# Patient Record
Sex: Female | Born: 1940 | Race: White | Hispanic: No | Marital: Married | Smoking: Former smoker
Health system: Southern US, Community
[De-identification: ages and names within clinical notes are randomized; demographics above are authoritative.]

## PROBLEM LIST (undated history)

## (undated) DIAGNOSIS — R12 Heartburn: Secondary | ICD-10-CM

## (undated) DIAGNOSIS — C55 Malignant neoplasm of uterus, part unspecified: Secondary | ICD-10-CM

## (undated) DIAGNOSIS — E785 Hyperlipidemia, unspecified: Secondary | ICD-10-CM

## (undated) DIAGNOSIS — K635 Polyp of colon: Secondary | ICD-10-CM

## (undated) DIAGNOSIS — G4730 Sleep apnea, unspecified: Secondary | ICD-10-CM

## (undated) DIAGNOSIS — I10 Essential (primary) hypertension: Secondary | ICD-10-CM

## (undated) DIAGNOSIS — K5790 Diverticulosis of intestine, part unspecified, without perforation or abscess without bleeding: Secondary | ICD-10-CM

## (undated) DIAGNOSIS — Z9889 Other specified postprocedural states: Secondary | ICD-10-CM

## (undated) DIAGNOSIS — R112 Nausea with vomiting, unspecified: Secondary | ICD-10-CM

## (undated) DIAGNOSIS — M1990 Unspecified osteoarthritis, unspecified site: Secondary | ICD-10-CM

## (undated) DIAGNOSIS — K259 Gastric ulcer, unspecified as acute or chronic, without hemorrhage or perforation: Secondary | ICD-10-CM

## (undated) HISTORY — PX: DUPUYTREN CONTRACTURE RELEASE: SHX1478

## (undated) HISTORY — PX: KNEE ARTHROSCOPY: SUR90

## (undated) HISTORY — PX: ABDOMINAL HYSTERECTOMY: SHX81

## (undated) HISTORY — DX: Hyperlipidemia, unspecified: E78.5

## (undated) HISTORY — DX: Malignant neoplasm of uterus, part unspecified: C55

## (undated) HISTORY — DX: Diverticulosis of intestine, part unspecified, without perforation or abscess without bleeding: K57.90

## (undated) HISTORY — PX: HEMORRHOID SURGERY: SHX153

## (undated) HISTORY — PX: COLONOSCOPY W/ POLYPECTOMY: SHX1380

## (undated) HISTORY — DX: Gastric ulcer, unspecified as acute or chronic, without hemorrhage or perforation: K25.9

## (undated) HISTORY — DX: Polyp of colon: K63.5

---

## 1993-06-20 DIAGNOSIS — C55 Malignant neoplasm of uterus, part unspecified: Secondary | ICD-10-CM

## 1993-06-20 HISTORY — DX: Malignant neoplasm of uterus, part unspecified: C55

## 1999-02-11 ENCOUNTER — Other Ambulatory Visit: Admission: RE | Admit: 1999-02-11 | Discharge: 1999-02-11 | Payer: Self-pay | Admitting: *Deleted

## 1999-11-29 ENCOUNTER — Other Ambulatory Visit: Admission: RE | Admit: 1999-11-29 | Discharge: 1999-11-29 | Payer: Self-pay | Admitting: *Deleted

## 2001-04-09 ENCOUNTER — Other Ambulatory Visit: Admission: RE | Admit: 2001-04-09 | Discharge: 2001-04-09 | Payer: Self-pay | Admitting: *Deleted

## 2001-07-26 ENCOUNTER — Ambulatory Visit (HOSPITAL_BASED_OUTPATIENT_CLINIC_OR_DEPARTMENT_OTHER): Admission: RE | Admit: 2001-07-26 | Discharge: 2001-07-26 | Payer: Self-pay | Admitting: Otolaryngology

## 2001-11-20 ENCOUNTER — Ambulatory Visit (HOSPITAL_BASED_OUTPATIENT_CLINIC_OR_DEPARTMENT_OTHER): Admission: RE | Admit: 2001-11-20 | Discharge: 2001-11-20 | Payer: Self-pay | Admitting: Otolaryngology

## 2001-12-14 ENCOUNTER — Encounter (INDEPENDENT_AMBULATORY_CARE_PROVIDER_SITE_OTHER): Payer: Self-pay | Admitting: Specialist

## 2001-12-14 ENCOUNTER — Ambulatory Visit (HOSPITAL_COMMUNITY): Admission: RE | Admit: 2001-12-14 | Discharge: 2001-12-14 | Payer: Self-pay | Admitting: Gastroenterology

## 2002-04-03 ENCOUNTER — Other Ambulatory Visit: Admission: RE | Admit: 2002-04-03 | Discharge: 2002-04-03 | Payer: Self-pay | Admitting: *Deleted

## 2002-10-03 ENCOUNTER — Encounter: Admission: RE | Admit: 2002-10-03 | Discharge: 2002-10-03 | Payer: Self-pay | Admitting: Chiropractic Medicine

## 2002-10-03 ENCOUNTER — Encounter: Payer: Self-pay | Admitting: Chiropractic Medicine

## 2006-04-26 ENCOUNTER — Ambulatory Visit: Payer: Self-pay | Admitting: Gastroenterology

## 2006-09-05 ENCOUNTER — Other Ambulatory Visit: Admission: RE | Admit: 2006-09-05 | Discharge: 2006-09-05 | Payer: Self-pay | Admitting: Internal Medicine

## 2009-11-05 ENCOUNTER — Encounter (INDEPENDENT_AMBULATORY_CARE_PROVIDER_SITE_OTHER): Payer: Self-pay | Admitting: *Deleted

## 2009-11-26 ENCOUNTER — Encounter (INDEPENDENT_AMBULATORY_CARE_PROVIDER_SITE_OTHER): Payer: Self-pay

## 2009-12-01 ENCOUNTER — Ambulatory Visit: Payer: Self-pay | Admitting: Gastroenterology

## 2009-12-08 ENCOUNTER — Ambulatory Visit: Payer: Self-pay | Admitting: Gastroenterology

## 2010-07-20 NOTE — Letter (Signed)
Summary: Previsit letter  [facility]  [address] [place], [place] [address]   Phone: [phone]  Fax: [phone]       11/05/2009 MRN: [id number]  Rhonda Gregory [address] [place], [place]  [address]  Dear [name],  Welcome to the Gastroenterology Division at [facility].    You are scheduled to see a nurse for your pre-procedure visit on 11-23-09 at 11am on the 3rd floor at [facility], [address]  We ask that you try to arrive at our office 15 minutes prior to your appointment time to allow for check-in.  Your nurse visit will consist of discussing your medical and surgical history, your immediate family medical history, and your medications.    Please bring a complete list of all your medications or, if you prefer, bring the medication bottles and we will list them.  We will need to be aware of both prescribed and over the counter drugs.  We will need to know exact dosage information as well.  If you are on blood thinners (Coumadin, Plavix, Aggrenox, Ticlid, etc.) please call our office today/prior to your appointment, as we need to consult with your physician about holding your medication.   Please be prepared to read and sign documents such as consent forms, a financial agreement, and acknowledgement forms.  If necessary, and with your consent, a friend or relative is welcome to sit-in on the nurse visit with you.  Please bring your insurance card so that we may make a copy of it.  If your insurance requires a referral to see a specialist, please bring your referral form from your primary care physician.  No co-pay is required for this nurse visit.     If you cannot keep your appointment, please call [phone] to cancel or reschedule prior to your appointment date.  This allows [place] the opportunity to schedule an appointment for another patient in need of care.    Thank you for choosing [facility] for your medical needs.  We  appreciate the opportunity to care for you.  Please visit [place] at our website  to learn more about our practice.                     Sincerely.                                                                                                                   The Gastroenterology Division

## 2010-07-20 NOTE — Procedures (Signed)
Summary: Colonoscopy  Patient: Rhonda Gregory Note: All result statuses are Final unless otherwise noted.  Tests: (1) Colonoscopy (COL)   COL Colonoscopy           DONE     [facility]     [address]     [place], [place]  [address]           COLONOSCOPY PROCEDURE REPORT           PATIENT:  Rhonda Gregory, Rhonda Gregory  MR#:  [id number]     BIRTHDATE:  08-12-1940, 68 yrs. old  GENDER:  female     ENDOSCOPIST:  Vania Rea. Jarold Motto, MD, [facility]     REF. BY:     PROCEDURE DATE:  12/08/2009     PROCEDURE:  Higher-risk screening colonoscopy G0105           ASA CLASS:  Class II     INDICATIONS:  colorectal cancer screening, average risk, family     history of colon cancer     MEDICATIONS:   Zofran 4 mg IV, Fentanyl 50 mcg IV, Versed 4 mg IV           DESCRIPTION OF PROCEDURE:   After the risks benefits and     alternatives of the procedure were thoroughly explained, informed     consent was obtained.  Digital rectal exam was performed and     revealed no abnormalities.   The LB CF-[id number] endoscope     was introduced through the anus and advanced to the cecum, which     was identified by both the appendix and ileocecal valve, without     limitations.  The quality of the prep was excellent, using     MoviPrep.  The instrument was then slowly withdrawn as the colon     was fully examined.     <<PROCEDUREIMAGES>>           FINDINGS:  Moderate diverticulosis was found in the sigmoid to     descending colon segments.  No polyps or cancers were seen.  This     was otherwise a normal examination of the colon.   Retroflexed     views in the rectum revealed no abnormalities.    The scope was     then withdrawn from the patient and the procedure completed.           COMPLICATIONS:  None     ENDOSCOPIC IMPRESSION:     1) Moderate diverticulosis in the sigmoid to descending colon     segments     2) No polyps or cancers     3) Otherwise normal examination     RECOMMENDATIONS:     1) high  fiber diet     2) Repeat Colonoscopy in 5 years.     REPEAT EXAM:  No           ______________________________     Vania Rea. Jarold Motto, MD, [facility]           CC:           n.     eSIGNED:   Vania Rea. Patterson at 12/08/2009 12:39 PM           Joanette Gula, [id number]  Note: An exclamation mark (!) indicates a result that was not dispersed into the flowsheet. Document Creation Date: 12/08/2009 12:40 PM _______________________________________________________________________  (1) Order result status: Final Collection or observation date-time: 12/08/2009 12:33 Requested date-time:  Receipt date-time:  Reported  date-time:  Referring Physician:   Ordering Physician: Sheryn Bison [phone]) Specimen Source:  Source: Launa Grill Order Number: [id number] Lab site:   Appended Document: Colonoscopy    Clinical Lists Changes  Observations: Added new observation of COLONNXTDUE: 11/2014 (12/08/2009 14:16)

## 2010-07-20 NOTE — Miscellaneous (Signed)
Summary: Lec previsit  Clinical Lists Changes  Medications: Added new medication of MOVIPREP 100 GM  SOLR (PEG-KCL-NACL-NASULF-NA ASC-C) As per prep instructions. - Signed Rx of MOVIPREP 100 GM  SOLR (PEG-KCL-NACL-NASULF-NA ASC-C) As per prep instructions.;  #1 x 0;  Signed;  Entered by: Ulis Rias RN;  Authorized by: Mardella Layman MD [facility];  Method used: Electronically to [facility] [facility] [id number]*, [address] [place], [place], [place]  [address], Ph: [phone] or [phone], Fax: [phone] Allergies: Added new allergy or adverse reaction of CODEINE Observations: Added new observation of NKA: F (12/01/2009 7:51)    Prescriptions: MOVIPREP 100 GM  SOLR (PEG-KCL-NACL-NASULF-NA ASC-C) As per prep instructions.  #1 x 0   Entered by:   Ulis Rias RN   Authorized by:   Mardella Layman MD [facility]   Signed by:   Ulis Rias RN on 12/01/2009   Method used:   Electronically to        [facility] [address] [id number]* (retail)       [address]       [place], [place]  [address]       Ph: [phone] or [phone]       Fax: [phone]   RxID:   [id number]

## 2010-07-20 NOTE — Letter (Signed)
Summary: [facility] Instructions  [facility]  [address] [place], [place] [address]   Phone: [phone]  Fax: [phone]       Rhonda Gregory    Mar 10, 1941    MRN: [id number]        Procedure Day Dorna Bloom:  Jake Shark  12/08/09     Arrival Time:  10:30AM     Procedure Time:  11:30AM     Location of Procedure:                    _ X_  [facility] (4th Floor)                       PREPARATION FOR COLONOSCOPY WITH MOVIPREP   Starting 5 days prior to your procedure 12/03/09 do not eat nuts, seeds, popcorn, corn, beans, peas,  salads, or any raw vegetables.  Do not take any fiber supplements (e.g. Metamucil, Citrucel, and Benefiber).  THE DAY BEFORE YOUR PROCEDURE         DATE: 12/07/09  DAY: MONDAY  1.  Drink clear liquids the entire day-NO SOLID FOOD  2.  Do not drink anything colored red or purple.  Avoid juices with pulp.  No orange juice.  3.  Drink at least 64 oz. (8 glasses) of fluid/clear liquids during the day to prevent dehydration and help the prep work efficiently.  CLEAR LIQUIDS INCLUDE: Water Jello Ice Popsicles Tea (sugar ok, no milk/cream) Powdered fruit flavored drinks Coffee (sugar ok, no milk/cream) Gatorade Juice: apple, white grape, white cranberry  Lemonade Clear bullion, consomm, broth Carbonated beverages (any kind) Strained chicken noodle soup Hard Candy                             4.  In the morning, mix first dose of MoviPrep solution:    Empty 1 Pouch A and 1 Pouch B into the disposable container    Add lukewarm drinking water to the top line of the container. Mix to dissolve    Refrigerate (mixed solution should be used within 24 hrs)  5.  Begin drinking the prep at 5:00 p.m. The MoviPrep container is divided by 4 marks.   Every 15 minutes drink the solution down to the next mark (approximately 8 oz) until the full liter is complete.   6.  Follow completed prep with 16 oz of clear liquid of your choice (Nothing  red or purple).  Continue to drink clear liquids until bedtime.  7.  Before going to bed, mix second dose of MoviPrep solution:    Empty 1 Pouch A and 1 Pouch B into the disposable container    Add lukewarm drinking water to the top line of the container. Mix to dissolve    Refrigerate  THE DAY OF YOUR PROCEDURE      DATE: 12/08/09   DAY: TUESDAY  Beginning at 6:30AM (5 hours before procedure):         1. Every 15 minutes, drink the solution down to the next mark (approx 8 oz) until the full liter is complete.  2. Follow completed prep with 16 oz. of clear liquid of your choice.    3. You may drink clear liquids until 9:30AM (2 HOURS BEFORE PROCEDURE).   MEDICATION INSTRUCTIONS  Unless otherwise instructed, you should take regular prescription medications with a small sip of water   as early as possible the morning  of your procedure.         OTHER INSTRUCTIONS  You will need a responsible adult at least 70 years of age to accompany you and drive you home.   This person must remain in the waiting room during your procedure.  Wear loose fitting clothing that is easily removed.  Leave jewelry and other valuables at home.  However, you may wish to bring a book to read or  an iPod/MP3 player to listen to music as you wait for your procedure to start.  Remove all body piercing jewelry and leave at home.  Total time from sign-in until discharge is approximately 2-3 hours.  You should go home directly after your procedure and rest.  You can resume normal activities the  day after your procedure.  The day of your procedure you should not:   Drive   Make legal decisions   Operate machinery   Drink alcohol   Return to work  You will receive specific instructions about eating, activities and medications before you leave.    The above instructions have been reviewed and explained to me by   Ulis Rias RN  December 01, 2009 8:13 AM     I fully understand and can  verbalize these instructions _____________________________ Date _________

## 2010-11-05 NOTE — Op Note (Signed)
[place]  Patient:    Rhonda Gregory, Rhonda Gregory Visit Number: [id number] MRN: [id number]          Service Type: DSU Location: [facility] Attending Physician:  Corie Chiquito Dictated by:   Margit Banda. Jearld Fenton, M.D. Proc. Date: 07/26/01 Admit Date:  07/26/2001 Discharge Date: 07/26/2001                             Operative Report  PREOPERATIVE DIAGNOSIS:  Deviated septum and turbinate hypertrophy.  POSTOPERATIVE DIAGNOSIS:  Deviated septum and turbinate hypertrophy.  SURGICAL PROCEDURE:  Septoplasty and submucous resection or inferior turbinates.  ANESTHESIA:  General endotracheal tube.  ESTIMATED BLOOD LOSS:  Approximately 25 cc.  INDICATIONS:  This is a 70 year old, who has had a chronic nasal obstruction problem that has been going on for many years.  She has tried nasal steroid sprays, which have not significantly improved her symptoms.  She had a nasal fracture injury in October 2002 and after that, her nose became more obstructed.  She now wants to proceed with a septoplasty and turbinate reduction.  She was informed of the risks and benefits of the procedure, including bleeding, infection, chronic crusting and drying, perforation, numbness of her teeth, change in the external appearance of the nose, and risk of the anesthetic.  All questions were answered and consent was obtained.  DESCRIPTION OF PROCEDURE:  The patient was taken to the operating room and placed in supine position.  After adequate general endotracheal tube anesthesia, was placed in a supine position, prepped and draped in the usual sterile manner.  The left hemitransfixion incision was performed raising a mucoperichondrial and ostial flap.  The septum was substantially deviated to the left.  In fact, the septum was actually scarred to the left lateral aspect of the nasal cavity.  The flap was elevated carefully and did put a rent in the mucosal flap posteriorly, where the scar  tissue was on the lateral wall. This was broken apart.  This was posterior to the middle turbinate.  The cartilage was divided about 2 cm posterior to the caudal strut and this cartilage was removed with a Therapist, nutritional.  The inferior cartilaginous spurs were removed with the Therapist, nutritional.  The bone was removed with the Jansen-Middleton forceps.  This appeared to correct the septal deflection very nicely.  The turbinates were then in-fractured and midline incision was made with a mucosal flap elevated superiorly.  This was performed with a Therapist, nutritional.  The inferior mucosa and bone were removed with turbinate scissors. The edge was cauterized with suction cautery.  The flap was laid back down over the raw surface and the turbinates were out-fractured bilaterally.  The hemitransfixion incision was closed with interrupted 4-0 chromic and  4-0 plain gut quilting stitch placed through the septum.  Telfa rolled soaked in bacitracin were placed into the nose bilaterally and secured with a 3-0 nylon. The nasopharynx was suctioned out of all blood and debris prior to this insertion.  The patient was awakened and brought to recovery room in stable condition.  Counts correct. Dictated by:   Margit Banda. Jearld Fenton, M.D. Attending Physician:  Corie Chiquito DD:  07/26/01 TD:  07/26/01 Job: [id number]

## 2010-11-05 NOTE — Assessment & Plan Note (Signed)
[name]                           GASTROENTEROLOGY OFFICE NOTE   Rhonda Gregory Rhonda Gregory                        MRN:          [id number]  DATE:04/26/2006                            DOB:          [birth year]    Patient comes in complaining of stomach pain, especially when she is hungry.  She says she keeps eating, says because it seems to alleviate her symptoms.  She says it is epigastric pain.  She does take some Rolaids, which has  helped a little.  She says she has been watching her diet, but it still has  not improved.  It is interesting, she says was on some Flexeril for several  months during the summer, taking it every day for muscle cramps and pains.  She also does not notice any pain after eating, no belching or burping as  one would suspect from gallbladder disease.  Her only other problem has been  history of some ulcers and colon polyps, and some hemorrhoids.  No other  problems at this time.  She has had an upper endoscopic examination by  myself, she says over 20 years ago, and colonoscopic examination  approximately 3-1/2 years ago by Dr. Kinnie Gregory.  This showed a few small  polyps.  It should be noted that her mother underwent this and had colon  cancer and colon polyps.  She also had ulcerative colitis.   PAST MEDICAL HISTORY:  Reveals some hypertension, uterine cancer.  She does  have sleep apnea.  She has had M-mode surgery and hysterectomy.   SOCIAL HISTORY:  Essentially noncontributory except that she does drink  occasionally.  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   FAMILY HISTORY:  Otherwise, noncontributory.  Mother did have a bleeding  disorder, and her father heart disease and diabetes.  Mother had breast  cancer as well as colon cancer.   REVIEW OF SYMPTOMS:  Reveals some back pain and arthritic pain at times.   PHYSICAL EXAMINATION:  Healthy-appearing lady, 5 foot 2 inches.  Weighs 149.  Blood  pressure 122/64.  Pulse of 78 and regular.  Oropharynx negative.  NECK:  Negative.  CHEST:  Clear.  HEART:  Reveals a regular rhythm without significant murmur.  Supraclavicular area was negative.  Thyroid was negative to palpation.  ABDOMEN:  Soft.  Flat.  No masses or organomegaly, and non-tender.  There  was no bruits or rubs.  There were no inguinal nodes.   IMPRESSION:  1. Epigastric pain, questionable etiology, possibly peptic ulcer disease.  2. History of diverticular disease.  3. Hypertension.  4. Status post uterine cancer.  5. Status post hysterectomy.  6. Status post hemorrhoidectomy.   RECOMMENDATIONS:  1. She will be started on Prevacid 30 mg b.i.d.  2. Get labs as per Dr. Elisabeth Gregory.  3. If she is not any better over the next 10 days to 2 weeks, I think an      ultrasound of her abdomen would be in order, although this is unlikely      that she has any gallbladder disease or  any other problem besides the      suspected peptic ulcer disease or gastritis.  I think that upper      endoscopy would be in order if her symptoms persisted.  Hopefully, this      will not be necessary.     Rhonda Mort, MD  Electronically Signed    SML/MedQ  DD: 04/26/2006  DT: 04/27/2006  Job #: [id number]   cc:   Rhonda Gregory, D.O.

## 2010-12-01 ENCOUNTER — Other Ambulatory Visit (HOSPITAL_COMMUNITY): Payer: Self-pay | Admitting: Internal Medicine

## 2010-12-01 ENCOUNTER — Ambulatory Visit (HOSPITAL_COMMUNITY)
Admission: RE | Admit: 2010-12-01 | Discharge: 2010-12-01 | Disposition: A | Discharge status: Home / Self Care | Payer: Medicare Other | Source: Ambulatory Visit | Attending: Internal Medicine | Admitting: Internal Medicine

## 2010-12-01 DIAGNOSIS — I10 Essential (primary) hypertension: Secondary | ICD-10-CM | POA: Insufficient documentation

## 2010-12-01 DIAGNOSIS — Z0000 Encounter for general adult medical examination without abnormal findings: Secondary | ICD-10-CM | POA: Insufficient documentation

## 2012-06-25 ENCOUNTER — Ambulatory Visit: Payer: Self-pay | Admitting: Physical Therapy

## 2012-06-25 ENCOUNTER — Ambulatory Visit: Payer: Medicare Other | Attending: Orthopedic Surgery | Admitting: Physical Therapy

## 2012-06-25 DIAGNOSIS — M25569 Pain in unspecified knee: Secondary | ICD-10-CM | POA: Insufficient documentation

## 2012-06-25 DIAGNOSIS — IMO0001 Reserved for inherently not codable concepts without codable children: Secondary | ICD-10-CM | POA: Insufficient documentation

## 2012-06-28 ENCOUNTER — Ambulatory Visit: Payer: Medicare Other | Admitting: Physical Therapy

## 2012-06-28 ENCOUNTER — Encounter: Payer: Self-pay | Admitting: Physical Therapy

## 2012-07-02 ENCOUNTER — Ambulatory Visit: Payer: Medicare Other | Admitting: Physical Therapy

## 2012-07-02 ENCOUNTER — Encounter: Payer: Self-pay | Admitting: Physical Therapy

## 2013-07-24 ENCOUNTER — Other Ambulatory Visit: Payer: Self-pay | Admitting: Surgical

## 2013-07-24 NOTE — H&P (Signed)
TOTAL KNEE ADMISSION H&P  Patient is being admitted for right total knee arthroplasty.  Subjective:  Chief Complaint:right knee pain.  HPI: Rhonda Gregory, 73 y.o. female, has a history of pain and functional disability in the right knee due to arthritis and has failed non-surgical conservative treatments for greater than 12 weeks to includeNSAID's and/or analgesics, corticosteriod injections, viscosupplementation injections and activity modification.  Onset of symptoms was gradual, starting >10 years ago with gradually worsening course since that time. The patient noted prior procedures on the knee to include  arthroscopy and menisectomy on the right knee(s).  Patient currently rates pain in the right knee(s) at 7 out of 10 with activity. Patient has night pain, worsening of pain with activity and weight bearing, pain that interferes with activities of daily living, pain with passive range of motion, crepitus and joint swelling.  Patient has evidence of subchondral cysts, subchondral sclerosis, periarticular osteophytes, joint subluxation and joint space narrowing by imaging studies.  There is no active infection.  Past Medical History Sleep Apnea HTN Measles Uterine cancer Osteoarthritis  Past Surgical History Hysterectomy. complete (cancerous) Straighten Nasal Septum Arthroscopy of Knee. right Hemorrhoidectomy Foot Surgery. left  Medication History Lisinopril-Hydrochlorothiazide (20-12.5MG  Tablet, Oral) Active. Simvastatin (20MG  Tablet, Oral) Active. Zolpidem Tartrate (10MG  Tablet, Oral) Active. (only on occasion) Meloxicam (7.5MG  Tablet, Oral) Active.   Allergies  Allergen Reactions  . Codeine     REACTION: nauseated    History  Substance Use Topics  . Smoking status: Not on file  . Smokeless tobacco: Not on file  . Alcohol Use: Not on file    Family History Hypertension. father and brother Heart disease in female family member before age 36 Heart Disease.  father Congestive Heart Failure. father and grandfather mothers side Cancer. mother and grandmother mothers side Drug / Alcohol Addiction. mother Depression. mother and grandmother mothers side   Review of Systems  Constitutional: Negative.   HENT: Negative.   Eyes: Negative.   Respiratory: Positive for cough. Negative for hemoptysis, sputum production, shortness of breath and wheezing.   Cardiovascular: Negative.   Gastrointestinal: Positive for diarrhea. Negative for heartburn, nausea, vomiting, abdominal pain, constipation, blood in stool and melena.  Genitourinary: Negative.   Musculoskeletal: Positive for joint pain. Negative for back pain, falls, myalgias and neck pain.       Right knee pain  Skin: Negative.   Neurological: Negative.   Endo/Heme/Allergies: Negative.   Psychiatric/Behavioral: Negative.     Objective:  Physical Exam  Constitutional: She is oriented to person, place, and time. She appears well-developed and well-nourished. No distress.  HENT:  Head: Normocephalic and atraumatic.  Right Ear: External ear normal.  Left Ear: External ear normal.  Nose: Nose normal.  Mouth/Throat: Oropharynx is clear and moist.  Eyes: Conjunctivae and EOM are normal.  Neck: Normal range of motion. Neck supple.  Cardiovascular: Normal rate, regular rhythm, normal heart sounds and intact distal pulses.   Respiratory: Effort normal and breath sounds normal. No respiratory distress. She has no wheezes.  GI: Soft. Bowel sounds are normal. She exhibits no distension. There is no tenderness.  Musculoskeletal:       Right hip: Normal.       Left hip: Normal.       Right knee: She exhibits decreased range of motion and swelling. She exhibits no effusion and no erythema. Tenderness found. Medial joint line and lateral joint line tenderness noted.       Left knee: She exhibits normal range of motion,  no swelling and no erythema. Tenderness found. Medial joint line tenderness noted.  No lateral joint line tenderness noted.       Right lower leg: She exhibits no tenderness and no swelling.       Left lower leg: She exhibits no tenderness and no swelling.   Her right knee shows no effusion. Range is about 5 to 125 with marked crepitus on range of motion. She is tender medial greater than lateral with no instability.  Neurological: She is alert and oriented to person, place, and time. She has normal strength and normal reflexes. No sensory deficit.  Skin: No rash noted. She is not diaphoretic. No erythema.  Psychiatric: She has a normal mood and affect. Her behavior is normal.     Vitals Weight: 147 lb Height: 61 in Body Surface Area: 1.69 m Body Mass Index: 27.78 kg/m Pulse: 80 (Regular) BP: 112/74 (Sitting, Left Arm, Standard)  Imaging Review Plain radiographs demonstrate severe degenerative joint disease of the right knee(s). The overall alignment ismild varus. The bone quality appears to be good for age and reported activity level.  Assessment/Plan:  End stage arthritis, right knee   The patient history, physical examination, clinical judgment of the provider and imaging studies are consistent with end stage degenerative joint disease of the right knee(s) and total knee arthroplasty is deemed medically necessary. The treatment options including medical management, injection therapy arthroscopy and arthroplasty were discussed at length. The risks and benefits of total knee arthroplasty were presented and reviewed. The risks due to aseptic loosening, infection, stiffness, patella tracking problems, thromboembolic complications and other imponderables were discussed. The patient acknowledged the explanation, agreed to proceed with the plan and consent was signed. Patient is being admitted for inpatient treatment for surgery, pain control, PT, OT, prophylactic antibiotics, VTE prophylaxis, progressive ambulation and ADL's and discharge planning. The patient is  planning to be discharged home with home health services    [place], [place]

## 2013-08-02 ENCOUNTER — Other Ambulatory Visit: Payer: Self-pay | Admitting: Orthopedic Surgery

## 2013-08-05 ENCOUNTER — Encounter (HOSPITAL_COMMUNITY): Payer: Self-pay | Admitting: Pharmacy Technician

## 2013-08-07 ENCOUNTER — Other Ambulatory Visit (HOSPITAL_COMMUNITY): Payer: Self-pay | Admitting: Orthopedic Surgery

## 2013-08-07 NOTE — Progress Notes (Signed)
EKG 2/15 chart

## 2013-08-07 NOTE — Patient Instructions (Signed)
      Your procedure is scheduled on: 08/12/13  MONDAY   Report to [facility] at   [place]    AM.  Call this number if you have problems the morning of surgery: [place]   Do not eat food After Midnight. Sunday NIGHT-- MAY HAVE CLEAR LIQUIDS Monday MORNING UNTIL 0640 AM-- THEN NOTHING BY MOUTH   Take these medicines the morning of surgery with A SIP OF WATER: NONE   .  Contacts, dentures or partial plates, or metal hairpins  can not be worn to surgery. Your family will be responsible for glasses, dentures, hearing aides while you are in surgery  Leave suitcase in the car. After surgery it may be brought to your room.  For patients admitted to the hospital, checkout time is 11:00 AM day of  discharge.                DO NOT WEAR JEWELRY, LOTIONS, POWDERS, OR PERFUMES.  WOMEN-- DO NOT SHAVE LEGS OR UNDERARMS FOR 48 HOURS BEFORE SHOWERS. MEN MAY SHAVE FACE.  Patients discharged the day of surgery will not be allowed to drive home. IF going home the day of surgery, you must have a driver and someone to stay with you for the first 24 hours  Name and phone number of your driver:    ADMISSION                                                                    Please read over the following fact sheets that you were given: MRSA Information, Incentive Spirometry Sheet, Blood Transfusion Sheet  Information                     FAILURE TO [place]                                                  Patient Signature _____________________________

## 2013-08-08 ENCOUNTER — Other Ambulatory Visit: Payer: Self-pay | Admitting: Surgical

## 2013-08-08 ENCOUNTER — Encounter (HOSPITAL_COMMUNITY): Payer: Self-pay

## 2013-08-08 ENCOUNTER — Encounter (HOSPITAL_COMMUNITY)
Admission: RE | Admit: 2013-08-08 | Discharge: 2013-08-08 | Disposition: A | Discharge status: Home / Self Care | Payer: Medicare HMO | Source: Ambulatory Visit | Attending: Orthopedic Surgery | Admitting: Orthopedic Surgery

## 2013-08-08 ENCOUNTER — Ambulatory Visit (HOSPITAL_COMMUNITY)
Admission: RE | Admit: 2013-08-08 | Discharge: 2013-08-08 | Disposition: A | Discharge status: Home / Self Care | Payer: Medicare HMO | Source: Ambulatory Visit | Attending: Orthopedic Surgery | Admitting: Orthopedic Surgery

## 2013-08-08 DIAGNOSIS — Z01812 Encounter for preprocedural laboratory examination: Secondary | ICD-10-CM | POA: Insufficient documentation

## 2013-08-08 DIAGNOSIS — M25519 Pain in unspecified shoulder: Secondary | ICD-10-CM | POA: Insufficient documentation

## 2013-08-08 DIAGNOSIS — R079 Chest pain, unspecified: Secondary | ICD-10-CM | POA: Insufficient documentation

## 2013-08-08 DIAGNOSIS — W19XXXA Unspecified fall, initial encounter: Secondary | ICD-10-CM | POA: Insufficient documentation

## 2013-08-08 HISTORY — DX: Heartburn: R12

## 2013-08-08 HISTORY — DX: Other specified postprocedural states: Z98.890

## 2013-08-08 HISTORY — DX: Other specified postprocedural states: R11.2

## 2013-08-08 HISTORY — DX: Essential (primary) hypertension: I10

## 2013-08-08 HISTORY — DX: Sleep apnea, unspecified: G47.30

## 2013-08-08 HISTORY — DX: Unspecified osteoarthritis, unspecified site: M19.90

## 2013-08-08 LAB — CBC
HCT: 41.6 % (ref 36.0–46.0)
Hemoglobin: 13.5 g/dL (ref 12.0–15.0)
MCH: 29.5 pg (ref 26.0–34.0)
MCHC: 32.5 g/dL (ref 30.0–36.0)
MCV: 90.8 fL (ref 78.0–100.0)
PLATELETS: 268 10*3/uL (ref 150–400)
RBC: 4.58 MIL/uL (ref 3.87–5.11)
RDW: 13.1 % (ref 11.5–15.5)
WBC: 11.1 10*3/uL — AB (ref 4.0–10.5)

## 2013-08-08 LAB — SURGICAL PCR SCREEN
MRSA, PCR: NEGATIVE
Staphylococcus aureus: NEGATIVE

## 2013-08-08 LAB — COMPREHENSIVE METABOLIC PANEL
ALBUMIN: 4.3 g/dL (ref 3.5–5.2)
ALK PHOS: 92 U/L (ref 39–117)
ALT: 29 U/L (ref 0–35)
AST: 24 U/L (ref 0–37)
BUN: 23 mg/dL (ref 6–23)
CHLORIDE: 99 meq/L (ref 96–112)
CO2: 28 mEq/L (ref 19–32)
CREATININE: 0.67 mg/dL (ref 0.50–1.10)
Calcium: 10.7 mg/dL — ABNORMAL HIGH (ref 8.4–10.5)
GFR calc Af Amer: >90 mL/min (ref >90)
GFR calc non Af Amer: 86 mL/min — ABNORMAL LOW (ref >90)
Glucose, Bld: 89 mg/dL (ref 70–99)
POTASSIUM: 4.1 meq/L (ref 3.7–5.3)
SODIUM: 140 meq/L (ref 137–147)
Total Bilirubin: 0.2 mg/dL — ABNORMAL LOW (ref 0.3–1.2)
Total Protein: 8 g/dL (ref 6.0–8.3)

## 2013-08-08 LAB — PROTIME-INR
INR: 0.91 (ref 0.00–1.49)
Prothrombin Time: 12.1 seconds (ref 11.6–15.2)

## 2013-08-08 LAB — URINALYSIS, ROUTINE W REFLEX MICROSCOPIC
Bilirubin Urine: NEGATIVE
Glucose, UA: NEGATIVE mg/dL
Hgb urine dipstick: NEGATIVE
KETONES UR: NEGATIVE mg/dL
LEUKOCYTES UA: NEGATIVE
NITRITE: NEGATIVE
Protein, ur: NEGATIVE mg/dL
Specific Gravity, Urine: 1.024 (ref 1.005–1.030)
Urobilinogen, UA: 0.2 mg/dL (ref 0.0–1.0)
pH: 5.5 (ref 5.0–8.0)

## 2013-08-08 LAB — ABO/RH: ABO/RH(D): B POS

## 2013-08-08 LAB — APTT: APTT: 27 s (ref 24–37)

## 2013-08-08 NOTE — Progress Notes (Signed)
Late note for PST VISIT-  C/o left shoulder, neck, upper arm pain following fall 1 1/2 weeks ago in [place]. States fell on concrete. Old bruising noted anterior chest only. Able to raise arm to head- states is very tender. Used ice this am.  States began yesterday and she called Dr Tresa Moore office. She and husband want x ray done at PST inspite of my instructions that I can not do this without order, and she may need to be seen by PCP, and then if abnormal, let Dr Wynelle Link know for follow up.  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 Explained to him after I evaluated his wife, and obtained some history from her(as no history in EPIC other than meds) that I will take care of his wife.  After speaking with Faith at Dr Tresa Moore office, she stated that Dr Wynelle Link had received the message and a shoulder x ray can be done today and order will be placed.  Husband and wife appeared happier after finding out x ray would be done.

## 2013-08-08 NOTE — Progress Notes (Signed)
OV with clearance Dr Virgina Jock 2/15 chart

## 2013-08-12 ENCOUNTER — Inpatient Hospital Stay (HOSPITAL_COMMUNITY): Payer: Medicare HMO | Admitting: Anesthesiology

## 2013-08-12 ENCOUNTER — Encounter (HOSPITAL_COMMUNITY)
Admission: RE | Disposition: A | Discharge status: Home Health | Payer: Self-pay | Source: Ambulatory Visit | Attending: Orthopedic Surgery

## 2013-08-12 ENCOUNTER — Encounter (HOSPITAL_COMMUNITY): Payer: Self-pay

## 2013-08-12 ENCOUNTER — Inpatient Hospital Stay (HOSPITAL_COMMUNITY)
Admission: RE | Admit: 2013-08-12 | Discharge: 2013-08-14 | DRG: 470 | Disposition: A | Discharge status: Home Health | Payer: Medicare HMO | Source: Ambulatory Visit | Attending: Orthopedic Surgery | Admitting: Orthopedic Surgery

## 2013-08-12 ENCOUNTER — Encounter (HOSPITAL_COMMUNITY): Payer: Medicare HMO | Admitting: Anesthesiology

## 2013-08-12 DIAGNOSIS — I10 Essential (primary) hypertension: Secondary | ICD-10-CM | POA: Diagnosis present

## 2013-08-12 DIAGNOSIS — G4730 Sleep apnea, unspecified: Secondary | ICD-10-CM | POA: Diagnosis present

## 2013-08-12 DIAGNOSIS — D62 Acute posthemorrhagic anemia: Secondary | ICD-10-CM

## 2013-08-12 DIAGNOSIS — Z96651 Presence of right artificial knee joint: Secondary | ICD-10-CM

## 2013-08-12 DIAGNOSIS — Z8249 Family history of ischemic heart disease and other diseases of the circulatory system: Secondary | ICD-10-CM

## 2013-08-12 DIAGNOSIS — Z79899 Other long term (current) drug therapy: Secondary | ICD-10-CM

## 2013-08-12 DIAGNOSIS — Z87891 Personal history of nicotine dependence: Secondary | ICD-10-CM

## 2013-08-12 DIAGNOSIS — M179 Osteoarthritis of knee, unspecified: Secondary | ICD-10-CM

## 2013-08-12 DIAGNOSIS — Z791 Long term (current) use of non-steroidal anti-inflammatories (NSAID): Secondary | ICD-10-CM

## 2013-08-12 DIAGNOSIS — M1710 Unilateral primary osteoarthritis, unspecified knee: Principal | ICD-10-CM | POA: Diagnosis present

## 2013-08-12 DIAGNOSIS — Z8542 Personal history of malignant neoplasm of other parts of uterus: Secondary | ICD-10-CM

## 2013-08-12 HISTORY — PX: TOTAL KNEE ARTHROPLASTY: SHX125

## 2013-08-12 LAB — TYPE AND SCREEN
ABO/RH(D): B POS
Antibody Screen: NEGATIVE

## 2013-08-12 SURGERY — ARTHROPLASTY, KNEE, TOTAL
Anesthesia: Spinal | Site: Knee | Laterality: Right

## 2013-08-12 MED ORDER — DEXAMETHASONE SODIUM PHOSPHATE 10 MG/ML IJ SOLN
10.0000 mg | Freq: Every day | INTRAMUSCULAR | Status: AC
  Filled 2013-08-12: qty 1

## 2013-08-12 MED ORDER — PHENYLEPHRINE HCL 10 MG/ML IJ SOLN
INTRAMUSCULAR | Status: DC | PRN
  Administered 2013-08-12: 40 ug via INTRAVENOUS
  Administered 2013-08-12 (×3): 80 ug via INTRAVENOUS

## 2013-08-12 MED ORDER — METHOCARBAMOL 100 MG/ML IJ SOLN
500.0000 mg | Freq: Four times a day (QID) | INTRAVENOUS | Status: DC | PRN
  Administered 2013-08-13 – 2013-08-14 (×2): 500 mg via INTRAVENOUS
  Filled 2013-08-12 (×3): qty 5

## 2013-08-12 MED ORDER — CEFAZOLIN SODIUM 1-5 GM-% IV SOLN
1.0000 g | Freq: Four times a day (QID) | INTRAVENOUS | Status: AC
  Administered 2013-08-12 – 2013-08-13 (×2): 1 g via INTRAVENOUS
  Filled 2013-08-12 (×2): qty 50

## 2013-08-12 MED ORDER — MIDAZOLAM HCL 2 MG/2ML IJ SOLN
INTRAMUSCULAR | Status: AC
  Filled 2013-08-12: qty 2

## 2013-08-12 MED ORDER — LACTATED RINGERS IV SOLN
INTRAVENOUS | Status: DC
  Administered 2013-08-12: 1000 mL via INTRAVENOUS
  Administered 2013-08-12: 13:00:00 via INTRAVENOUS

## 2013-08-12 MED ORDER — SODIUM CHLORIDE 0.9 % IR SOLN
Status: DC | PRN
  Administered 2013-08-12: 1000 mL

## 2013-08-12 MED ORDER — BUPIVACAINE HCL 0.25 % IJ SOLN
INTRAMUSCULAR | Status: DC | PRN
  Administered 2013-08-12: 20 mL

## 2013-08-12 MED ORDER — METHOCARBAMOL 500 MG PO TABS
500.0000 mg | ORAL_TABLET | Freq: Four times a day (QID) | ORAL | Status: DC | PRN
  Administered 2013-08-13: 500 mg via ORAL
  Filled 2013-08-12 (×2): qty 1

## 2013-08-12 MED ORDER — CEFAZOLIN SODIUM-DEXTROSE 2-3 GM-% IV SOLR
INTRAVENOUS | Status: AC
  Filled 2013-08-12: qty 50

## 2013-08-12 MED ORDER — SODIUM CHLORIDE 0.9 % IJ SOLN
INTRAMUSCULAR | Status: DC | PRN
  Administered 2013-08-12: 13:00:00

## 2013-08-12 MED ORDER — FLEET ENEMA 7-19 GM/118ML RE ENEM: 1.0000 | ENEMA | Freq: Once | RECTAL | Status: AC | PRN

## 2013-08-12 MED ORDER — PROPOFOL INFUSION 10 MG/ML OPTIME
INTRAVENOUS | Status: DC | PRN
Start: 2013-08-12 — End: 2013-08-12
  Administered 2013-08-12: 100 ug/kg/min via INTRAVENOUS

## 2013-08-12 MED ORDER — BUPIVACAINE HCL (PF) 0.25 % IJ SOLN
INTRAMUSCULAR | Status: AC
  Filled 2013-08-12: qty 30

## 2013-08-12 MED ORDER — POLYETHYLENE GLYCOL 3350 17 G PO PACK: 17.0000 g | PACK | Freq: Every day | ORAL | Status: DC | PRN

## 2013-08-12 MED ORDER — ACETAMINOPHEN 325 MG PO TABS
650.0000 mg | ORAL_TABLET | Freq: Four times a day (QID) | ORAL | Status: DC | PRN
  Administered 2013-08-14: 650 mg via ORAL
  Filled 2013-08-12: qty 2

## 2013-08-12 MED ORDER — METOCLOPRAMIDE HCL 10 MG PO TABS: 5.0000 mg | ORAL_TABLET | Freq: Three times a day (TID) | ORAL | Status: DC | PRN

## 2013-08-12 MED ORDER — MIDAZOLAM HCL 5 MG/5ML IJ SOLN
INTRAMUSCULAR | Status: DC | PRN
  Administered 2013-08-12 (×3): 1 mg via INTRAVENOUS

## 2013-08-12 MED ORDER — PROPOFOL 10 MG/ML IV BOLUS
INTRAVENOUS | Status: AC
  Filled 2013-08-12: qty 20

## 2013-08-12 MED ORDER — OXYCODONE HCL 5 MG PO TABS
5.0000 mg | ORAL_TABLET | ORAL | Status: DC | PRN
  Administered 2013-08-12 – 2013-08-13 (×5): 5 mg via ORAL
  Filled 2013-08-12 (×6): qty 1

## 2013-08-12 MED ORDER — ACETAMINOPHEN 650 MG RE SUPP: 650.0000 mg | Freq: Four times a day (QID) | RECTAL | Status: DC | PRN

## 2013-08-12 MED ORDER — FENTANYL CITRATE 0.05 MG/ML IJ SOLN
INTRAMUSCULAR | Status: AC
  Filled 2013-08-12: qty 2

## 2013-08-12 MED ORDER — PHENOL 1.4 % MT LIQD: 1.0000 | OROMUCOSAL | Status: DC | PRN

## 2013-08-12 MED ORDER — BUPIVACAINE IN DEXTROSE 0.75-8.25 % IT SOLN
INTRATHECAL | Status: DC | PRN
  Administered 2013-08-12: 1.6 mL via INTRATHECAL

## 2013-08-12 MED ORDER — PROMETHAZINE HCL 25 MG/ML IJ SOLN: 6.2500 mg | INTRAMUSCULAR | Status: DC | PRN

## 2013-08-12 MED ORDER — EPHEDRINE SULFATE 50 MG/ML IJ SOLN
INTRAMUSCULAR | Status: DC | PRN
  Administered 2013-08-12: 5 mg via INTRAVENOUS
  Administered 2013-08-12: 10 mg via INTRAVENOUS
  Administered 2013-08-12: 5 mg via INTRAVENOUS

## 2013-08-12 MED ORDER — ONDANSETRON HCL 4 MG PO TABS
4.0000 mg | ORAL_TABLET | Freq: Four times a day (QID) | ORAL | Status: DC | PRN
  Administered 2013-08-14: 4 mg via ORAL
  Filled 2013-08-12: qty 1

## 2013-08-12 MED ORDER — 0.9 % SODIUM CHLORIDE (POUR BTL) OPTIME
TOPICAL | Status: DC | PRN
  Administered 2013-08-12: 1000 mL

## 2013-08-12 MED ORDER — METOCLOPRAMIDE HCL 5 MG/ML IJ SOLN
5.0000 mg | Freq: Three times a day (TID) | INTRAMUSCULAR | Status: DC | PRN
  Administered 2013-08-13 – 2013-08-14 (×4): 10 mg via INTRAVENOUS
  Filled 2013-08-12 (×4): qty 2

## 2013-08-12 MED ORDER — ZOLPIDEM TARTRATE 5 MG PO TABS
5.0000 mg | ORAL_TABLET | Freq: Every evening | ORAL | Status: DC | PRN
  Administered 2013-08-13 (×2): 5 mg via ORAL
  Filled 2013-08-12 (×2): qty 1

## 2013-08-12 MED ORDER — DIPHENHYDRAMINE HCL 12.5 MG/5ML PO ELIX: 12.5000 mg | ORAL_SOLUTION | ORAL | Status: DC | PRN

## 2013-08-12 MED ORDER — KETAMINE HCL 10 MG/ML IJ SOLN
INTRAMUSCULAR | Status: AC
  Filled 2013-08-12: qty 1

## 2013-08-12 MED ORDER — PROPOFOL 10 MG/ML IV BOLUS
INTRAVENOUS | Status: DC | PRN
  Administered 2013-08-12: 20 mg via INTRAVENOUS

## 2013-08-12 MED ORDER — ONDANSETRON HCL 4 MG/2ML IJ SOLN
INTRAMUSCULAR | Status: DC | PRN
  Administered 2013-08-12: 4 mg via INTRAVENOUS

## 2013-08-12 MED ORDER — SODIUM CHLORIDE 0.9 % IV SOLN: INTRAVENOUS | Status: DC

## 2013-08-12 MED ORDER — ACETAMINOPHEN 500 MG PO TABS
1000.0000 mg | ORAL_TABLET | Freq: Four times a day (QID) | ORAL | Status: AC
  Administered 2013-08-12 – 2013-08-13 (×3): 1000 mg via ORAL
  Filled 2013-08-12 (×4): qty 2

## 2013-08-12 MED ORDER — BUPIVACAINE LIPOSOME 1.3 % IJ SUSP
20.0000 mL | Freq: Once | INTRAMUSCULAR | Status: DC
Start: 2013-08-12 — End: 2013-08-12
  Filled 2013-08-12: qty 20

## 2013-08-12 MED ORDER — DEXAMETHASONE SODIUM PHOSPHATE 10 MG/ML IJ SOLN
10.0000 mg | Freq: Once | INTRAMUSCULAR | Status: AC
  Administered 2013-08-12: 10 mg via INTRAVENOUS

## 2013-08-12 MED ORDER — DOCUSATE SODIUM 100 MG PO CAPS
100.0000 mg | ORAL_CAPSULE | Freq: Two times a day (BID) | ORAL | Status: DC
  Administered 2013-08-12 – 2013-08-14 (×4): 100 mg via ORAL

## 2013-08-12 MED ORDER — ACETAMINOPHEN 10 MG/ML IV SOLN
1000.0000 mg | Freq: Once | INTRAVENOUS | Status: AC
  Administered 2013-08-12: 1000 mg via INTRAVENOUS
  Filled 2013-08-12: qty 100

## 2013-08-12 MED ORDER — CEFAZOLIN SODIUM-DEXTROSE 2-3 GM-% IV SOLR
2.0000 g | INTRAVENOUS | Status: AC
  Administered 2013-08-12: 2 g via INTRAVENOUS

## 2013-08-12 MED ORDER — KETAMINE HCL 10 MG/ML IJ SOLN
INTRAMUSCULAR | Status: DC | PRN
  Administered 2013-08-12 (×2): 20 mg via INTRAVENOUS

## 2013-08-12 MED ORDER — DEXTROSE-NACL 5-0.9 % IV SOLN
INTRAVENOUS | Status: DC
  Administered 2013-08-12 – 2013-08-14 (×4): via INTRAVENOUS

## 2013-08-12 MED ORDER — BISACODYL 10 MG RE SUPP: 10.0000 mg | Freq: Every day | RECTAL | Status: DC | PRN

## 2013-08-12 MED ORDER — TRANEXAMIC ACID 100 MG/ML IV SOLN
1000.0000 mg | INTRAVENOUS | Status: AC
  Administered 2013-08-12: 1000 mg via INTRAVENOUS
  Filled 2013-08-12: qty 10

## 2013-08-12 MED ORDER — PHENYLEPHRINE 40 MCG/ML (10ML) SYRINGE FOR IV PUSH (FOR BLOOD PRESSURE SUPPORT)
PREFILLED_SYRINGE | INTRAVENOUS | Status: AC
  Filled 2013-08-12: qty 10

## 2013-08-12 MED ORDER — MENTHOL 3 MG MT LOZG: 1.0000 | LOZENGE | OROMUCOSAL | Status: DC | PRN

## 2013-08-12 MED ORDER — FENTANYL CITRATE 0.05 MG/ML IJ SOLN
INTRAMUSCULAR | Status: DC | PRN
  Administered 2013-08-12: 100 ug via INTRAVENOUS

## 2013-08-12 MED ORDER — RIVAROXABAN 10 MG PO TABS
10.0000 mg | ORAL_TABLET | Freq: Every day | ORAL | Status: DC
  Administered 2013-08-13 – 2013-08-14 (×2): 10 mg via ORAL
  Filled 2013-08-12 (×3): qty 1

## 2013-08-12 MED ORDER — DEXAMETHASONE SODIUM PHOSPHATE 10 MG/ML IJ SOLN
INTRAMUSCULAR | Status: AC
  Filled 2013-08-12: qty 1

## 2013-08-12 MED ORDER — ONDANSETRON HCL 4 MG/2ML IJ SOLN
INTRAMUSCULAR | Status: AC
  Filled 2013-08-12: qty 2

## 2013-08-12 MED ORDER — HYDROMORPHONE HCL PF 1 MG/ML IJ SOLN: 0.2500 mg | INTRAMUSCULAR | Status: DC | PRN

## 2013-08-12 MED ORDER — ONDANSETRON HCL 4 MG/2ML IJ SOLN
4.0000 mg | Freq: Four times a day (QID) | INTRAMUSCULAR | Status: DC | PRN
  Administered 2013-08-12 – 2013-08-14 (×5): 4 mg via INTRAVENOUS
  Filled 2013-08-12 (×4): qty 2

## 2013-08-12 MED ORDER — DEXAMETHASONE 6 MG PO TABS
10.0000 mg | ORAL_TABLET | Freq: Every day | ORAL | Status: AC
  Administered 2013-08-13: 10 mg via ORAL
  Filled 2013-08-12: qty 1

## 2013-08-12 MED ORDER — MORPHINE SULFATE 2 MG/ML IJ SOLN
1.0000 mg | INTRAMUSCULAR | Status: DC | PRN
  Administered 2013-08-12 – 2013-08-14 (×3): 2 mg via INTRAVENOUS
  Filled 2013-08-12 (×3): qty 1

## 2013-08-12 MED ORDER — SODIUM CHLORIDE 0.9 % IJ SOLN
INTRAMUSCULAR | Status: AC
  Filled 2013-08-12: qty 50

## 2013-08-12 SURGICAL SUPPLY — 56 items
BAG SPEC THK2 15X12 ZIP CLS (MISCELLANEOUS)
BAG ZIPLOCK 12X15 (MISCELLANEOUS) ×1 IMPLANT
BANDAGE ELASTIC 6 VELCRO ST LF (GAUZE/BANDAGES/DRESSINGS) ×3 IMPLANT
BANDAGE ESMARK 6X9 LF (GAUZE/BANDAGES/DRESSINGS) ×1 IMPLANT
BLADE SAG 18X100X1.27 (BLADE) ×3 IMPLANT
BLADE SAW SGTL 11.0X1.19X90.0M (BLADE) ×3 IMPLANT
BNDG CMPR 9X6 STRL LF SNTH (GAUZE/BANDAGES/DRESSINGS) ×1
BNDG ESMARK 6X9 LF (GAUZE/BANDAGES/DRESSINGS) ×3
BOWL SMART MIX CTS (DISPOSABLE) ×3 IMPLANT
CAPT RP KNEE ×2 IMPLANT
CEMENT HV SMART SET (Cement) ×6 IMPLANT
CLOSURE WOUND 1/2 X4 (GAUZE/BANDAGES/DRESSINGS) ×2
CUFF TOURN SGL QUICK 34 (TOURNIQUET CUFF) ×3
CUFF TRNQT CYL 34X4X40X1 (TOURNIQUET CUFF) ×1 IMPLANT
DECANTER SPIKE VIAL GLASS SM (MISCELLANEOUS) ×3 IMPLANT
DRAPE EXTREMITY T 121X128X90 (DRAPE) ×3 IMPLANT
DRAPE POUCH INSTRU U-SHP 10X18 (DRAPES) ×3 IMPLANT
DRAPE U-SHAPE 47X51 STRL (DRAPES) ×3 IMPLANT
DRSG ADAPTIC 3X8 NADH LF (GAUZE/BANDAGES/DRESSINGS) ×3 IMPLANT
DURAPREP 26ML APPLICATOR (WOUND CARE) ×3 IMPLANT
ELECT REM PT RETURN 9FT ADLT (ELECTROSURGICAL) ×3
ELECTRODE REM PT RTRN 9FT ADLT (ELECTROSURGICAL) ×1 IMPLANT
EVACUATOR 1/8 PVC DRAIN (DRAIN) ×3 IMPLANT
FACESHIELD LNG OPTICON STERILE (SAFETY) ×15 IMPLANT
GLOVE BIO SURGEON STRL SZ7.5 (GLOVE) ×2 IMPLANT
GLOVE BIO SURGEON STRL SZ8 (GLOVE) ×3 IMPLANT
GLOVE BIOGEL PI IND STRL 8 (GLOVE) ×2 IMPLANT
GLOVE BIOGEL PI INDICATOR 8 (GLOVE) ×4
GLOVE SURG SS PI 6.5 STRL IVOR (GLOVE) ×2 IMPLANT
GOWN STRL REUS W/TWL LRG LVL3 (GOWN DISPOSABLE) ×5 IMPLANT
GOWN STRL REUS W/TWL XL LVL3 (GOWN DISPOSABLE) ×4 IMPLANT
HANDPIECE INTERPULSE COAX TIP (DISPOSABLE) ×3
IMMOBILIZER KNEE 20 (SOFTGOODS) ×3 IMPLANT
KIT BASIN OR (CUSTOM PROCEDURE TRAY) ×3 IMPLANT
MANIFOLD NEPTUNE II (INSTRUMENTS) ×3 IMPLANT
NDL SAFETY ECLIPSE 18X1.5 (NEEDLE) ×2 IMPLANT
NEEDLE HYPO 18GX1.5 SHARP (NEEDLE) ×6
NS IRRIG 1000ML POUR BTL (IV SOLUTION) ×3 IMPLANT
PACK TOTAL JOINT (CUSTOM PROCEDURE TRAY) ×3 IMPLANT
PAD ABD 8X10 STRL (GAUZE/BANDAGES/DRESSINGS) ×3 IMPLANT
PADDING CAST COTTON 6X4 STRL (CAST SUPPLIES) ×9 IMPLANT
POSITIONER SURGICAL ARM (MISCELLANEOUS) ×3 IMPLANT
SET HNDPC FAN SPRY TIP SCT (DISPOSABLE) ×1 IMPLANT
SPONGE GAUZE 4X4 12PLY (GAUZE/BANDAGES/DRESSINGS) ×3 IMPLANT
STRIP CLOSURE SKIN 1/2X4 (GAUZE/BANDAGES/DRESSINGS) ×4 IMPLANT
SUCTION FRAZIER 12FR DISP (SUCTIONS) ×3 IMPLANT
SUT MNCRL AB 4-0 PS2 18 (SUTURE) ×3 IMPLANT
SUT VIC AB 2-0 CT1 27 (SUTURE) ×9
SUT VIC AB 2-0 CT1 TAPERPNT 27 (SUTURE) ×3 IMPLANT
SUT VLOC 180 0 24IN GS25 (SUTURE) ×3 IMPLANT
SYR 20CC LL (SYRINGE) ×3 IMPLANT
SYR 50ML LL SCALE MARK (SYRINGE) ×3 IMPLANT
TOWEL OR 17X26 10 PK STRL BLUE (TOWEL DISPOSABLE) ×6 IMPLANT
TRAY FOLEY CATH 14FRSI W/METER (CATHETERS) ×3 IMPLANT
WATER STERILE IRR 1500ML POUR (IV SOLUTION) ×3 IMPLANT
WRAP KNEE MAXI GEL POST OP (GAUZE/BANDAGES/DRESSINGS) ×3 IMPLANT

## 2013-08-12 NOTE — Op Note (Signed)
Pre-operative diagnosis- Osteoarthritis  Right knee(s)  Post-operative diagnosis- Osteoarthritis Right knee(s)  Procedure-  Right  Total Knee Arthroplasty  Surgeon- Dione Plover. Kassondra Geil, MD  Assistant- Arlee Muslim, PA-C   Anesthesia-  Spinal EBL-* No blood loss amount entered *  Drains Hemovac  Tourniquet time-  Total Tourniquet Time Documented: Thigh (Right) - 31 minutes Total: Thigh (Right) - 31 minutes    Complications- None  Condition-PACU - hemodynamically stable.   Brief Clinical Note  Rhonda Gregory is a 73 y.o. year old female with end stage OA of her right knee with progressively worsening pain and dysfunction. She has constant pain, with activity and at rest and significant functional deficits with difficulties even with ADLs. She has had extensive non-op management including analgesics, injections of cortisone, and home exercise program, but remains in significant pain with significant dysfunction. Radiographs show bone on bone arthritis medial and patellofemoral. She presents now for right Total Knee Arthroplasty.    Procedure in detail---   The patient is brought into the operating room and positioned supine on the operating table. After successful administration of  Spinal,   a tourniquet is placed high on the  Right thigh(s) and the lower extremity is prepped and draped in the usual sterile fashion. Time out is performed by the operating team and then the  Right lower extremity is wrapped in Esmarch, knee flexed and the tourniquet inflated to 300 mmHg.       A midline incision is made with a ten blade through the subcutaneous tissue to the level of the extensor mechanism. A fresh blade is used to make a medial parapatellar arthrotomy. Soft tissue over the proximal medial tibia is subperiosteally elevated to the joint line with a knife and into the semimembranosus bursa with a Cobb elevator. Soft tissue over the proximal lateral tibia is elevated with attention being paid to  avoiding the patellar tendon on the tibial tubercle. The patella is everted, knee flexed 90 degrees and the ACL and PCL are removed. Findings are bone on bone medial and patellofemoral with large global osteophytes.        The drill is used to create a starting hole in the distal femur and the canal is thoroughly irrigated with sterile saline to remove the fatty contents. The 5 degree Right  valgus alignment guide is placed into the femoral canal and the distal femoral cutting block is pinned to remove 10 mm off the distal femur. Resection is made with an oscillating saw.      The tibia is subluxed forward and the menisci are removed. The extramedullary alignment guide is placed referencing proximally at the medial aspect of the tibial tubercle and distally along the second metatarsal axis and tibial crest. The block is pinned to remove 48mm off the more deficient medial  side. Resection is made with an oscillating saw. Size 2.5is the most appropriate size for the tibia and the proximal tibia is prepared with the modular drill and keel punch for that size.      The femoral sizing guide is placed and size 3 is most appropriate. Rotation is marked off the epicondylar axis and confirmed by creating a rectangular flexion gap at 90 degrees. The size 3 cutting block is pinned in this rotation and the anterior, posterior and chamfer cuts are made with the oscillating saw. The intercondylar block is then placed and that cut is made.      Trial size 2.5 tibial component, trial size 3 posterior stabilized  femur and a 10  mm posterior stabilized rotating platform insert trial is placed. Full extension is achieved with excellent varus/valgus and anterior/posterior balance throughout full range of motion. The patella is everted and thickness measured to be 22  mm. Free hand resection is taken to 12 mm, a 35 template is placed, lug holes are drilled, trial patella is placed, and it tracks normally. Osteophytes are removed off  the posterior femur with the trial in place. All trials are removed and the cut bone surfaces prepared with pulsatile lavage. Cement is mixed and once ready for implantation, the size 2.5 tibial implant, size  3 posterior stabilized femoral component, and the size 35 patella are cemented in place and the patella is held with the clamp. The trial insert is placed and the knee held in full extension. The Exparel (20 ml mixed with 30 ml saline) and .25% Bupivicaine, are injected into the extensor mechanism, posterior capsule, medial and lateral gutters and subcutaneous tissues.  All extruded cement is removed and once the cement is hard the permanent 10 mm posterior stabilized rotating platform insert is placed into the tibial tray.      The wound is copiously irrigated with saline solution and the extensor mechanism closed over a hemovac drain with #1 PDS suture. The tourniquet is released for a total tourniquet time of 31  minutes. Flexion against gravity is 140 degrees and the patella tracks normally. Subcutaneous tissue is closed with 2.0 vicryl and subcuticular with running 4.0 Monocryl. The incision is cleaned and dried and steri-strips and a bulky sterile dressing are applied. The limb is placed into a knee immobilizer and the patient is awakened and transported to recovery in stable condition.      Please note that a surgical assistant was a medical necessity for this procedure in order to perform it in a safe and expeditious manner. Surgical assistant was necessary to retract the ligaments and vital neurovascular structures to prevent injury to them and also necessary for proper positioning of the limb to allow for anatomic placement of the prosthesis.   Dione Plover Elda Dunkerson, MD    08/12/2013, 1:27 PM

## 2013-08-12 NOTE — Plan of Care (Signed)
Problem: Consults Goal: Diagnosis- Total Joint Replacement Primary Total Knee     

## 2013-08-12 NOTE — Interval H&P Note (Signed)
History and Physical Interval Note:  08/12/2013 11:32 AM  Rhonda Gregory  has presented today for surgery, with the diagnosis of OSTEOARTHRITIS RIGHT KNEE  The various methods of treatment have been discussed with the patient and family. After consideration of risks, benefits and other options for treatment, the patient has consented to  Procedure(s): RIGHT TOTAL KNEE ARTHROPLASTY (Right) as a surgical intervention .  The patient's history has been reviewed, patient examined, no change in status, stable for surgery.  I have reviewed the patient's chart and labs.  Questions were answered to the patient's satisfaction.     [name]

## 2013-08-12 NOTE — Progress Notes (Signed)
Placed pt on CPAP 4cmH2O per pt's home settings with no oxygen bled in. Pt is using her home nasal mask. Sterile water was added for humidification. Pt looks comfortable and is tolerating CPAP well at this time. RT will continue to monitor as needed.

## 2013-08-12 NOTE — Anesthesia Preprocedure Evaluation (Signed)
Anesthesia Evaluation  Patient identified by MRN, date of birth, ID band Patient awake    Reviewed: Allergy & Precautions, H&P , NPO status , Patient's Chart, lab work & pertinent test results  History of Anesthesia Complications (+) PONV  Airway Mallampati: II TM Distance: >3 FB Neck ROM: Full    Dental no notable dental hx.    Pulmonary neg pulmonary ROS, former smoker,  breath sounds clear to auscultation  Pulmonary exam normal       Cardiovascular hypertension, Pt. on medications Rhythm:Regular Rate:Normal     Neuro/Psych negative neurological ROS  negative psych ROS   GI/Hepatic negative GI ROS, Neg liver ROS,   Endo/Other  negative endocrine ROS  Renal/GU negative Renal ROS  negative genitourinary   Musculoskeletal negative musculoskeletal ROS (+)   Abdominal   Peds negative pediatric ROS (+)  Hematology negative hematology ROS (+)   Anesthesia Other Findings   Reproductive/Obstetrics negative OB ROS                           Anesthesia Physical Anesthesia Plan  ASA: II  Anesthesia Plan: Spinal   Post-op Pain Management:    Induction: Intravenous  Airway Management Planned: Simple Face Mask  Additional Equipment:   Intra-op Plan:   Post-operative Plan:   Informed Consent: I have reviewed the patients History and Physical, chart, labs and discussed the procedure including the risks, benefits and alternatives for the proposed anesthesia with the patient or authorized representative who has indicated his/her understanding and acceptance.   Dental advisory given  Plan Discussed with: CRNA and Surgeon  Anesthesia Plan Comments:         Anesthesia Quick Evaluation

## 2013-08-12 NOTE — Transfer of Care (Signed)
Immediate Anesthesia Transfer of Care Note  Patient: Rhonda Gregory  Procedure(s) Performed: Procedure(s): RIGHT TOTAL KNEE ARTHROPLASTY (Right)  Patient Location: PACU  Anesthesia Type:Spinal  Level of Consciousness: awake, alert , oriented and patient cooperative  Airway & Oxygen Therapy: Patient Spontanous Breathing and Patient connected to face mask oxygen  Post-op Assessment: Report given to PACU RN, Post -op Vital signs reviewed and stable and SAB level T12.  Post vital signs: Reviewed and stable  Complications: No apparent anesthesia complications

## 2013-08-12 NOTE — Progress Notes (Signed)
Utilization review completed.  

## 2013-08-12 NOTE — Anesthesia Postprocedure Evaluation (Signed)
  Anesthesia Post-op Note  Patient: Rhonda Gregory  Procedure(s) Performed: Procedure(s) (LRB): RIGHT TOTAL KNEE ARTHROPLASTY (Right)  Patient Location: PACU  Anesthesia Type: Spinal  Level of Consciousness: awake and alert   Airway and Oxygen Therapy: Patient Spontanous Breathing  Post-op Pain: mild  Post-op Assessment: Post-op Vital signs reviewed, Patient's Cardiovascular Status Stable, Respiratory Function Stable, Patent Airway and No signs of Nausea or vomiting  Last Vitals:  Filed Vitals:   08/12/13 1400  BP: 123/68  Pulse:   Temp: 36.5 C  Resp:     Post-op Vital Signs: stable   Complications: No apparent anesthesia complications

## 2013-08-12 NOTE — Anesthesia Procedure Notes (Signed)
Spinal  Patient location during procedure: OR Start time: 08/12/2013 12:24 PM End time: 08/12/2013 12:30 PM Staffing CRNA/Resident: Alfonso Patten J Performed by: resident/CRNA  Preanesthetic Checklist Completed: patient identified, surgical consent, pre-op evaluation, timeout performed, IV checked, risks and benefits discussed and monitors and equipment checked Spinal Block Patient position: sitting Prep: Betadine Patient monitoring: heart rate, continuous pulse ox and blood pressure Approach: midline Location: L3-4 Injection technique: single-shot Needle Needle type: Sprotte  Needle gauge: 24 G Needle length: 9 cm Additional Notes Lot #[id number]  Expires 08/2014      No c/o pain or paresthesia during procedure.   Patient tolerated well.

## 2013-08-13 DIAGNOSIS — D62 Acute posthemorrhagic anemia: Secondary | ICD-10-CM

## 2013-08-13 LAB — CBC
HEMATOCRIT: 32.7 % — AB (ref 36.0–46.0)
HEMOGLOBIN: 10.6 g/dL — AB (ref 12.0–15.0)
MCH: 29 pg (ref 26.0–34.0)
MCHC: 32.4 g/dL (ref 30.0–36.0)
MCV: 89.3 fL (ref 78.0–100.0)
Platelets: 227 10*3/uL (ref 150–400)
RBC: 3.66 MIL/uL — ABNORMAL LOW (ref 3.87–5.11)
RDW: 12.7 % (ref 11.5–15.5)
WBC: 14.7 10*3/uL — AB (ref 4.0–10.5)

## 2013-08-13 LAB — BASIC METABOLIC PANEL
BUN: 20 mg/dL (ref 6–23)
CHLORIDE: 99 meq/L (ref 96–112)
CO2: 25 mEq/L (ref 19–32)
Calcium: 9.3 mg/dL (ref 8.4–10.5)
Creatinine, Ser: 0.55 mg/dL (ref 0.50–1.10)
GFR calc Af Amer: >90 mL/min (ref >90)
GFR calc non Af Amer: >90 mL/min (ref >90)
GLUCOSE: 195 mg/dL — AB (ref 70–99)
POTASSIUM: 4 meq/L (ref 3.7–5.3)
Sodium: 137 mEq/L (ref 137–147)

## 2013-08-13 MED ORDER — METHOCARBAMOL 500 MG PO TABS: 500.0000 mg | ORAL_TABLET | Freq: Four times a day (QID) | ORAL | Status: DC | PRN

## 2013-08-13 MED ORDER — HYDROMORPHONE HCL 2 MG PO TABS
2.0000 mg | ORAL_TABLET | ORAL | Status: DC | PRN
  Administered 2013-08-13 – 2013-08-14 (×10): 2 mg via ORAL
  Filled 2013-08-13 (×10): qty 1

## 2013-08-13 MED ORDER — HYDROMORPHONE HCL 2 MG PO TABS: 2.0000 mg | ORAL_TABLET | ORAL | Status: DC | PRN

## 2013-08-13 MED ORDER — RIVAROXABAN 10 MG PO TABS: 10.0000 mg | ORAL_TABLET | Freq: Every day | ORAL | Status: DC

## 2013-08-13 NOTE — Evaluation (Signed)
Physical Therapy Evaluation Patient Details Name: Rhonda Gregory MRN: [id number] DOB: 25-Apr-1941 Today's Date: 08/13/2013 Time: 6433-2951 PT Time Calculation (min): 30 min  PT Assessment / Plan / Recommendation History of Present Illness     Clinical Impression  Pt s/p R TKR presents with decreased R LE strength/ROM, post op pain and nausea limiting functional mobility.  Pt should progress to d/c home with family assist and HHPT follow up    PT Assessment  Patient needs continued PT services    Follow Up Recommendations  Home health PT    Does the patient have the potential to tolerate intense rehabilitation      Barriers to Discharge        Equipment Recommendations  None recommended by PT    Recommendations for Other Services OT consult   Frequency 7X/week    Precautions / Restrictions Precautions Precautions: Knee;Fall Required Braces or Orthoses: Knee Immobilizer - Right Knee Immobilizer - Right: Discontinue once straight leg raise with < 10 degree lag Restrictions Weight Bearing Restrictions: No Other Position/Activity Restrictions: WBAT   Pertinent Vitals/Pain 5/10; premed, ice packs provided      Mobility  Bed Mobility Overal bed mobility: Needs Assistance Bed Mobility: Supine to Sit Supine to sit: Mod assist General bed mobility comments: cues for sequence and use of L LE to self assist Transfers Overall transfer level: Independent Equipment used: Rolling walker (2 wheeled) General transfer comment: cues for LE management and use of UEs to self assist Ambulation/Gait Ambulation/Gait assistance: Mod assist Ambulation Distance (Feet): 10 Feet Assistive device: Rolling walker (2 wheeled) Gait Pattern/deviations: Step-to pattern;Decreased step length - right;Decreased step length - left;Shuffle;Antalgic;Trunk flexed Gait velocity: decr General Gait Details: cues for posture, sequence and position from RW    Exercises Total Joint Exercises Ankle  Circles/Pumps: AROM;Both;10 reps;Supine Quad Sets: AROM;Both;10 reps;Supine Heel Slides: AAROM;Right;10 reps;Supine Straight Leg Raises: AAROM;Right;10 reps;Supine   PT Diagnosis: Difficulty walking  PT Problem List: Decreased strength;Decreased range of motion;Decreased activity tolerance;Decreased mobility;Decreased knowledge of use of DME;Pain PT Treatment Interventions: DME instruction;Gait training;Stair training;Functional mobility training;Therapeutic activities;Therapeutic exercise;Patient/family education     PT Goals(Current goals can be found in the care plan section) Acute Rehab PT Goals Patient Stated Goal: Resume previous lifestyle with decreased pain PT Goal Formulation: With patient Time For Goal Achievement: 08/17/13 Potential to Achieve Goals: Good  Visit Information  Last PT Received On: 08/13/13 Assistance Needed: +1       Prior [place] expects to be discharged to:: Private residence Living Arrangements: Spouse/significant other Available Help at Discharge: Family Type of Home: House Home Access: Stairs to enter Technical brewer of Steps: 1 Entrance Stairs-Rails: None Home Layout: Able to live on main level with bedroom/bathroom Home Equipment: Environmental consultant - 2 wheels Prior Function Level of Independence: Independent Communication Communication: No difficulties    Cognition  Cognition Arousal/Alertness: Awake/alert Behavior During Therapy: WFL for tasks assessed/performed Overall Cognitive Status: Within Functional Limits for tasks assessed    Extremity/Trunk Assessment Upper Extremity Assessment Upper Extremity Assessment: Overall WFL for tasks assessed Lower Extremity Assessment Lower Extremity Assessment: RLE deficits/detail RLE Deficits / Details: 2/5 quads with AAROM at knee -10 - 30 with muscle guarding   Balance    End of Session PT - End of Session Equipment Utilized During Treatment: Gait belt;Right knee  immobilizer Activity Tolerance: Patient tolerated treatment well;Other (comment) (nausea) Patient left: Other (comment);with call bell/phone within reach;with nursing/sitter in room [facility] with CNA) Nurse Communication: Mobility status  GP  Bronc Brosseau 08/13/2013, 12:07 PM

## 2013-08-13 NOTE — Evaluation (Signed)
Occupational Therapy Evaluation Patient Details Name: Rhonda Gregory MRN: [id number] DOB: 22-Aug-1940 Today's Date: 08/13/2013 Time: 9675-9163 OT Time Calculation (min): 20 min  OT Assessment / Plan / Recommendation History of present illness  s/p TKR    Clinical Impression   Pt presents to OT with decreased I with ADL activity s/p TKR. Pt will benefit from skilled OT to increase I with ADL activity and return to PLOF    OT Assessment  Patient needs continued OT Services                Frequency  Min 2X/week    Precautions / Restrictions Precautions Precautions: Knee;Fall Required Braces or Orthoses: Knee Immobilizer - Right Knee Immobilizer - Right: Discontinue once straight leg raise with < 10 degree lag Restrictions Weight Bearing Restrictions: No Other Position/Activity Restrictions: WBAT       ADL  Grooming: Set up Where Assessed - Grooming: Unsupported sitting Upper Body Bathing: Supervision/safety Where Assessed - Upper Body Bathing: Unsupported sitting Lower Body Bathing: Minimal assistance Where Assessed - Lower Body Bathing: Unsupported sit to stand Upper Body Dressing: Set up Where Assessed - Upper Body Dressing: Unsupported sitting Lower Body Dressing: Minimal assistance Where Assessed - Lower Body Dressing: Unsupported sitting Toilet Transfer: Minimal assistance Toilet Transfer Method: Sit to stand Transfers/Ambulation Related to ADLs: Educated in dressing and toilet transfer.  Pt feeling sick - so will practice next OT visit.    OT Diagnosis: Generalized weakness  OT Problem List: Decreased strength;Decreased activity tolerance OT Treatment Interventions: Self-care/ADL training;DME and/or AE instruction;Patient/family education   OT Goals(Current goals can be found in the care plan section) Acute Rehab OT Goals Patient Stated Goal: Resume previous lifestyle with decreased pain OT Goal Formulation: With patient Time For Goal Achievement:  08/27/13  Visit Information  Last OT Received On: 08/13/13       Prior [place] expects to be discharged to:: Private residence Living Arrangements: Spouse/significant other Available Help at Discharge: Family Type of Home: House Home Access: Stairs to enter Technical brewer of Steps: 1 Entrance Stairs-Rails: None Home Layout: Able to live on main level with bedroom/bathroom Home Equipment: Environmental consultant - 2 wheels Prior Function Level of Independence: Independent Communication Communication: No difficulties         Vision/Perception Vision - History Patient Visual Report: No change from baseline   Cognition  Cognition Arousal/Alertness: Awake/alert Behavior During Therapy: WFL for tasks assessed/performed Overall Cognitive Status: Within Functional Limits for tasks assessed    Extremity/Trunk Assessment Upper Extremity Assessment Upper Extremity Assessment: Overall WFL for tasks assessed     Mobility Bed Mobility Overal bed mobility: Needs Assistance Bed Mobility: Supine to Sit Supine to sit: Min assist Transfers Overall transfer level: Needs assistance Equipment used: Rolling walker (2 wheeled) Transfers: Sit to/from [facility] Sit to Stand: Min assist           End of Session OT - End of Session Activity Tolerance: Patient tolerated treatment well Patient left: in chair;with call bell/phone within reach Nurse Communication: Mobility status  GO     [name] 08/13/2013, 1:30 PM

## 2013-08-13 NOTE — Progress Notes (Signed)
   CARE MANAGEMENT NOTE 08/13/2013  Patient:  Fauble,Hasina T   Account Number:  [id number]  Date Initiated:  08/13/2013  Documentation initiated by:  [facility]  Subjective/Objective Assessment:   RIGHT TOTAL KNEE ARTHROPLASTY     Action/Plan:   [place]   Anticipated DC Date:  08/13/2013   Anticipated DC Plan:  [place]  CM consult      [facility] Choice  HOME HEALTH   Choice offered to / List presented to:  C-1 Patient   DME arranged  3-N-1      DME agency  [place] arranged  HH-2 PT      [place]   Status of service:  Completed, signed off Medicare Important Message given?   (If response is "NO", the following Medicare IM given date fields will be blank) Date Medicare IM given:   Date Additional Medicare IM given:    Discharge Disposition:  [place]  Per UR Regulation:    If discussed at Long Length of Stay Meetings, dates discussed:    Comments:  08/13/2013 1200 NCM spoke to pt and offered choice for [employer]. Pt agreeable to [facility] for [facility]. Requested 3n1 for home. She has cane and RW at home. Husband, Kimala Horne # [phone] is emergency contact. Contacted [facility] for DME for home. Jonnie Finner RN CCM Case Mgmt phone [phone]

## 2013-08-13 NOTE — Progress Notes (Signed)
Physical Therapy Treatment Patient Details Name: MARILLYN GOREN MRN: [id number] DOB: 04-15-1941 Today's Date: 08/13/2013 Time: 7408-1448 PT Time Calculation (min): 33 min  PT Assessment / Plan / Recommendation  History of Present Illness     PT Comments     Follow Up Recommendations  Home health PT     Does the patient have the potential to tolerate intense rehabilitation     Barriers to Discharge        Equipment Recommendations  None recommended by PT    Recommendations for Other Services OT consult  Frequency 7X/week   Progress towards PT Goals Progress towards PT goals: Progressing toward goals  Plan Current plan remains appropriate    Precautions / Restrictions Precautions Precautions: Knee;Fall Required Braces or Orthoses: Knee Immobilizer - Right Knee Immobilizer - Right: Discontinue once straight leg raise with < 10 degree lag Restrictions Weight Bearing Restrictions: No Other Position/Activity Restrictions: WBAT   Pertinent Vitals/Pain 4/10; premed, ice packs provided    Mobility  Bed Mobility Overal bed mobility: Needs Assistance Bed Mobility: Sit to Supine Supine to sit: Min assist General bed mobility comments: cues for sequence and use of L LE to self assist Transfers Overall transfer level: Needs assistance Equipment used: Rolling walker (2 wheeled) Transfers: Sit to/from Stand Sit to Stand: Min assist General transfer comment: cues for LE management and use of UEs to self assist Ambulation/Gait Ambulation/Gait assistance: Min assist;Mod assist Ambulation Distance (Feet): 45 Feet (and 5) Assistive device: Rolling walker (2 wheeled) Gait Pattern/deviations: Step-to pattern;Decreased step length - right;Decreased step length - left;Shuffle;Trunk flexed Gait velocity: decr General Gait Details: cues for posture, sequence and position from RW    Exercises     PT Diagnosis:    PT Problem List:   PT Treatment Interventions:     PT Goals (current  goals can now be found in the care plan section) Acute Rehab PT Goals Patient Stated Goal: Resume previous lifestyle with decreased pain PT Goal Formulation: With patient Time For Goal Achievement: 08/17/13 Potential to Achieve Goals: Good  Visit Information  Last PT Received On: 08/13/13 Assistance Needed: +1    Subjective Data  Patient Stated Goal: Resume previous lifestyle with decreased pain   Cognition  Cognition Arousal/Alertness: Awake/alert Behavior During Therapy: WFL for tasks assessed/performed Overall Cognitive Status: Within Functional Limits for tasks assessed    Balance     End of Session PT - End of Session Equipment Utilized During Treatment: Gait belt;Right knee immobilizer Activity Tolerance: Patient tolerated treatment well;Other (comment) Patient left: in bed;with call bell/phone within reach Nurse Communication: Mobility status   GP     Rachael Zapanta 08/13/2013, 2:23 PM

## 2013-08-13 NOTE — Progress Notes (Signed)
[facility]  [facility] is providing the following services: [employer]  If patient discharges after hours, please call [phone].   Linward Headland 08/13/2013, 12:51 PM

## 2013-08-13 NOTE — Discharge Instructions (Addendum)
° °Dr. Frank Aluisio °Total Joint Specialist °[facility] °[address] °[place], [place] [address] °[phone] ° °TOTAL KNEE REPLACEMENT POSTOPERATIVE DIRECTIONS ° ° ° °Knee Rehabilitation, Guidelines Following Surgery  °Results after knee surgery are often greatly improved when you follow the exercise, range of motion and muscle strengthening exercises prescribed by your doctor. Safety measures are also important to protect the knee from further injury. Any time any of these exercises cause you to have increased pain or swelling in your knee joint, decrease the amount until you are comfortable again and slowly increase them. If you have problems or questions, call your caregiver or physical therapist for advice.  ° °HOME CARE INSTRUCTIONS  °Remove items at home which could result in a fall. This includes throw rugs or furniture in walking pathways.  °Continue medications as instructed at time of discharge. °You may have some home medications which will be placed on hold until you complete the course of blood thinner medication.  °You may start showering once you are discharged home but do not submerge the incision under water. Just pat the incision dry and apply a dry gauze dressing on daily. °Walk with walker as instructed.  °You may resume a sexual relationship in one month or when given the OK by  your doctor.  °· Use walker as long as suggested by your caregivers. °· Avoid periods of inactivity such as sitting longer than an hour when not asleep. This helps prevent blood clots.  °You may put full weight on your legs and walk as much as is comfortable.  °You may return to work once you are cleared by your doctor.  °Do not drive a car for 6 weeks or until released by you surgeon.  °· Do not drive while taking narcotics.  °Wear the elastic stockings for three weeks following surgery during the day but you may remove then at night. °Make sure you keep all of your appointments after your  operation with all of your doctors and caregivers. You should call the office at the above phone number and make an appointment for approximately two weeks after the date of your surgery. °Change the dressing daily and reapply a dry dressing each time. °Please pick up a stool softener and laxative for home use as long as you are requiring pain medications. °· Continue to use ice on the knee for pain and swelling from surgery. You may notice swelling that will progress down to the foot and ankle.  This is normal after surgery.  Elevate the leg when you are not up walking on it.   °It is important for you to complete the blood thinner medication as prescribed by your doctor. °· Continue to use the breathing machine which will help keep your temperature down.  It is common for your temperature to cycle up and down following surgery, especially at night when you are not up moving around and exerting yourself.  The breathing machine keeps your lungs expanded and your temperature down. ° °RANGE OF MOTION AND STRENGTHENING EXERCISES  °Rehabilitation of the knee is important following a knee injury or an operation. After just a few days of immobilization, the muscles of the thigh which control the knee become weakened and shrink (atrophy). Knee exercises are designed to build up the tone and strength of the thigh muscles and to improve knee motion. Often times heat used for twenty to thirty minutes before working out will loosen up your tissues and help with improving the   range of motion but do not use heat for the first two weeks following surgery. These exercises can be done on a training (exercise) mat, on the floor, on a table or on a bed. Use what ever works the best and is most comfortable for you Knee exercises include:  °Leg Lifts - While your knee is still immobilized in a splint or cast, you can do straight leg raises. Lift the leg to 60 degrees, hold for 3 sec, and slowly lower the leg. Repeat 10-20 times 2-3  times daily. Perform this exercise against resistance later as your knee gets better.  °Quad and Hamstring Sets - Tighten up the muscle on the front of the thigh (Quad) and hold for 5-10 sec. Repeat this 10-20 times hourly. Hamstring sets are done by pushing the foot backward against an object and holding for 5-10 sec. Repeat as with quad sets.  °A rehabilitation program following serious knee injuries can speed recovery and prevent re-injury in the future due to weakened muscles. Contact your doctor or a physical therapist for more information on knee rehabilitation.  ° °SKILLED REHAB INSTRUCTIONS: °If the patient is transferred to a skilled rehab facility following release from the hospital, a list of the current medications will be sent to the facility for the patient to continue.  When discharged from the skilled rehab facility, please have the facility set up the patient's Home Health Physical Therapy prior to being released. Also, the skilled facility will be responsible for providing the patient with their medications at time of release from the facility to include their pain medication, the muscle relaxants, and their blood thinner medication. If the patient is still at the rehab facility at time of the two week follow up appointment, the skilled rehab facility will also need to assist the patient in arranging follow up appointment in our office and any transportation needs. ° °MAKE SURE YOU:  °Understand these instructions.  °Will watch your condition.  °Will get help right away if you are not doing well or get worse.  ° ° °Pick up stool softner and laxative for home. °Do not submerge incision under water. °May shower. °Continue to use ice for pain and swelling from surgery. ° °Take Xarelto for two and a half more weeks, then discontinue Xarelto. °Once the patient has completed the blood thinner regimen, then take a Baby 81 mg Aspirin daily for four more weeks. ° ° ° °Information on my medicine - XARELTO®  (Rivaroxaban) ° °This medication education was reviewed with me or my healthcare representative as part of my discharge preparation.  The pharmacist that spoke with me during my hospital stay was:  Absher, Randall K, RPH ° °Why was Xarelto® prescribed for you? °Xarelto® was prescribed for you to reduce the risk of blood clots forming after orthopedic surgery. The medical term for these abnormal blood clots is venous thromboembolism (VTE). ° °What do you need to know about xarelto® ? °Take your Xarelto® ONCE DAILY at the same time every day. °You may take it either with or without food. ° °If you have difficulty swallowing the tablet whole, you may crush it and mix in applesauce just prior to taking your dose. ° °Take Xarelto® exactly as prescribed by your doctor and DO NOT stop taking Xarelto® without talking to the doctor who prescribed the medication.  Stopping without other VTE prevention medication to take the place of Xarelto® may increase your risk of developing a clot. ° °After discharge, you should have regular   appointments with your healthcare provider that is prescribing your Xarelto.    What do you do if you miss a dose? If you miss a dose, take it as soon as you remember on the same day then continue your regularly scheduled once daily regimen the next day. Do not take two doses of Xarelto on the same day.   Important Safety Information A possible side effect of Xarelto is bleeding. You should call your healthcare provider right away if you experience any of the following:   Bleeding from an injury or your nose that does not stop.   Unusual colored urine (red or dark brown) or unusual colored stools (red or black).   Unusual bruising for unknown reasons.   A serious fall or if you hit your head (even if there is no bleeding).  Some medicines may interact with Xarelto and might increase your risk of bleeding while on Xarelto. To help avoid this, consult your healthcare provider  or pharmacist prior to using any new prescription or non-prescription medications, including herbals, vitamins, non-steroidal anti-inflammatory drugs (NSAIDs) and supplements.  This website has more information on Xarelto: [email or web address]

## 2013-08-13 NOTE — Discharge Summary (Signed)
Physician Discharge Summary   Patient ID: Rhonda Gregory MRN: [id number] DOB/AGE: 1941-05-18 73 y.o.  Admit date: 08/12/2013 Discharge date: 08/14/2013  Primary Diagnosis:  Osteoarthritis Right knee(s)  Admission Diagnoses:  Past Medical History  Diagnosis Date  . PONV (postoperative nausea and vomiting)   . Hypertension   . Sleep apnea   . Heartburn   . Arthritis   . 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   Weight as of this encounter: 64.864 kg (143 lb).  Procedure:  Procedure(s) (LRB): RIGHT TOTAL KNEE ARTHROPLASTY (Right)   Consults: None  HPI: Rhonda Gregory is a 73 y.o. year old female with end stage OA of her right knee with progressively worsening pain and dysfunction. She has constant pain, with activity and at rest and significant functional deficits with difficulties even with ADLs. She has had extensive non-op management including analgesics, injections of cortisone, and home exercise program, but remains in significant pain with significant dysfunction. Radiographs show bone on bone arthritis medial and patellofemoral. She presents now for right Total Knee Arthroplasty.   Laboratory Data: Admission on 08/12/2013, Discharged on 08/14/2013  Component Date Value Ref Range Status  . WBC 08/13/2013 14.7* 4.0 - 10.5 K/uL Final  . RBC 08/13/2013 3.66* 3.87 - 5.11 MIL/uL Final  . Hemoglobin 08/13/2013 10.6* 12.0 - 15.0 g/dL Final  . HCT 08/13/2013 32.7* 36.0 - 46.0 % Final  . MCV 08/13/2013 89.3  78.0 - 100.0 fL Final  . MCH 08/13/2013 29.0  26.0 - 34.0 pg Final  . MCHC 08/13/2013 32.4  30.0 - 36.0 g/dL Final  . RDW 08/13/2013 12.7  11.5 - 15.5 % Final  . Platelets 08/13/2013 227  150 - 400 K/uL Final  . Sodium  08/13/2013 137  137 - 147 mEq/L Final  . Potassium 08/13/2013 4.0  3.7 - 5.3 mEq/L Final  . Chloride 08/13/2013 99  96 - 112 mEq/L Final  . CO2 08/13/2013 25  19 - 32 mEq/L Final  . Glucose, Bld 08/13/2013 195* 70 - 99 mg/dL Final  . BUN 08/13/2013 20  6 - 23 mg/dL Final  . Creatinine, Ser 08/13/2013 0.55  0.50 - 1.10 mg/dL Final  . Calcium 08/13/2013 9.3  8.4 - 10.5 mg/dL Final  . GFR calc non Af Amer 08/13/2013 >90  >90 mL/min Final  . GFR calc Af Amer 08/13/2013 >90  >90 mL/min Final   Comment: (NOTE)                          The eGFR has been calculated using the CKD EPI equation.                          This calculation has not been validated in all clinical situations.                          eGFR's persistently <90 mL/min signify possible Chronic Kidney                          Disease.  . WBC 08/14/2013 19.5* 4.0 - 10.5 K/uL  Final  . RBC 08/14/2013 3.41* 3.87 - 5.11 MIL/uL Final  . Hemoglobin 08/14/2013 9.9* 12.0 - 15.0 g/dL Final  . HCT 08/14/2013 30.5* 36.0 - 46.0 % Final  . MCV 08/14/2013 89.4  78.0 - 100.0 fL Final  . MCH 08/14/2013 29.0  26.0 - 34.0 pg Final  . MCHC 08/14/2013 32.5  30.0 - 36.0 g/dL Final  . RDW 08/14/2013 13.2  11.5 - 15.5 % Final  . Platelets 08/14/2013 243  150 - 400 K/uL Final  . Sodium 08/14/2013 137  137 - 147 mEq/L Final  . Potassium 08/14/2013 4.0  3.7 - 5.3 mEq/L Final  . Chloride 08/14/2013 100  96 - 112 mEq/L Final  . CO2 08/14/2013 25  19 - 32 mEq/L Final  . Glucose, Bld 08/14/2013 156* 70 - 99 mg/dL Final  . BUN 08/14/2013 15  6 - 23 mg/dL Final  . Creatinine, Ser 08/14/2013 0.53  0.50 - 1.10 mg/dL Final  . Calcium 08/14/2013 9.3  8.4 - 10.5 mg/dL Final  . GFR calc non Af Amer 08/14/2013 >90  >90 mL/min Final  . GFR calc Af Amer 08/14/2013 >90  >90 mL/min Final   Comment: (NOTE)                          The eGFR has been calculated using the CKD EPI equation.                          This calculation has not been validated in all  clinical situations.                          eGFR's persistently <90 mL/min signify possible Chronic Kidney                          Disease.  Hospital Outpatient Visit on 08/08/2013  Component Date Value Ref Range Status  . aPTT 08/08/2013 27  24 - 37 seconds Final  . WBC 08/08/2013 11.1* 4.0 - 10.5 K/uL Final  . RBC 08/08/2013 4.58  3.87 - 5.11 MIL/uL Final  . Hemoglobin 08/08/2013 13.5  12.0 - 15.0 g/dL Final  . HCT 08/08/2013 41.6  36.0 - 46.0 % Final  . MCV 08/08/2013 90.8  78.0 - 100.0 fL Final  . MCH 08/08/2013 29.5  26.0 - 34.0 pg Final  . MCHC 08/08/2013 32.5  30.0 - 36.0 g/dL Final  . RDW 08/08/2013 13.1  11.5 - 15.5 % Final  . Platelets 08/08/2013 268  150 - 400 K/uL Final  . Sodium 08/08/2013 140  137 - 147 mEq/L Final  . Potassium 08/08/2013 4.1  3.7 - 5.3 mEq/L Final  . Chloride 08/08/2013 99  96 - 112 mEq/L Final  . CO2 08/08/2013 28  19 - 32 mEq/L Final  . Glucose, Bld 08/08/2013 89  70 - 99 mg/dL Final  . BUN 08/08/2013 23  6 - 23 mg/dL Final  . Creatinine, Ser 08/08/2013 0.67  0.50 - 1.10 mg/dL Final  . Calcium 08/08/2013 10.7* 8.4 - 10.5 mg/dL Final  . Total Protein 08/08/2013 8.0  6.0 - 8.3 g/dL Final  . Albumin 08/08/2013 4.3  3.5 - 5.2 g/dL Final  . AST 08/08/2013 24  0 - 37 U/L Final  . ALT 08/08/2013 29  0 - 35 U/L Final  . Alkaline Phosphatase 08/08/2013 92  39 - 117  U/L Final  . Total Bilirubin 08/08/2013 0.2* 0.3 - 1.2 mg/dL Final  . GFR calc non Af Amer 08/08/2013 86* >90 mL/min Final  . GFR calc Af Amer 08/08/2013 >90  >90 mL/min Final   Comment: (NOTE)                          The eGFR has been calculated using the CKD EPI equation.                          This calculation has not been validated in all clinical situations.                          eGFR's persistently <90 mL/min signify possible Chronic Kidney                          Disease.  Marland Kitchen Prothrombin Time 08/08/2013 12.1  11.6 - 15.2 seconds Final  . INR 08/08/2013 0.91  0.00 - 1.49 Final    . ABO/RH(D) 08/08/2013 B POS   Final  . Antibody Screen 08/08/2013 NEG   Final  . Sample Expiration 08/08/2013 08/15/2013   Final  . Color, Urine 08/08/2013 YELLOW  YELLOW Final  . APPearance 08/08/2013 CLEAR  CLEAR Final  . Specific Gravity, Urine 08/08/2013 1.024  1.005 - 1.030 Final  . pH 08/08/2013 5.5  5.0 - 8.0 Final  . Glucose, UA 08/08/2013 NEGATIVE  NEGATIVE mg/dL Final  . Hgb urine dipstick 08/08/2013 NEGATIVE  NEGATIVE Final  . Bilirubin Urine 08/08/2013 NEGATIVE  NEGATIVE Final  . Ketones, ur 08/08/2013 NEGATIVE  NEGATIVE mg/dL Final  . Protein, ur 08/08/2013 NEGATIVE  NEGATIVE mg/dL Final  . Urobilinogen, UA 08/08/2013 0.2  0.0 - 1.0 mg/dL Final  . Nitrite 08/08/2013 NEGATIVE  NEGATIVE Final  . Leukocytes, UA 08/08/2013 NEGATIVE  NEGATIVE Final   MICROSCOPIC NOT DONE ON URINES WITH NEGATIVE PROTEIN, BLOOD, LEUKOCYTES, NITRITE, OR GLUCOSE <1000 mg/dL.  Marland Kitchen MRSA, PCR 08/08/2013 NEGATIVE  NEGATIVE Final  . Staphylococcus aureus 08/08/2013 NEGATIVE  NEGATIVE Final   Comment:                                 The Xpert SA Assay (FDA                          approved for NASAL specimens                          in patients over 35 years of age),                          is one component of                          a comprehensive surveillance                          program.  Test performance has                          been validated by [employer]  [facility] for patients greater                          than or equal to 32 year old.                          It is not intended                          to diagnose infection nor to                          guide or monitor treatment.  . ABO/RH(D) 08/08/2013 B POS   Final     X-Rays:Dg Chest 2 View  08/08/2013   CLINICAL DATA:  Persistent pain status post fall over 1 week ago. Pain in the left shoulder and upper chest  EXAM: CHEST  2 VIEW  COMPARISON:  Concurrently obtained radiographs of the left shoulder; prior  chest x-ray 12/01/2010  FINDINGS: The lungs are clear and negative for focal airspace consolidation, pulmonary edema or suspicious pulmonary nodule. No pleural effusion or pneumothorax. Cardiac and mediastinal contours are within normal limits. No acute fracture or lytic or blastic osseous lesions. The visualized upper abdominal bowel gas pattern is unremarkable.  IMPRESSION: No active cardiopulmonary disease.   Electronically Signed   By: Jacqulynn Cadet M.D.   On: 08/08/2013 10:35   Dg Shoulder Left  08/08/2013   CLINICAL DATA:  Persistent left shoulder pain for greater than week following fall  EXAM: LEFT SHOULDER - 2+ VIEW  COMPARISON:  Concurrently obtained chest x-ray  FINDINGS: There is no evidence of fracture or dislocation. There is no evidence of arthropathy or other focal bone abnormality. Soft tissues are unremarkable.  IMPRESSION: Negative.   Electronically Signed   By: Jacqulynn Cadet M.D.   On: 08/08/2013 10:36    EKG:No orders found for this or any previous visit.   Hospital Course: Rhonda Gregory is a 73 y.o. who was admitted to [facility]. They were brought to the operating room on 08/12/2013 and underwent Procedure(s): RIGHT TOTAL KNEE ARTHROPLASTY.  Patient tolerated the procedure well and was later transferred to the recovery room and then to the orthopaedic floor for postoperative care.  They were given PO and IV analgesics for pain control following their surgery.  They were given 24 hours of postoperative antibiotics of  Anti-infectives   Start     Dose/Rate Route Frequency Ordered Stop   08/12/13 1800  ceFAZolin (ANCEF) IVPB 1 g/50 mL premix     1 g 100 mL/hr over 30 Minutes Intravenous Every 6 hours 08/12/13 1529 08/13/13 0031   08/12/13 0929  ceFAZolin (ANCEF) IVPB 2 g/50 mL premix     2 g 100 mL/hr over 30 Minutes Intravenous On call to O.R. 08/12/13 7342 08/12/13 1226     and started on DVT prophylaxis in the form of Xarelto.   PT and OT were ordered for  total joint protocol.  Discharge planning consulted to help with postop disposition and equipment needs.  Patient had a tough night on the evening of surgery.  They started to get up OOB with therapy on day one. Hemovac drain was pulled without difficulty.  Continued to work with therapy into day two.  Dressing was changed on day two and the incision was healing well.  Patient was seen  in rounds and was ready to go home later that same day.   Discharge Medications: Prior to Admission medications   Medication Sig Start Date End Date Taking? Authorizing Provider  lisinopril-hydrochlorothiazide (PRINZIDE,ZESTORETIC) 20-12.5 MG per tablet Take 1 tablet by mouth 2 (two) times daily.   Yes Historical Provider, MD  simvastatin (ZOCOR) 20 MG tablet Take 20 mg by mouth every evening.   Yes Historical Provider, MD  zolpidem (AMBIEN) 10 MG tablet Take 5 mg by mouth at bedtime as needed for sleep.   Yes Historical Provider, MD  HYDROmorphone (DILAUDID) 2 MG tablet Take 1-2 tablets (2-4 mg total) by mouth every 3 (three) hours as needed for moderate pain or severe pain. 08/13/13   Alexzandrew Dara Lords, PA-C  methocarbamol (ROBAXIN) 500 MG tablet Take 1 tablet (500 mg total) by mouth every 6 (six) hours as needed for muscle spasms. 08/13/13   Alexzandrew Dara Lords, PA-C  rivaroxaban (XARELTO) 10 MG TABS tablet Take 1 tablet (10 mg total) by mouth daily with breakfast. Take Xarelto for two and a half more weeks, then discontinue Xarelto. Once the patient has completed the blood thinner regimen, then take a Baby 81 mg Aspirin daily for four more weeks. 08/13/13   Alexzandrew Dara Lords, PA-C   Discharge home with home health  Diet - Cardiac diet  Follow up - in 2 weeks  Activity - WBAT  Disposition - Home  Condition Upon Discharge - Good  D/C Meds - See DC Summary  DVT Prophylaxis - Xarelto       Discharge Orders   Future Orders Complete By Expires   Call MD / Call 911  As directed    Comments:     If you  experience chest pain or shortness of breath, CALL 911 and be transported to the hospital emergency room.  If you develope a fever above 101 F, pus (white drainage) or increased drainage or redness at the wound, or calf pain, call your surgeon's office.   Change dressing  As directed    Comments:     Change dressing daily with sterile 4 x 4 inch gauze dressing and apply TED hose. Do not submerge the incision under water.   Constipation Prevention  As directed    Comments:     Drink plenty of fluids.  Prune juice may be helpful.  You may use a stool softener, such as Colace (over the counter) 100 mg twice a day.  Use MiraLax (over the counter) for constipation as needed.   Diet general  As directed    Discharge instructions  As directed    Comments:     Pick up stool softner and laxative for home. Do not submerge incision under water. May shower. Continue to use ice for pain and swelling from surgery.  Take Xarelto for two and a half more weeks, then discontinue Xarelto. Once the patient has completed the blood thinner regimen, then take a Baby 81 mg Aspirin daily for four more weeks.   Do not put a pillow under the knee. Place it under the heel.  As directed    Do not sit on low chairs, stoools or toilet seats, as it may be difficult to get up from low surfaces  As directed    Driving restrictions  As directed    Comments:     No driving until released by the physician.   Increase activity slowly as tolerated  As directed    Lifting restrictions  As directed  Comments:     No lifting until released by the physician.   Patient may shower  As directed    Comments:     You may shower without a dressing once there is no drainage.  Do not wash over the wound.  If drainage remains, do not shower until drainage stops.   TED hose  As directed    Comments:     Use stockings (TED hose) for 3 weeks on both leg(s).  You may remove them at night for sleeping.   Weight bearing as tolerated  As  directed    Questions:     Laterality:     Extremity:         Medication List    STOP taking these medications       Fish Oil Oil     meloxicam 7.5 MG tablet  Commonly known as:  MOBIC      TAKE these medications       HYDROmorphone 2 MG tablet  Commonly known as:  DILAUDID  Take 1-2 tablets (2-4 mg total) by mouth every 3 (three) hours as needed for moderate pain or severe pain.     lisinopril-hydrochlorothiazide 20-12.5 MG per tablet  Commonly known as:  PRINZIDE,ZESTORETIC  Take 1 tablet by mouth 2 (two) times daily.     methocarbamol 500 MG tablet  Commonly known as:  ROBAXIN  Take 1 tablet (500 mg total) by mouth every 6 (six) hours as needed for muscle spasms.     ondansetron 4 MG tablet  Commonly known as:  ZOFRAN  Take 1 tablet (4 mg total) by mouth every 6 (six) hours as needed for nausea.     rivaroxaban 10 MG Tabs tablet  Commonly known as:  XARELTO  - Take 1 tablet (10 mg total) by mouth daily with breakfast. Take Xarelto for two and a half more weeks, then discontinue Xarelto.  - Once the patient has completed the blood thinner regimen, then take a Baby 81 mg Aspirin daily for four more weeks.     simvastatin 20 MG tablet  Commonly known as:  ZOCOR  Take 20 mg by mouth every evening.     zolpidem 10 MG tablet  Commonly known as:  AMBIEN  Take 5 mg by mouth at bedtime as needed for sleep.       Follow-up Information   Follow up with [facility])    Contact information:   [place] [address] [phone]       Follow up with [name], MD. Schedule an appointment as soon as possible for a visit on 08/27/2013. (For check up. Call [phone] tomorrow to make the appointment)    Specialty:  Orthopedic Surgery   Contact information:   [address] [place] [address] [phone]       Signed: Mickel Crow 08/26/2013, 10:42 AM

## 2013-08-13 NOTE — Progress Notes (Signed)
   Subjective: 1 Day Post-Op Procedure(s) (LRB): RIGHT TOTAL KNEE ARTHROPLASTY (Right) Patient reports pain as moderate.   Patient seen in rounds with Dr. Wynelle Link.  "I'm here".  Tough night with nausea.  Switched Oxycodone to Dilaudid tablets. Patient is well, and has had no acute complaints or problems We will start therapy today.  Plan is to go Home after hospital stay.  Objective: Vital signs in last 24 hours: Temp:  [97.5 F (36.4 C)-97.9 F (36.6 C)] 97.7 F (36.5 C) (02/24 0640) Pulse Rate:  [71-96] 72 (02/24 0640) Resp:  [12-18] 16 (02/24 0640) BP: (120-146)/(68-84) 123/74 mmHg (02/24 0640) SpO2:  [95 %-100 %] 96 % (02/24 0640) Weight:  [64.864 kg (143 lb)] 64.864 kg (143 lb) (02/23 1835)  Intake/Output from previous day:  Intake/Output Summary (Last 24 hours) at 08/13/13 0830 Last data filed at 08/13/13 0820  Gross per 24 hour  Intake 4143.75 ml  Output   1530 ml  Net 2613.75 ml    Intake/Output this shift: Total I/O In: 200 [P.O.:200] Out: -   Labs:  Recent Labs  08/13/13 0415  HGB 10.6*    Recent Labs  08/13/13 0415  WBC 14.7*  RBC 3.66*  HCT 32.7*  PLT 227    Recent Labs  08/13/13 0415  NA 137  K 4.0  CL 99  CO2 25  BUN 20  CREATININE 0.55  GLUCOSE 195*  CALCIUM 9.3   No results found for this basename: LABPT, INR,  in the last 72 hours  EXAM General - Patient is Alert and Appropriate Extremity - Neurovascular intact Sensation intact distally Dressing - dressing C/D/I Motor Function - intact, moving foot and toes well on exam.  Hemovac pulled without difficulty.  Past Medical History  Diagnosis Date  . PONV (postoperative nausea and vomiting)   . Hypertension   . Sleep apnea   . Heartburn   . Arthritis   . Cancer 1995    uterine    Assessment/Plan: 1 Day Post-Op Procedure(s) (LRB): RIGHT TOTAL KNEE ARTHROPLASTY (Right) Principal Problem:   OA (osteoarthritis) of knee Active Problems:   Postoperative anemia due to  acute blood loss  Estimated body mass index is 26.59 kg/(m^2) as calculated from the following:   Height as of this encounter: 5' 1.5" (1.562 m).   Weight as of this encounter: 64.864 kg (143 lb). Advance diet Up with therapy Plan for discharge tomorrow Discharge home with home health  DVT Prophylaxis - Xarelto Weight-Bearing as tolerated to right leg D/C O2 and Pulse OX and try on Room Air  Rhonda Gregory 08/13/2013, 8:30 AM

## 2013-08-14 LAB — BASIC METABOLIC PANEL
BUN: 15 mg/dL (ref 6–23)
CALCIUM: 9.3 mg/dL (ref 8.4–10.5)
CO2: 25 mEq/L (ref 19–32)
Chloride: 100 mEq/L (ref 96–112)
Creatinine, Ser: 0.53 mg/dL (ref 0.50–1.10)
GFR calc Af Amer: >90 mL/min (ref >90)
GFR calc non Af Amer: >90 mL/min (ref >90)
GLUCOSE: 156 mg/dL — AB (ref 70–99)
Potassium: 4 mEq/L (ref 3.7–5.3)
Sodium: 137 mEq/L (ref 137–147)

## 2013-08-14 LAB — CBC
HEMATOCRIT: 30.5 % — AB (ref 36.0–46.0)
HEMOGLOBIN: 9.9 g/dL — AB (ref 12.0–15.0)
MCH: 29 pg (ref 26.0–34.0)
MCHC: 32.5 g/dL (ref 30.0–36.0)
MCV: 89.4 fL (ref 78.0–100.0)
Platelets: 243 10*3/uL (ref 150–400)
RBC: 3.41 MIL/uL — ABNORMAL LOW (ref 3.87–5.11)
RDW: 13.2 % (ref 11.5–15.5)
WBC: 19.5 10*3/uL — ABNORMAL HIGH (ref 4.0–10.5)

## 2013-08-14 MED ORDER — ONDANSETRON HCL 4 MG PO TABS: 4.0000 mg | ORAL_TABLET | Freq: Four times a day (QID) | ORAL | Status: DC | PRN

## 2013-08-14 NOTE — Progress Notes (Signed)
Occupational Therapy Treatment Patient Details Name: ALTOVISE WAHLER MRN: [id number] DOB: 1941-01-20 Today's Date: 08/14/2013 Time: 7342-8768 OT Time Calculation (min): 17 min  OT Assessment / Plan / Recommendation  History of present illness R TKA   OT comments  Will need to practice shower transfer on next visit  Follow Up Recommendations  No OT follow up    Barriers to Discharge       Equipment Recommendations  3 in 1 bedside comode    Recommendations for Other Services    Frequency Min 2X/week   Progress towards OT Goals Progress towards OT goals: Progressing toward goals  Plan      Precautions / Restrictions Precautions Precautions: Knee;Fall Required Braces or Orthoses: Knee Immobilizer - Right Knee Immobilizer - Right: Discontinue once straight leg raise with < 10 degree lag Restrictions Other Position/Activity Restrictions: WBAT   Pertinent Vitals/Pain R knee sore; c/o nausea feeling:  Premedicated for this    ADL  Toilet Transfer: Min Psychiatric nurse Method: Arts development officer: Therapist, occupational and Hygiene: Set up Where Assessed - Best boy and Hygiene: Sit on 3-in-1 or toilet Transfers/Ambulation Related to ADLs: spt to 3:1 ADL Comments: pt still limited by feeling nauseas. Husband will assist with LB adls.  Not interested in AE.     OT Diagnosis:    OT Problem List:   OT Treatment Interventions:     OT Goals(current goals can now be found in the care plan section)    Visit Information  Last OT Received On: 08/14/13 Assistance Needed: +1 History of Present Illness: R TKA    Subjective Data      Prior [place] expects to be discharged to:: Private residence    Cognition  Cognition Arousal/Alertness: Awake/alert Behavior During Therapy: [facility] for tasks assessed/performed Overall Cognitive Status: Within Functional Limits for tasks  assessed    Mobility  Bed Mobility Supine to sit: Min assist General bed mobility comments: from flat bed.  Assist for RLE Transfers Sit to Stand: Min guard General transfer comment: cues for hand placement    Exercises      Balance    End of Session OT - End of Session Activity Tolerance:  (limited by nausea) Patient left: in bed;with call bell/phone within reach  [place] 08/14/2013, 8:50 AM Lesle Chris, OTR/L [phone] 08/14/2013

## 2013-08-14 NOTE — Progress Notes (Signed)
Physical Therapy Treatment Patient Details Name: Rhonda Gregory MRN: [id number] DOB: February 09, 1941 Today's Date: 08/14/2013 Time: 0630-1601 PT Time Calculation (min): 38 min  PT Assessment / Plan / Recommendation  History of Present Illness R TKA   PT Comments   Reviewed don/doff KI and stairs with pt.  Follow Up Recommendations  Home health PT     Does the patient have the potential to tolerate intense rehabilitation     Barriers to Discharge        Equipment Recommendations  None recommended by PT    Recommendations for Other Services OT consult  Frequency 7X/week   Progress towards PT Goals Progress towards PT goals: Progressing toward goals  Plan Current plan remains appropriate    Precautions / Restrictions Precautions Precautions: Knee;Fall Required Braces or Orthoses: Knee Immobilizer - Right Knee Immobilizer - Right: Discontinue once straight leg raise with < 10 degree lag Restrictions Weight Bearing Restrictions: No Other Position/Activity Restrictions: WBAT   Pertinent Vitals/Pain 5/10; premed, cold packs provided    Mobility  Bed Mobility Overal bed mobility: Needs Assistance Bed Mobility: Supine to Sit Supine to sit: Min assist General bed mobility comments: from flat bed.  Assist for RLE Transfers Overall transfer level: Needs assistance Equipment used: Rolling walker (2 wheeled) Transfers: Sit to/from Stand Sit to Stand: Min guard General transfer comment: cues for hand placement Ambulation/Gait Ambulation/Gait assistance: Min guard Ambulation Distance (Feet): 76 Feet Assistive device: Rolling walker (2 wheeled) Gait Pattern/deviations: Step-to pattern;Decreased step length - right;Decreased step length - left;Shuffle;Trunk flexed Gait velocity: decr General Gait Details: cues for posture, sequence and position from RW Stairs: Yes Stairs assistance: Min assist Stair Management: No rails;Step to pattern;Backwards;With walker Number of Stairs: 1  (twice) General stair comments: cues for sequence and foot/RW placement    Exercises Total Joint Exercises Ankle Circles/Pumps: AROM;Both;Supine;20 reps Quad Sets: AROM;Both;Supine;20 reps Heel Slides: AAROM;Right;Supine;20 reps Straight Leg Raises: AAROM;Right;Supine;20 reps Goniometric ROM: AAROM at R knee -10 - 45   PT Diagnosis:    PT Problem List:   PT Treatment Interventions:     PT Goals (current goals can now be found in the care plan section) Acute Rehab PT Goals Patient Stated Goal: Resume previous lifestyle with decreased pain PT Goal Formulation: With patient Time For Goal Achievement: 08/17/13 Potential to Achieve Goals: Good  Visit Information  Last PT Received On: 08/14/13 Assistance Needed: +1 History of Present Illness: R TKA    Subjective Data  Subjective: This is better than yesterday Patient Stated Goal: Resume previous lifestyle with decreased pain   Cognition  Cognition Arousal/Alertness: Awake/alert Behavior During Therapy: WFL for tasks assessed/performed Overall Cognitive Status: Within Functional Limits for tasks assessed    Balance     End of Session PT - End of Session Equipment Utilized During Treatment: Gait belt;Right knee immobilizer Activity Tolerance: Patient tolerated treatment well Patient left: in chair;with call bell/phone within reach Nurse Communication: Mobility status   GP     Daion Ginsberg 08/14/2013, 1:30 PM

## 2013-08-14 NOTE — Progress Notes (Signed)
Physical Therapy Treatment Patient Details Name: Rhonda Gregory MRN: [id number] DOB: 10/09/1940 Today's Date: 08/14/2013 Time: 6468-0321 PT Time Calculation (min): 26 min  PT Assessment / Plan / Recommendation  History of Present Illness R TKA   PT Comments   Reviewed stairs, don/doff KI and home therex with pt and spouse.  Follow Up Recommendations  Home health PT     Does the patient have the potential to tolerate intense rehabilitation     Barriers to Discharge        Equipment Recommendations  None recommended by PT    Recommendations for Other Services OT consult  Frequency 7X/week   Progress towards PT Goals Progress towards PT goals: Progressing toward goals  Plan Current plan remains appropriate    Precautions / Restrictions Precautions Precautions: Knee;Fall Required Braces or Orthoses: Knee Immobilizer - Right Knee Immobilizer - Right: Discontinue once straight leg raise with < 10 degree lag Restrictions Weight Bearing Restrictions: No Other Position/Activity Restrictions: WBAT   Pertinent Vitals/Pain 5/10; premed    Mobility  Bed Mobility Overal bed mobility: Needs Assistance Bed Mobility: Supine to Sit;Sit to Supine Supine to sit: Min assist Sit to supine: Min assist General bed mobility comments: min assist for R LE management Transfers Overall transfer level: Needs assistance Equipment used: Rolling walker (2 wheeled) Transfers: Sit to/from Stand Sit to Stand: Min guard General transfer comment: cues for hand placement Ambulation/Gait Ambulation/Gait assistance: Min guard;Supervision Ambulation Distance (Feet): 40 Feet Assistive device: Rolling walker (2 wheeled) Gait Pattern/deviations: Step-to pattern;Decreased step length - right;Decreased step length - left;Shuffle;Antalgic;Trunk flexed Gait velocity: decr General Gait Details: cues for posture, sequence and position from RW Stairs: Yes Stairs assistance: Min assist Stair Management:  Backwards Number of Stairs: 1 General stair comments: performed 3 x with spouse assisting on final attempt    Exercises Total Joint Exercises Ankle Circles/Pumps: AROM;Supine;10 reps;Both Quad Sets: AROM;Both;Supine;10 reps Heel Slides: AAROM;Right;Supine;10 reps Straight Leg Raises: AAROM;Right;Supine;10 reps   PT Diagnosis:    PT Problem List:   PT Treatment Interventions:     PT Goals (current goals can now be found in the care plan section) Acute Rehab PT Goals Patient Stated Goal: Resume previous lifestyle with decreased pain PT Goal Formulation: With patient Time For Goal Achievement: 08/17/13 Potential to Achieve Goals: Good  Visit Information  Last PT Received On: 08/14/13 Assistance Needed: +1 History of Present Illness: R TKA    Subjective Data  Subjective: This is better than yesterday Patient Stated Goal: Resume previous lifestyle with decreased pain   Cognition  Cognition Arousal/Alertness: Awake/alert Behavior During Therapy: WFL for tasks assessed/performed Overall Cognitive Status: Within Functional Limits for tasks assessed    Balance     End of Session PT - End of Session Equipment Utilized During Treatment: Gait belt;Right knee immobilizer Activity Tolerance: Patient tolerated treatment well Patient left: in bed;with call bell/phone within reach;with family/visitor present Nurse Communication: Mobility status   GP     Rhonda Gregory 08/14/2013, 3:44 PM

## 2013-08-14 NOTE — Progress Notes (Signed)
   Subjective: 2 Days Post-Op Procedure(s) (LRB): RIGHT TOTAL KNEE ARTHROPLASTY (Right) Patient reports pain as mild.   Patient seen in rounds with Dr. Wynelle Link. Patient is well, and has had no acute complaints or problems Patient is ready to go home   Objective: Vital signs in last 24 hours: Temp:  [98.3 F (36.8 C)-99 F (37.2 C)] 98.3 F (36.8 C) (02/25 0631) Pulse Rate:  [55-104] 72 (02/25 0631) Resp:  [16-17] 16 (02/25 0631) BP: (118-138)/(66-81) 138/81 mmHg (02/25 0631) SpO2:  [95 %-99 %] 99 % (02/25 0631)  Intake/Output from previous day:  Intake/Output Summary (Last 24 hours) at 08/14/13 1028 Last data filed at 08/14/13 1001  Gross per 24 hour  Intake   1741 ml  Output   2300 ml  Net   -559 ml    Intake/Output this shift: Total I/O In: 120.5 [I.V.:120.5] Out: 200 [Urine:200]  Labs:  Recent Labs  08/13/13 0415 08/14/13 0433  HGB 10.6* 9.9*    Recent Labs  08/13/13 0415 08/14/13 0433  WBC 14.7* 19.5*  RBC 3.66* 3.41*  HCT 32.7* 30.5*  PLT 227 243    Recent Labs  08/13/13 0415 08/14/13 0433  NA 137 137  K 4.0 4.0  CL 99 100  CO2 25 25  BUN 20 15  CREATININE 0.55 0.53  GLUCOSE 195* 156*  CALCIUM 9.3 9.3   No results found for this basename: LABPT, INR,  in the last 72 hours  EXAM: General - Patient is Alert and Appropriate Extremity - Neurovascular intact Sensation intact distally Dorsiflexion/Plantar flexion intact Incision - clean, dry, no drainage Motor Function - intact, moving foot and toes well on exam.   Assessment/Plan: 2 Days Post-Op Procedure(s) (LRB): RIGHT TOTAL KNEE ARTHROPLASTY (Right) Procedure(s) (LRB): RIGHT TOTAL KNEE ARTHROPLASTY (Right) Past Medical History  Diagnosis Date  . PONV (postoperative nausea and vomiting)   . Hypertension   . Sleep apnea   . Heartburn   . Arthritis   . Cancer 1995    uterine   Principal Problem:   OA (osteoarthritis) of knee Active Problems:   Postoperative anemia due to  acute blood loss  Estimated body mass index is 26.59 kg/(m^2) as calculated from the following:   Height as of this encounter: 5' 1.5" (1.562 m).   Weight as of this encounter: 64.864 kg (143 lb). Up with therapy Discharge home with home health Diet - Cardiac diet Follow up - in 2 weeks Activity - WBAT Disposition - Home Condition Upon Discharge - Good D/C Meds - See DC Summary DVT Prophylaxis - Xarelto  Natalie Mceuen 08/14/2013, 10:28 AM

## 2013-09-02 ENCOUNTER — Ambulatory Visit: Payer: Medicare HMO | Attending: Orthopedic Surgery | Admitting: Physical Therapy

## 2013-09-02 DIAGNOSIS — M25669 Stiffness of unspecified knee, not elsewhere classified: Secondary | ICD-10-CM | POA: Insufficient documentation

## 2013-09-02 DIAGNOSIS — M25569 Pain in unspecified knee: Secondary | ICD-10-CM | POA: Insufficient documentation

## 2013-09-02 DIAGNOSIS — R609 Edema, unspecified: Secondary | ICD-10-CM | POA: Insufficient documentation

## 2013-09-02 DIAGNOSIS — IMO0001 Reserved for inherently not codable concepts without codable children: Secondary | ICD-10-CM | POA: Insufficient documentation

## 2013-09-04 ENCOUNTER — Ambulatory Visit: Payer: Medicare HMO | Admitting: Physical Therapy

## 2013-09-05 ENCOUNTER — Ambulatory Visit: Payer: Medicare HMO | Admitting: Physical Therapy

## 2013-09-09 ENCOUNTER — Ambulatory Visit: Payer: Medicare HMO | Admitting: Physical Therapy

## 2013-09-11 ENCOUNTER — Ambulatory Visit: Payer: Medicare HMO | Admitting: Physical Therapy

## 2013-09-13 ENCOUNTER — Other Ambulatory Visit: Payer: Self-pay | Admitting: Internal Medicine

## 2013-09-13 ENCOUNTER — Ambulatory Visit
Admission: RE | Admit: 2013-09-13 | Discharge: 2013-09-13 | Disposition: A | Discharge status: Home / Self Care | Payer: Medicare HMO | Source: Ambulatory Visit | Attending: Internal Medicine | Admitting: Internal Medicine

## 2013-09-13 ENCOUNTER — Ambulatory Visit: Payer: Medicare HMO | Admitting: Physical Therapy

## 2013-09-13 DIAGNOSIS — R634 Abnormal weight loss: Secondary | ICD-10-CM

## 2013-09-13 DIAGNOSIS — R109 Unspecified abdominal pain: Secondary | ICD-10-CM

## 2013-09-13 MED ORDER — IOHEXOL 300 MG/ML  SOLN
100.0000 mL | Freq: Once | INTRAMUSCULAR | Status: AC | PRN
  Administered 2013-09-13: 100 mL via INTRAVENOUS

## 2013-09-16 ENCOUNTER — Encounter: Payer: Self-pay | Admitting: Gastroenterology

## 2013-09-16 ENCOUNTER — Ambulatory Visit: Payer: Medicare HMO | Admitting: Physical Therapy

## 2013-09-17 ENCOUNTER — Ambulatory Visit: Payer: Medicare HMO | Admitting: Physical Therapy

## 2013-09-18 ENCOUNTER — Ambulatory Visit: Payer: Medicare HMO | Attending: Orthopedic Surgery | Admitting: Physical Therapy

## 2013-09-18 DIAGNOSIS — R609 Edema, unspecified: Secondary | ICD-10-CM | POA: Insufficient documentation

## 2013-09-18 DIAGNOSIS — IMO0001 Reserved for inherently not codable concepts without codable children: Secondary | ICD-10-CM | POA: Insufficient documentation

## 2013-09-18 DIAGNOSIS — M25569 Pain in unspecified knee: Secondary | ICD-10-CM | POA: Insufficient documentation

## 2013-09-18 DIAGNOSIS — M25669 Stiffness of unspecified knee, not elsewhere classified: Secondary | ICD-10-CM | POA: Insufficient documentation

## 2013-09-19 ENCOUNTER — Ambulatory Visit (INDEPENDENT_AMBULATORY_CARE_PROVIDER_SITE_OTHER): Payer: Medicare HMO | Admitting: Physician Assistant

## 2013-09-19 ENCOUNTER — Encounter: Payer: Self-pay | Admitting: Physician Assistant

## 2013-09-19 ENCOUNTER — Telehealth: Payer: Self-pay | Admitting: Physician Assistant

## 2013-09-19 VITALS — BP 120/64 | HR 96 | Ht 61.0 in | Wt 142.1 lb

## 2013-09-19 DIAGNOSIS — Z8542 Personal history of malignant neoplasm of other parts of uterus: Secondary | ICD-10-CM | POA: Insufficient documentation

## 2013-09-19 DIAGNOSIS — R1013 Epigastric pain: Secondary | ICD-10-CM

## 2013-09-19 DIAGNOSIS — R634 Abnormal weight loss: Secondary | ICD-10-CM

## 2013-09-19 DIAGNOSIS — K5730 Diverticulosis of large intestine without perforation or abscess without bleeding: Secondary | ICD-10-CM

## 2013-09-19 DIAGNOSIS — K2990 Gastroduodenitis, unspecified, without bleeding: Principal | ICD-10-CM

## 2013-09-19 DIAGNOSIS — K2970 Gastritis, unspecified, without bleeding: Secondary | ICD-10-CM

## 2013-09-19 MED ORDER — SUCRALFATE 1 GM/10ML PO SUSP: 1.0000 g | Freq: Four times a day (QID) | ORAL | Status: DC

## 2013-09-19 MED ORDER — MAGIC MOUTHWASH: ORAL | Status: DC

## 2013-09-19 MED ORDER — DEXLANSOPRAZOLE 60 MG PO CPDR
60.0000 mg | DELAYED_RELEASE_CAPSULE | Freq: Every day | ORAL | Status: DC
Start: 2013-09-19 — End: 2013-10-09

## 2013-09-19 NOTE — Patient Instructions (Signed)
Stop Naproxen   We have sent the following medications to your pharmacy for you to pick up at your convenience: Carafate Liquid, please take four times daily between meals and at bedtime for 3 weeks   Magic Mouthwash, please swish and swallow for ten days  Please continue Dexilant for six weeks, new prescription was sent to your pharmacy

## 2013-09-19 NOTE — Progress Notes (Addendum)
Subjective:    Patient ID: Rhonda Gregory, female    DOB: 1941/05/28, 73 y.o.   MRN: [id number]  HPI  Rhonda Gregory is a pleasant 73 year old white female previously known to Dr. Sharlett Iles and primary patient of Dr. Virgina Jock who has been in generally good health. She reports that she had a very remote history of peptic ulcer disease, and has remote history of colon polyps. She also has family history of colon cancer personal history of uterine cancer for which she is status post hysterectomy about 20 years ago. She had undergone a total right knee replacement in February of 2015 and says she was doing fine until after her knee replacement.. She had been taking NSAIDs on a fairly regular basis prior to her surgery and has been taking naproxen at home after surgery. She also was discharged from the hospital and a lot of which she says she took for about 3 weeks and she feels that this had "messed up on GI tract". She has now been suffering with complete lack of appetite and a constant gnawing discomfort in her upper abdomen. She said food has no taste area she has not had any nausea or vomiting but has had a lot of burping belching and gas. She is having fairly regular bowel movements. Should a brief episode of diarrhea last week but feels that that was infectious because her husband also had a. She is distressed about her ongoing GI symptoms which don't seem to be improving. She has been  off the Dilaudid now for the past couple of weeks. She is now taking Dexilant 60 mg by mouth every morning over the past week but has continued on naproxen.She states she's  Going  on vacation to [place] next week and doesn't want to feel awful. She has not noted any melena or hematochezia.  Last colonoscopy was done in June of 2011 showed moderate diverticulosis of the left colon no polyps and was recommended for five-year interval followup  She did undergo CT scan of the abdomen and pelvis on 09/13/2013 because of her complaints  of abdominal pain this showed her to be post hysterectomy sigmoid diverticulosis without evidence of diverticulitis and otherwise negative exam. She has also had baseline labs per Dr. Keane Police office last week and reports that those were unremarkable.    Review of Systems  Constitutional: Positive for appetite change, fatigue and unexpected weight change.  HENT: Negative.   Eyes: Negative.   Respiratory: Negative.   Gastrointestinal: Positive for nausea and abdominal pain.  Endocrine: Negative.   Genitourinary: Negative.   Musculoskeletal: Positive for arthralgias, gait problem and joint swelling.  Skin: Negative.   Allergic/Immunologic: Negative.   Neurological: Negative.   Hematological: Negative.   Psychiatric/Behavioral: Negative.    Outpatient Prescriptions Prior to Visit  Medication Sig Dispense Refill  . lisinopril-hydrochlorothiazide (PRINZIDE,ZESTORETIC) 20-12.5 MG per tablet Take 1 tablet by mouth 2 (two) times daily.      . ondansetron (ZOFRAN) 4 MG tablet Take 1 tablet (4 mg total) by mouth every 6 (six) hours as needed for nausea.  60 tablet  0  . simvastatin (ZOCOR) 20 MG tablet Take 20 mg by mouth every evening.      . zolpidem (AMBIEN) 10 MG tablet Take 5 mg by mouth at bedtime as needed for sleep.      Marland Kitchen HYDROmorphone (DILAUDID) 2 MG tablet Take 1-2 tablets (2-4 mg total) by mouth every 3 (three) hours as needed for moderate pain or severe pain.  80 tablet  0  . methocarbamol (ROBAXIN) 500 MG tablet Take 1 tablet (500 mg total) by mouth every 6 (six) hours as needed for muscle spasms.  80 tablet  0  . rivaroxaban (XARELTO) 10 MG TABS tablet Take 1 tablet (10 mg total) by mouth daily with breakfast. Take Xarelto for two and a half more weeks, then discontinue Xarelto. Once the patient has completed the blood thinner regimen, then take a Baby 81 mg Aspirin daily for four more weeks.  19 tablet  0   No facility-administered medications prior to visit.   Allergies    Allergen Reactions  . Codeine     REACTION: nauseated  . Dilaudid [Hydromorphone Hcl] Nausea Only and Other (See Comments)    Abdominal pains  . Tramadol Nausea Only   Patient Active Problem List   Diagnosis Date Noted  . History of uterine cancer 09/19/2013  . Diverticulosis of colon without hemorrhage 09/19/2013  . Postoperative anemia due to acute blood loss 08/13/2013  . OA (osteoarthritis) of knee 08/12/2013   History  Substance Use Topics  . Smoking status: Former Smoker -- 1.00 packs/day for 4 years    Types: Cigarettes    Quit date: 08/08/1960  . Smokeless tobacco: Never Used  . Alcohol Use: Yes     Comment: 1-2 glasses wine weekly   family history includes Breast cancer in her mother; Colon cancer in her mother; Diabetes in her father; Heart attack (age of onset: 35) in her father.     Objective:   Physical Exam Well-developed older white female in no acute distress, pleasant blood pressure 120/64 pulse 96 height 5 foot 1 weight 142. HEENT; nontraumatic normocephalic EOMI PERRLA sclera anicteric oral mucosa moist tongue is coated with a whitish gray exudate, Neck; supple no JVD, Cardiovascular; regular rate and rhythm with S1-S2 no murmur or gallop, Pulmonary ;clear bilaterally, Abdomen ;soft she is tender across the upper abdomen there is no guarding or rebound no palpable mass or hepatosplenomegaly bowel sounds are present, Rectal ;exam not done, Extremities ;she is status post right knee replacement incision is healing, Psych ;mood and affect appropriate       Assessment & Plan:  #55 73 year old female 6 weeks status post total right knee replacement with 4 week history of epigastric and upper abdominal pain gnawing nausea and decrease in appetite I suspect she has an NSAID-induced gastropathy or peptic ulcer disease.  #2 probable oral thrush #3 diverticulosis #4 family history of colon cancer-due for followup colonoscopy 2016 #5 remote history of uterine cancer  status post hysterectomy #6 remote history of peptic ulcer disease  Plan; continue Dexilant 60  mg every morning Continue Pepcid 20 mg each bedtime x2 more weeks Discontinue naproxen and any other anti-inflammatories Tylenol as needed for pain Start Magic mouthwash 5 cc swish and swallow 4 times a day x10 days Start Carafate liquid 1 g 4 times daily between meals and at bedtime x2-3 weeks until symptoms are significantly improved Bland diet with gradual advancement If patient's symptoms persist when she returns from her vacation to [place] she will need to be scheduled for EGD, hopefully she will be much improved at that point She will be established with Dr. Hilarie Fredrickson for ongoing GI care   Addendum: Reviewed and agree with initial management. Jerene Bears, MD

## 2013-09-19 NOTE — Telephone Encounter (Signed)
Carafate suspension not covered by her insurance.  I have called in tablets.  I have instructed her to make a slurry by soaking in 2 tbsp of water and drinking.

## 2013-09-23 ENCOUNTER — Encounter: Payer: Self-pay | Admitting: Physician Assistant

## 2013-09-23 ENCOUNTER — Telehealth: Payer: Self-pay | Admitting: Physician Assistant

## 2013-09-23 ENCOUNTER — Ambulatory Visit: Payer: Medicare HMO | Admitting: Physical Therapy

## 2013-09-23 MED ORDER — MAGIC MOUTHWASH: ORAL | Status: DC

## 2013-09-23 NOTE — Telephone Encounter (Signed)
Error

## 2013-09-23 NOTE — Telephone Encounter (Signed)
Resent prescription to [facility]

## 2013-09-24 ENCOUNTER — Ambulatory Visit: Payer: Medicare HMO | Admitting: Physical Therapy

## 2013-09-25 ENCOUNTER — Ambulatory Visit: Payer: Medicare HMO | Admitting: Physical Therapy

## 2013-09-26 ENCOUNTER — Ambulatory Visit: Payer: Medicare HMO | Admitting: Physical Therapy

## 2013-09-26 ENCOUNTER — Telehealth: Payer: Self-pay | Admitting: Physician Assistant

## 2013-09-26 NOTE — Telephone Encounter (Signed)
Patient left voicemail to call back and speak with her husband. I have spoken to Mr Emmerich. He states that patient has not improved since office visit (09/19/13). However, they are currently in [place] and will not return until 10/07/13. Per Amy's office note 09/19/13, patient would probably need EGD if not better on her return from [place]. Patient is asking about setting up EGD. Therefore, I have gone forward with scheduling EGD on 10/09/13 and previsit 10/08/13. Dr Hilarie Fredrickson, are you agreeable to this plan?

## 2013-09-26 NOTE — Telephone Encounter (Signed)
Left message for patient to call back  

## 2013-09-27 NOTE — Telephone Encounter (Signed)
Yes, okay for EGD

## 2013-10-06 ENCOUNTER — Other Ambulatory Visit: Payer: Self-pay | Admitting: Orthopedic Surgery

## 2013-10-08 ENCOUNTER — Ambulatory Visit: Payer: Medicare HMO | Admitting: Physical Therapy

## 2013-10-08 ENCOUNTER — Ambulatory Visit (AMBULATORY_SURGERY_CENTER): Payer: Self-pay | Admitting: *Deleted

## 2013-10-08 VITALS — Ht 61.0 in | Wt 142.0 lb

## 2013-10-08 DIAGNOSIS — R1013 Epigastric pain: Secondary | ICD-10-CM

## 2013-10-08 NOTE — Progress Notes (Addendum)
No egg or soy allergy  No home oxygen use   No medications for weight loss taken  emmi information given  Pt does have nausea after sedation  She had recent right knee surgery- please be careful with positioning of that leg- she is very nervous about this  Pt's paperwork corrected per J Riling- pt is to be here at 7:30 for an 8:30 a.m. appointment

## 2013-10-09 ENCOUNTER — Encounter: Payer: Medicare HMO | Admitting: Internal Medicine

## 2013-10-09 ENCOUNTER — Encounter: Payer: Self-pay | Admitting: Internal Medicine

## 2013-10-09 ENCOUNTER — Ambulatory Visit (AMBULATORY_SURGERY_CENTER): Payer: Medicare HMO | Admitting: Internal Medicine

## 2013-10-09 VITALS — BP 109/76 | HR 90 | Temp 98.1°F | Resp 17 | Ht 61.0 in | Wt 142.0 lb

## 2013-10-09 DIAGNOSIS — R1013 Epigastric pain: Secondary | ICD-10-CM

## 2013-10-09 DIAGNOSIS — K2940 Chronic atrophic gastritis without bleeding: Secondary | ICD-10-CM

## 2013-10-09 DIAGNOSIS — R109 Unspecified abdominal pain: Secondary | ICD-10-CM

## 2013-10-09 MED ORDER — SODIUM CHLORIDE 0.9 % IV SOLN: 500.0000 mL | INTRAVENOUS | Status: DC

## 2013-10-09 MED ORDER — PANTOPRAZOLE SODIUM 40 MG PO TBEC: 40.0000 mg | DELAYED_RELEASE_TABLET | Freq: Every day | ORAL | Status: DC

## 2013-10-09 NOTE — Patient Instructions (Signed)
YOU HAD AN ENDOSCOPIC PROCEDURE TODAY AT THE [facility]: Refer to the procedure report that was given to you for any specific questions about what was found during the examination.  If the procedure report does not answer your questions, please call your gastroenterologist to clarify.  If you requested that your care partner not be given the details of your procedure findings, then the procedure report has been included in a sealed envelope for you to review at your convenience later.  YOU SHOULD EXPECT: Some feelings of bloating in the abdomen. Passage of more gas than usual.  Walking can help get rid of the air that was put into your GI tract during the procedure and reduce the bloating. If you had a lower endoscopy (such as a colonoscopy or flexible sigmoidoscopy) you may notice spotting of blood in your stool or on the toilet paper. If you underwent a bowel prep for your procedure, then you may not have a normal bowel movement for a few days.  DIET: Your first meal following the procedure should be a light meal and then it is ok to progress to your normal diet.  A half-sandwich or bowl of soup is an example of a good first meal.  Heavy or fried foods are harder to digest and may make you feel nauseous or bloated.  Likewise meals heavy in dairy and vegetables can cause extra gas to form and this can also increase the bloating.  Drink plenty of fluids but you should avoid alcoholic beverages for 24 hours.  ACTIVITY: Your care partner should take you home directly after the procedure.  You should plan to take it easy, moving slowly for the rest of the day.  You can resume normal activity the day after the procedure however you should NOT DRIVE or use heavy machinery for 24 hours (because of the sedation medicines used during the test).    SYMPTOMS TO REPORT IMMEDIATELY: A gastroenterologist can be reached at any hour.  During normal business hours, 8:30 AM to 5:00 PM Monday through Friday,  call [phone].  After hours and on weekends, please call the GI answering service at [phone] who will take a message and have the physician on call contact you.    Following upper endoscopy (EGD)  Vomiting of blood or coffee ground material  New chest pain or pain under the shoulder blades  Painful or persistently difficult swallowing  New shortness of breath  Fever of 100F or higher  Black, tarry-looking stools  FOLLOW UP: If any biopsies were taken you will be contacted by phone or by letter within the next 1-3 weeks.  Call your gastroenterologist if you have not heard about the biopsies in 3 weeks.  Our staff will call the home number listed on your records the next business day following your procedure to check on you and address any questions or concerns that you may have at that time regarding the information given to you following your procedure. This is a courtesy call and so if there is no answer at the home number and we have not heard from you through the emergency physician on call, we will assume that you have returned to your regular daily activities without incident.  SIGNATURES/CONFIDENTIALITY: You and/or your care partner have signed paperwork which will be entered into your electronic medical record.  These signatures attest to the fact that that the information above on your After Visit Summary has been reviewed and is understood.  Full   responsibility of the confidentiality of this discharge information lies with you and/or your care-partner.   Resume medications. Information given on gastritis with discharge instructions. 

## 2013-10-09 NOTE — Progress Notes (Signed)
Called to room to assist during endoscopic procedure.  Patient ID and intended procedure confirmed with present staff. Received instructions for my participation in the procedure from the performing physician.  

## 2013-10-09 NOTE — Progress Notes (Signed)
Lidocaine-40mg IV prior to Propofol InductionPropofol given over incremental dosages 

## 2013-10-09 NOTE — Op Note (Signed)
[place]  [address] [place], [id number]   ENDOSCOPY PROCEDURE REPORT  PATIENT: Rhonda, Gregory  MR#: #[id number] BIRTHDATE: May 04, 1941 , 72  yrs. old GENDER: Female ENDOSCOPIST: Jerene Bears, MD PROCEDURE DATE:  10/09/2013 PROCEDURE:  EGD w/ biopsy for H.pylori ASA CLASS:     Class III INDICATIONS:  Epigastric pain. MEDICATIONS: MAC sedation, administered by CRNA and propofol (Diprivan) 100mg  IV TOPICAL ANESTHETIC: none  DESCRIPTION OF PROCEDURE: After the risks benefits and alternatives of the procedure were thoroughly explained, informed consent was obtained.  The LB [id number] endoscope was introduced through the mouth and advanced to the second portion of the duodenum. Without limitations.  The instrument was slowly withdrawn as the mucosa was fully examined.     ESOPHAGUS: The mucosa of the esophagus appeared normal.   Regular Z-line at 35 cm.  STOMACH: Mild gastritis (inflammation) characterized by erythema and mild edema was found in the gastric antrum.  Biopsies were taken in the antrum and angularis.   Remainder of stomach mucosa normal.  DUODENUM: Mild duodenal inflammation was found in the duodenal bulb. Normal mucosa in the 2nd portion.  Retroflexed views revealed no abnormalities.     The scope was then withdrawn from the patient and the procedure completed.  COMPLICATIONS: There were no complications.   ENDOSCOPIC IMPRESSION: 1.   The mucosa of the esophagus appeared normal; regular Z-line 2.   Gastritis (inflammation) was found in the gastric antrum; biopsies were taken in the antrum and angularis 3.   Duodenal inflammation was found in the duodenal bulb; normal mucosa of the 2nd part of duodenum  RECOMMENDATIONS: 1.  Await biopsy results 2.  Follow-up of helicobacter pylori status, treat if indicated 3.  Due to expense, change to pantoprazole 40 mg once daily 30 minutes before breakfast.  Can continue Pepcid at  bedtime and Carafate (sucralfate) as needed only.  eSigned:  Jerene Bears, MD 10/09/2013 8:55 AM   CC:The Patient, Trellis Paganini, Amy PA-C, and Shon Baton, MD  PATIENT NAME:  Rhonda, Gregory MR#: #[id number]

## 2013-10-10 ENCOUNTER — Telehealth: Payer: Self-pay | Admitting: *Deleted

## 2013-10-10 NOTE — Telephone Encounter (Signed)
  Follow up Call-  Call back number 10/09/2013  Post procedure Call Back phone  # [phone]  Permission to leave phone message Yes     Patient questions:  Do you have a fever, pain , or abdominal swelling? no Pain Score  0 *  Have you tolerated food without any problems? yes  Have you been able to return to your normal activities? yes  Do you have any questions about your discharge instructions: Diet   no Medications  no Follow up visit  no  Do you have questions or concerns about your Care? no  Actions: * If pain score is 4 or above: No action needed, pain <4.

## 2013-10-15 ENCOUNTER — Ambulatory Visit: Payer: Medicare HMO | Admitting: Physical Therapy

## 2013-10-17 ENCOUNTER — Ambulatory Visit: Payer: Medicare HMO | Admitting: Physical Therapy

## 2013-10-21 ENCOUNTER — Encounter: Payer: Self-pay | Admitting: Internal Medicine

## 2013-10-22 ENCOUNTER — Ambulatory Visit: Payer: Medicare HMO | Attending: Orthopedic Surgery | Admitting: Physical Therapy

## 2013-10-22 DIAGNOSIS — M25569 Pain in unspecified knee: Secondary | ICD-10-CM | POA: Insufficient documentation

## 2013-10-22 DIAGNOSIS — R609 Edema, unspecified: Secondary | ICD-10-CM | POA: Insufficient documentation

## 2013-10-22 DIAGNOSIS — M25669 Stiffness of unspecified knee, not elsewhere classified: Secondary | ICD-10-CM | POA: Insufficient documentation

## 2013-10-22 DIAGNOSIS — IMO0001 Reserved for inherently not codable concepts without codable children: Secondary | ICD-10-CM | POA: Insufficient documentation

## 2013-10-29 ENCOUNTER — Ambulatory Visit: Payer: Medicare HMO | Admitting: Physical Therapy

## 2013-11-01 ENCOUNTER — Ambulatory Visit: Payer: Medicare HMO | Admitting: Physical Therapy

## 2014-10-27 ENCOUNTER — Encounter: Payer: Self-pay | Admitting: Internal Medicine

## 2015-06-03 ENCOUNTER — Encounter: Payer: Self-pay | Admitting: Internal Medicine

## 2015-06-11 ENCOUNTER — Ambulatory Visit (AMBULATORY_SURGERY_CENTER): Payer: Self-pay

## 2015-06-11 VITALS — Ht 60.75 in | Wt 154.0 lb

## 2015-06-11 DIAGNOSIS — Z860100 Personal history of colon polyps, unspecified: Secondary | ICD-10-CM

## 2015-06-11 NOTE — Progress Notes (Signed)
No allergies to eggs or soy No past problems with anesthesia except PONV with general No diet/weight loss meds No home oxygen  Has email and internet; registered for emmi for colonoscopy

## 2015-06-25 ENCOUNTER — Encounter: Payer: Self-pay | Admitting: Internal Medicine

## 2015-06-25 ENCOUNTER — Ambulatory Visit (AMBULATORY_SURGERY_CENTER): Payer: Medicare Other | Admitting: Internal Medicine

## 2015-06-25 VITALS — BP 108/80 | HR 70 | Temp 97.8°F | Resp 12 | Ht 60.0 in | Wt 154.0 lb

## 2015-06-25 DIAGNOSIS — Z86010 Personal history of colonic polyps: Secondary | ICD-10-CM | POA: Diagnosis not present

## 2015-06-25 DIAGNOSIS — Z800 Family history of malignant neoplasm of digestive organs: Secondary | ICD-10-CM

## 2015-06-25 MED ORDER — SODIUM CHLORIDE 0.9 % IV SOLN: 500.0000 mL | INTRAVENOUS | Status: DC

## 2015-06-25 NOTE — Op Note (Signed)
[place]  [address]. [place], [id number]   COLONOSCOPY PROCEDURE REPORT  PATIENT: Rhonda, Gregory  MR#: [id number] BIRTHDATE: 03/14/1941 , 74  yrs. old GENDER: female ENDOSCOPIST: Jerene Bears, MD PROCEDURE DATE:  06/25/2015 PROCEDURE:   Colonoscopy, surveillance First Screening Colonoscopy - Avg.  risk and is 50 yrs.  old or older - No.  Prior Negative Screening - Now for repeat screening. N/A  History of Adenoma - Now for follow-up colonoscopy & has been > or = to 3 yrs.  Yes hx of adenoma.  Has been 3 or more years since last colonoscopy.  Polyps removed today? No Recommend repeat exam, <10 yrs? Yes high risk ASA CLASS:   Class II INDICATIONS:Surveillance due to prior colonic neoplasia, PH Colon Adenoma (2003, and FH Colon or Rectal Adenocarcinoma (father), last colonoscopy no polyps 2011 with Dr. Sharlett Iles. MEDICATIONS: Monitored anesthesia care and Propofol 250 mg IV  DESCRIPTION OF PROCEDURE:   After the risks benefits and alternatives of the procedure were thoroughly explained, informed consent was obtained.  The digital rectal exam revealed no rectal mass.   The LB [id number]  endoscope was introduced through the anus and advanced to the cecum, which was identified by both the appendix and ileocecal valve. No adverse events experienced. The quality of the prep was good.  (MiraLax was used)  The instrument was then slowly withdrawn as the colon was fully examined. Estimated blood loss is zero unless otherwise noted in this procedure report.      COLON FINDINGS: There was mild diverticulosis noted in the sigmoid colon.   The examination was otherwise normal.  No polyps seen. Retroflexed views revealed internal hemorrhoids. The time to cecum = 3.1 Withdrawal time = 8.6   The scope was withdrawn and the procedure completed. COMPLICATIONS: There were no immediate complications.  ENDOSCOPIC IMPRESSION: 1.   Mild diverticulosis was noted in  the sigmoid colon 2.   The examination was otherwise normal  RECOMMENDATIONS: 1.  High fiber diet 2.  Repeat Colonoscopy in 5 years based on overall health at that time.  Office visit prior to repeat procedure.  eSigned:  Jerene Bears, MD 06/25/2015 9:15 AM   cc: the patient, Dr. Virgina Jock

## 2015-06-25 NOTE — Patient Instructions (Signed)
YOU HAD AN ENDOSCOPIC PROCEDURE TODAY AT THE [facility]:   Refer to the procedure report that was given to you for any specific questions about what was found during the examination.  If the procedure report does not answer your questions, please call your gastroenterologist to clarify.  If you requested that your care partner not be given the details of your procedure findings, then the procedure report has been included in a sealed envelope for you to review at your convenience later.  YOU SHOULD EXPECT: Some feelings of bloating in the abdomen. Passage of more gas than usual.  Walking can help get rid of the air that was put into your GI tract during the procedure and reduce the bloating. If you had a lower endoscopy (such as a colonoscopy or flexible sigmoidoscopy) you may notice spotting of blood in your stool or on the toilet paper. If you underwent a bowel prep for your procedure, you may not have a normal bowel movement for a few days.  Please Note:  You might notice some irritation and congestion in your nose or some drainage.  This is from the oxygen used during your procedure.  There is no need for concern and it should clear up in a day or so.  SYMPTOMS TO REPORT IMMEDIATELY:   Following lower endoscopy (colonoscopy or flexible sigmoidoscopy):  Excessive amounts of blood in the stool  Significant tenderness or worsening of abdominal pains  Swelling of the abdomen that is new, acute  Fever of 100F or higher    For urgent or emergent issues, a gastroenterologist can be reached at any hour by calling [phone].   DIET: Your first meal following the procedure should be a small meal and then it is ok to progress to your normal diet. Heavy or fried foods are harder to digest and may make you feel nauseous or bloated.  Likewise, meals heavy in dairy and vegetables can increase bloating.  Drink plenty of fluids but you should avoid alcoholic beverages for 24  hours.  ACTIVITY:  You should plan to take it easy for the rest of today and you should NOT DRIVE or use heavy machinery until tomorrow (because of the sedation medicines used during the test).    FOLLOW UP: Our staff will call the number listed on your records the next business day following your procedure to check on you and address any questions or concerns that you may have regarding the information given to you following your procedure. If we do not reach you, we will leave a message.  However, if you are feeling well and you are not experiencing any problems, there is no need to return our call.  We will assume that you have returned to your regular daily activities without incident.  If any biopsies were taken you will be contacted by phone or by letter within the next 1-3 weeks.  Please call us at [phone] if you have not heard about the biopsies in 3 weeks.    SIGNATURES/CONFIDENTIALITY: You and/or your care partner have signed paperwork which will be entered into your electronic medical record.  These signatures attest to the fact that that the information above on your After Visit Summary has been reviewed and is understood.  Full responsibility of the confidentiality of this discharge information lies with you and/or your care-partner.   Resume medications. Information given on diverticulosis and high fiber diet. 

## 2015-06-25 NOTE — Progress Notes (Signed)
Stable to RR 

## 2015-06-26 ENCOUNTER — Telehealth: Payer: Self-pay

## 2015-06-26 NOTE — Telephone Encounter (Signed)
  Follow up Call-  Call back number 06/25/2015 10/09/2013  Post procedure Call Back phone  # [phone]  Permission to leave phone message Yes Yes     Patient questions:  Do you have a fever, pain , or abdominal swelling? No. Pain Score  0 *  Have you tolerated food without any problems? Yes.    Have you been able to return to your normal activities? Yes.    Do you have any questions about your discharge instructions: Diet   No. Medications  No. Follow up visit  No.  Do you have questions or concerns about your Care? No.  Actions: * If pain score is 4 or above: No action needed, pain <4.

## 2016-07-15 ENCOUNTER — Other Ambulatory Visit: Payer: Self-pay | Admitting: Internal Medicine

## 2016-07-15 DIAGNOSIS — M542 Cervicalgia: Secondary | ICD-10-CM

## 2016-07-15 DIAGNOSIS — M4712 Other spondylosis with myelopathy, cervical region: Secondary | ICD-10-CM

## 2016-07-17 ENCOUNTER — Other Ambulatory Visit: Payer: Medicare Other

## 2016-07-18 ENCOUNTER — Ambulatory Visit
Admission: RE | Admit: 2016-07-18 | Discharge: 2016-07-18 | Disposition: A | Discharge status: Home / Self Care | Payer: Medicare Other | Source: Ambulatory Visit | Attending: Internal Medicine | Admitting: Internal Medicine

## 2016-07-18 ENCOUNTER — Other Ambulatory Visit: Payer: Self-pay | Admitting: Internal Medicine

## 2016-07-18 DIAGNOSIS — M542 Cervicalgia: Secondary | ICD-10-CM

## 2016-07-18 DIAGNOSIS — M4712 Other spondylosis with myelopathy, cervical region: Secondary | ICD-10-CM

## 2016-08-09 ENCOUNTER — Ambulatory Visit: Payer: Medicare Other | Attending: Neurosurgery | Admitting: Physical Therapy

## 2016-08-09 ENCOUNTER — Encounter: Payer: Self-pay | Admitting: Physical Therapy

## 2016-08-09 DIAGNOSIS — R252 Cramp and spasm: Secondary | ICD-10-CM

## 2016-08-09 DIAGNOSIS — M542 Cervicalgia: Secondary | ICD-10-CM | POA: Insufficient documentation

## 2016-08-09 NOTE — Therapy (Signed)
[place] [address] [place], [place], [address] Phone: [phone]   Fax:  [phone]  Physical Therapy Evaluation  Patient Details  Name: Rhonda Gregory MRN: [id number] Date of Birth: 02-20-1941 Referring Provider: Kathyrn Sheriff  Encounter Date: 08/09/2016      PT End of Session - 08/09/16 1554    Visit Number 1   Date for PT Re-Evaluation 10/07/16   PT Start Time 1530   PT Stop Time 1623   PT Time Calculation (min) 53 min   Activity Tolerance Patient tolerated treatment well   Behavior During Therapy [facility] for tasks assessed/performed      Past Medical History:  Diagnosis Date  . Arthritis   . Colon polyps   . Diverticulosis   . Gastric ulcer   . Heartburn   . HLD (hyperlipidemia)   . Hypertension   . PONV (postoperative nausea and vomiting)   . Sleep apnea    wears CPAP  . Uterine cancer ([place]) 1995    Past Surgical History:  Procedure Laterality Date  . ABDOMINAL HYSTERECTOMY     with salpingooophorectomy  . COLONOSCOPY W/ POLYPECTOMY    . DUPUYTREN CONTRACTURE RELEASE Bilateral   . HEMORRHOID SURGERY    . KNEE ARTHROSCOPY Right   . 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       Subjective Assessment - 08/09/16 1528    Subjective Patient reports that about 4 weeks ago she was helping her husband lift a chair.  She reports that a few days later she started having severe neck pain.  She reports prednisone helps, but she is still hurting.  X-ray showed DDD and stenosis.   Patient Stated Goals have less pain   Currently in Pain? Yes   Pain Score 1    Pain Location Neck   Pain Orientation Right;Left   Pain Descriptors / Indicators Aching;Tightness;Sore   Pain Type Acute pain   Pain Onset More than a month ago    Pain Frequency Intermittent   Aggravating Factors  bending, lifting grandchild pain was up to 10/10   Pain Relieving Factors the prednisone really helps   Effect of Pain on Daily Activities could not do much            [facility] PT Assessment - 08/09/16 0001      Assessment   Medical Diagnosis cervical spondylosis   Referring Provider Nundkumar   Onset Date/Surgical Date 07/09/16   Hand Dominance Right     Precautions   Precautions None     Balance Screen   Has the patient fallen in the past 6 months No   Has the patient had a decrease in activity level because of a fear of falling?  No   Is the patient reluctant to leave their home because of a fear of falling?  No     Home Environment   Additional Comments reports that she does care for grandchildren at times     Prior Function   Level of Independence Independent   Vocation Retired   Leisure chair yoga, some exercises at the gym     Posture/Postural Control   Posture Comments fwd head, rounded shoulders     ROM / Strength   AROM / PROM / Strength AROM;Strength  AROM   Overall AROM Comments cervcial ROM WNL's for flexion, decreased 75% for extension and side bending, rotation was decreased 50% all with c/o tightness, Lumbar ROM was decreased 25%     Strength   Overall Strength Comments 4-/5 with some pain in the neck     Palpation   Palpation comment she is vdry tight with spasms in the upper traps and cervical parapsinals, pain in the rhomboids     Special Tests    Special Tests --  manula distraction "felt good"                   [facility] Adult PT Treatment/Exercise - 08/09/16 0001      Modalities   Modalities Electrical Stimulation;Moist Heat     Moist Heat Therapy   Number Minutes Moist Heat 15 Minutes   Moist Heat Location Cervical     Electrical Stimulation   Electrical Stimulation Location C/T area   Electrical Stimulation Action IFC   Electrical Stimulation Parameters supine    Electrical Stimulation Goals Pain                PT Education - 08/09/16 1554    Education provided Yes   Education Details cervical and scapular retraction, shoulder shrugs   Person(s) Educated Patient   Methods Explanation;Demonstration;Handout   Comprehension Verbalized understanding          PT Short Term Goals - 08/09/16 1557      PT SHORT TERM GOAL #1   Title independent with initial HEP   Time 2   Period Weeks   Status New           PT Long Term Goals - 08/09/16 1557      PT LONG TERM GOAL #1   Title increase cervical ROM 25%   Time 8   Period Weeks   Status New     PT LONG TERM GOAL #2   Title decrease pain 50%   Time 8   Period Weeks   Status New     PT LONG TERM GOAL #3   Title report no difficulty driving the car   Time 8   Period Weeks   Status New     PT LONG TERM GOAL #4   Title lift grandchild without difficulty   Time 8   Period Weeks   Status New               Plan - 08/09/16 1555    Clinical Impression Statement Pateint reports that a month ago she had terrible pain after helping her husband move a chair.  She has some DDD and spondylosis.  She reports that taking prednisone has helped the pain.  She cares for grandchildren that are young and does lift them.   Rehab Potential Good   PT Frequency 2x / week   PT Duration 8 weeks   PT Treatment/Interventions ADLs/Self Care Home Management;Cryotherapy;Electrical Stimulation;Moist Heat;Traction;Ultrasound;Therapeutic activities;Therapeutic exercise;Neuromuscular re-education;Patient/family education;Manual techniques   PT Next Visit Plan slowly add exercises, could try traction   Consulted and Agree with Plan of Care Patient      Patient will benefit from skilled therapeutic intervention in order to improve the following deficits and impairments:  Decreased strength, Decreased range of motion, Improper body mechanics, Pain, Postural dysfunction, Increased muscle  spasms  Visit Diagnosis: Cervicalgia - Plan: PT plan of care cert/re-cert  Cramp and spasm - Plan: PT plan of care cert/re-cert      G-Codes - [id number]  [id number]    Functional Assessment Tool Used (Outpatient Only) foto 55% limitation   Functional Limitation Carrying, moving and handling objects   Carrying, Moving and Handling Objects Current Status [phone]) At least 40 percent but less than 60 percent impaired, limited or restricted   Carrying, Moving and Handling Objects Goal Status [id number]) At least 40 percent but less than 60 percent impaired, limited or restricted       Problem List Patient Active Problem List   Diagnosis Date Noted  . History of uterine cancer 09/19/2013  . Diverticulosis of colon without hemorrhage 09/19/2013  . Postoperative anemia due to acute blood loss 08/13/2013  . OA (osteoarthritis) of knee 08/12/2013    [name], PT 08/09/2016, 4:12 PM  [place] [address] [place], [place], [address] Phone: [phone]   Fax:  [phone]  Name: MADDIGAN LEGLEITER MRN: [id number] Date of Birth: Dec 06, 1940

## 2016-08-23 ENCOUNTER — Encounter: Payer: Self-pay | Admitting: Physical Therapy

## 2016-08-23 ENCOUNTER — Ambulatory Visit: Payer: Medicare Other | Attending: Neurosurgery | Admitting: Physical Therapy

## 2016-08-23 DIAGNOSIS — M542 Cervicalgia: Secondary | ICD-10-CM | POA: Insufficient documentation

## 2016-08-23 DIAGNOSIS — R252 Cramp and spasm: Secondary | ICD-10-CM | POA: Insufficient documentation

## 2016-08-23 NOTE — Therapy (Signed)
[place], [place], [address] Phone: [phone]   Fax:  [phone]  Physical Therapy Treatment  Patient Details  Name: Rhonda Gregory MRN: [id number] Date of Birth: 01-Dec-1940 Referring Provider: Kathyrn Sheriff  Encounter Date: 08/23/2016      PT End of Session - 08/23/16 0915    Visit Number 2   Date for PT Re-Evaluation 10/07/16   PT Start Time 0844   PT Stop Time 0940   PT Time Calculation (min) 56 min   Activity Tolerance Patient tolerated treatment well   Behavior During Therapy [facility] for tasks assessed/performed      Past Medical History:  Diagnosis Date  . Arthritis   . Colon polyps   . Diverticulosis   . Gastric ulcer   . Heartburn   . HLD (hyperlipidemia)   . Hypertension   . PONV (postoperative nausea and vomiting)   . Sleep apnea    wears CPAP  . Uterine cancer ([place]) 1995    Past Surgical History:  Procedure Laterality Date  . ABDOMINAL HYSTERECTOMY     with salpingooophorectomy  . COLONOSCOPY W/ POLYPECTOMY    . DUPUYTREN CONTRACTURE RELEASE Bilateral   . HEMORRHOID SURGERY    . KNEE ARTHROSCOPY Right   . 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      Subjective Assessment - 08/23/16 0845    Subjective Patient reports that she was out of town, has been trying to do the exercises, reports stiffness in the neck   Currently in Pain? Yes   Pain Score 2    Pain Location Neck   Pain Descriptors / Indicators Tightness;Sore                         [facility] Adult PT Treatment/Exercise - 08/23/16 0001      Exercises   Exercises Neck     Neck Exercises: Machines for Strengthening   Other Machines for Strengthening seated row 10#, lats 15# both 2x10     Neck Exercises:  Theraband   Shoulder Extension 20 reps;Red   Rows 20 reps;Red   Shoulder External Rotation 20 reps;Red     Neck Exercises: Standing   Neck Retraction 20 reps   Other Standing Exercises weighted ball overhead lift     Neck Exercises: Supine   Other Supine Exercise feet on ball K2C, trunk rotation and small bridges     Modalities   Modalities Traction     Moist Heat Therapy   Number Minutes Moist Heat 15 Minutes   Moist Heat Location Cervical;Lumbar Spine     Electrical Stimulation   Electrical Stimulation Location C/T area   Electrical Stimulation Action IFC   Electrical Stimulation Parameters supine   Electrical Stimulation Goals Pain     Traction   Type of Traction Cervical   Max (lbs) 14#   Hold Time static   Time 14                PT Education - 08/23/16 0914    Education provided Yes   Education Details redtband scapular retraction and ER   Person(s) Educated Patient   Methods Explanation;Demonstration;Verbal cues;Tactile cues;Handout   Comprehension Verbalized understanding;Returned demonstration  PT Short Term Goals - 08/23/16 0917      PT SHORT TERM GOAL #1   Title independent with initial HEP   Status Achieved           PT Long Term Goals - 08/09/16 1557      PT LONG TERM GOAL #1   Title increase cervical ROM 25%   Time 8   Period Weeks   Status New     PT LONG TERM GOAL #2   Title decrease pain 50%   Time 8   Period Weeks   Status New     PT LONG TERM GOAL #3   Title report no difficulty driving the car   Time 8   Period Weeks   Status New     PT LONG TERM GOAL #4   Title lift grandchild without difficulty   Time 8   Period Weeks   Status New               Plan - 08/23/16 0915    Clinical Impression Statement Patient with some weakness in the upper back, she has tightness and fatigue in the low back today from trying to do some yardwork over the weekend.   PT Next Visit Plan see if traction helps    Consulted and Agree with Plan of Care Patient      Patient will benefit from skilled therapeutic intervention in order to improve the following deficits and impairments:  Decreased strength, Decreased range of motion, Improper body mechanics, Pain, Postural dysfunction, Increased muscle spasms  Visit Diagnosis: Cervicalgia  Cramp and spasm     Problem List Patient Active Problem List   Diagnosis Date Noted  . History of uterine cancer 09/19/2013  . Diverticulosis of colon without hemorrhage 09/19/2013  . Postoperative anemia due to acute blood loss 08/13/2013  . OA (osteoarthritis) of knee 08/12/2013    [name], PT 08/23/2016, 9:17 AM  [place] [address] [place], [place], [address] Phone: [phone]   Fax:  [phone]  Name: Rhonda Gregory MRN: [id number] Date of Birth: [birth year]

## 2016-08-31 ENCOUNTER — Ambulatory Visit: Payer: Medicare Other | Admitting: Physical Therapy

## 2016-08-31 ENCOUNTER — Encounter: Payer: Self-pay | Admitting: Physical Therapy

## 2016-08-31 DIAGNOSIS — M542 Cervicalgia: Secondary | ICD-10-CM | POA: Diagnosis not present

## 2016-08-31 DIAGNOSIS — R252 Cramp and spasm: Secondary | ICD-10-CM

## 2016-08-31 NOTE — Therapy (Signed)
[place], [place], [address] Phone: [phone]   Fax:  [phone]  Physical Therapy Treatment  Patient Details  Name: Rhonda Gregory MRN: [id number] Date of Birth: [birth year] Referring Provider: Kathyrn Sheriff  Encounter Date: 08/31/2016      PT End of Session - 08/31/16 0914    Visit Number 3   Date for PT Re-Evaluation 10/07/16   PT Start Time 0839   PT Stop Time 0940   PT Time Calculation (min) 61 min   Activity Tolerance Patient tolerated treatment well   Behavior During Therapy [facility] for tasks assessed/performed      Past Medical History:  Diagnosis Date  . Arthritis   . Colon polyps   . Diverticulosis   . Gastric ulcer   . Heartburn   . HLD (hyperlipidemia)   . Hypertension   . PONV (postoperative nausea and vomiting)   . Sleep apnea    wears CPAP  . Uterine cancer ([place]) 1995    Past Surgical History:  Procedure Laterality Date  . ABDOMINAL HYSTERECTOMY     with salpingooophorectomy  . COLONOSCOPY W/ POLYPECTOMY    . DUPUYTREN CONTRACTURE RELEASE Bilateral   . HEMORRHOID SURGERY    . KNEE ARTHROSCOPY Right   . 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      Subjective Assessment - 08/31/16 0847    Subjective Patient reports that she is a little sore in the neck area, she has questions about her HEP   Currently in Pain? Yes   Pain Score 3    Pain Location Neck   Aggravating Factors  lifting   Pain Relieving Factors the heat feels good                         [facility] Adult PT Treatment/Exercise - 08/31/16 0001      Neck Exercises: Machines for Strengthening   UBE (Upper Arm Bike) Level 5 x 4 minutes   Other Machines for Strengthening seated row 10#, lats 15# both 2x10     Neck Exercises: Theraband   Shoulder Extension 20 reps;Red   Rows 20 reps;Red   Shoulder External Rotation 20 reps;Red     Neck Exercises: Standing   Other Standing Exercises weighted ball overhead lift   Other Standing Exercises 4# shrugs with upper trap and levator stretche3s     Neck Exercises: Supine   Neck Retraction 20 reps;3 secs   Neck Retraction Limitations head on ball   Other Supine Exercise feet on ball K2C, trunk rotation and small bridges     Moist Heat Therapy   Number Minutes Moist Heat 15 Minutes   Moist Heat Location Cervical;Lumbar Spine     Electrical Stimulation   Electrical Stimulation Location C/T area   Electrical Stimulation Action IFC   Electrical Stimulation Parameters supine   Electrical Stimulation Goals Pain     Traction   Type of Traction Cervical   Max (lbs) 14#   Hold Time static   Time 15                  PT Short Term Goals - 08/31/16 0254      PT SHORT TERM GOAL #1   Title independent with initial  HEP           PT Long Term Goals - 08/31/16 0919      PT LONG TERM GOAL #1   Title increase cervical ROM 25%   Status On-going               Plan - 08/31/16 0916    Clinical Impression Statement Patient with some difficulty with lifting granddaughter, she reports that her neck is stiff.  Has spasms int he upper traps and the necks   PT Next Visit Plan will slowly increase exercises   Consulted and Agree with Plan of Care Patient      Patient will benefit from skilled therapeutic intervention in order to improve the following deficits and impairments:  Decreased strength, Decreased range of motion, Improper body mechanics, Pain, Postural dysfunction, Increased muscle spasms  Visit Diagnosis: Cervicalgia  Cramp and spasm     Problem List Patient Active Problem List   Diagnosis Date Noted  . History of uterine cancer 09/19/2013  . Diverticulosis of colon without hemorrhage 09/19/2013  . Postoperative  anemia due to acute blood loss 08/13/2013  . OA (osteoarthritis) of knee 08/12/2013    [name], PT 08/31/2016, 9:19 AM  [place] [address] [place], [place], [address] Phone: [phone]   Fax:  [phone]  Name: Rhonda Gregory MRN: [id number] Date of Birth: 09-21-1940

## 2016-09-06 ENCOUNTER — Ambulatory Visit: Payer: Medicare Other | Admitting: Physical Therapy

## 2016-09-06 ENCOUNTER — Encounter: Payer: Self-pay | Admitting: Physical Therapy

## 2016-09-06 DIAGNOSIS — R252 Cramp and spasm: Secondary | ICD-10-CM

## 2016-09-06 DIAGNOSIS — M542 Cervicalgia: Secondary | ICD-10-CM

## 2016-09-06 NOTE — Therapy (Signed)
[place], [place], [address] Phone: [phone]   Fax:  [phone]  Physical Therapy Treatment  Patient Details  Name: Rhonda Gregory MRN: [id number] Date of Birth: 01/31/1941 Referring Provider: Kathyrn Sheriff  Encounter Date: 09/06/2016      PT End of Session - 09/06/16 0833    Visit Number 4   Date for PT Re-Evaluation 10/07/16   PT Start Time 0757   PT Stop Time 0900   PT Time Calculation (min) 63 min   Activity Tolerance Patient tolerated treatment well   Behavior During Therapy [facility] for tasks assessed/performed      Past Medical History:  Diagnosis Date  . Arthritis   . Colon polyps   . Diverticulosis   . Gastric ulcer   . Heartburn   . HLD (hyperlipidemia)   . Hypertension   . PONV (postoperative nausea and vomiting)   . Sleep apnea    wears CPAP  . Uterine cancer ([place]) 1995    Past Surgical History:  Procedure Laterality Date  . ABDOMINAL HYSTERECTOMY     with salpingooophorectomy  . COLONOSCOPY W/ POLYPECTOMY    . DUPUYTREN CONTRACTURE RELEASE Bilateral   . HEMORRHOID SURGERY    . KNEE ARTHROSCOPY Right   . 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      Subjective Assessment - 09/06/16 0757    Subjective I am a little stiff and sore in the morning.  Patient will be on vacation the rest of the week.   Currently in Pain? Yes   Pain Score 3    Pain Location Neck   Pain Orientation Right;Left   Aggravating Factors  AM worse            [facility] PT Assessment - 09/06/16 0001      AROM   Overall AROM Comments rotation now decreased 25% with c/o stiffness, sidebending still decreased 75%                     [facility] Adult PT Treatment/Exercise - 09/06/16 0001      Neck  Exercises: Machines for Strengthening   UBE (Upper Arm Bike) Level 5 x 6 minutes   Other Machines for Strengthening seated row 10#, lats 15# both 2x10   Other Machines for Strengthening 20# triceps and straigth arm pull downs     Neck Exercises: Theraband   Shoulder Extension 20 reps;Red   Rows 20 reps;Red   Shoulder External Rotation 20 reps;Red     Neck Exercises: Standing   Other Standing Exercises weighted ball overhead lift   Other Standing Exercises 4# shrugs with upper trap and levator stretche3s     Neck Exercises: Seated   W Back Weights (lbs) 1# 15 reps   Money 20 reps   Shoulder Rolls 20 reps     Neck Exercises: Supine   Neck Retraction 20 reps;3 secs   Neck Retraction Limitations head on ball     Moist Heat Therapy   Number Minutes Moist Heat 15 Minutes   Moist Heat Location Cervical;Lumbar Spine     Electrical Stimulation   Electrical Stimulation Location C/T area   Electrical Stimulation Action IFC   Electrical Stimulation Parameters supine   Electrical Stimulation Goals Pain  Traction   Type of Traction Cervical   Max (lbs) 15#   Hold Time static   Time 15                  PT Short Term Goals - 08/31/16 8118      PT SHORT TERM GOAL #1   Title independent with initial HEP           PT Long Term Goals - 09/06/16 0834      PT LONG TERM GOAL #1   Title increase cervical ROM 25%   Status Partially Met     PT LONG TERM GOAL #4   Title lift grandchild without difficulty   Status On-going               Plan - 09/06/16 8677    Clinical Impression Statement Patient with tightness in the upper traps.  Fatigues easily iwth activities above shoule level   PT Next Visit Plan may try STM   Consulted and Agree with Plan of Care Patient      Patient will benefit from skilled therapeutic intervention in order to improve the following deficits and impairments:  Decreased strength, Decreased range of motion, Improper body mechanics,  Pain, Postural dysfunction, Increased muscle spasms  Visit Diagnosis: Cervicalgia  Cramp and spasm     Problem List Patient Active Problem List   Diagnosis Date Noted  . History of uterine cancer 09/19/2013  . Diverticulosis of colon without hemorrhage 09/19/2013  . Postoperative anemia due to acute blood loss 08/13/2013  . OA (osteoarthritis) of knee 08/12/2013    [name], PT 09/06/2016, 8:35 AM  [place] [address] [place], [place], [address] Phone: [phone]   Fax:  [phone]  Name: GILBERTA PEETERS MRN: [id number] Date of Birth: [birth year]

## 2016-09-20 ENCOUNTER — Ambulatory Visit: Payer: Medicare Other | Attending: Neurosurgery | Admitting: Physical Therapy

## 2016-09-20 ENCOUNTER — Encounter: Payer: Self-pay | Admitting: Physical Therapy

## 2016-09-20 DIAGNOSIS — R252 Cramp and spasm: Secondary | ICD-10-CM | POA: Insufficient documentation

## 2016-09-20 DIAGNOSIS — M542 Cervicalgia: Secondary | ICD-10-CM | POA: Diagnosis not present

## 2016-09-20 NOTE — Therapy (Signed)
[place], [place], [address] Phone: [phone]   Fax:  [phone]  Physical Therapy Treatment  Patient Details  Name: TANIAYA RUDDER MRN: [id number] Date of Birth: Nov 12, 1940 Referring Provider: Kathyrn Sheriff  Encounter Date: 09/20/2016      PT End of Session - 09/20/16 0828    Visit Number 5   Date for PT Re-Evaluation 10/07/16   PT Start Time 0757   PT Stop Time 0852   PT Time Calculation (min) 55 min   Activity Tolerance Patient tolerated treatment well   Behavior During Therapy [facility] for tasks assessed/performed      Past Medical History:  Diagnosis Date  . Arthritis   . Colon polyps   . Diverticulosis   . Gastric ulcer   . Heartburn   . HLD (hyperlipidemia)   . Hypertension   . PONV (postoperative nausea and vomiting)   . Sleep apnea    wears CPAP  . Uterine cancer ([place]) 1995    Past Surgical History:  Procedure Laterality Date  . ABDOMINAL HYSTERECTOMY     with salpingooophorectomy  . COLONOSCOPY W/ POLYPECTOMY    . DUPUYTREN CONTRACTURE RELEASE Bilateral   . HEMORRHOID SURGERY    . KNEE ARTHROSCOPY Right   . 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      Subjective Assessment - 09/20/16 0801    Subjective Patient was in [place] she was doing some baby sitting for her grand kids, reports some stiffness in the wrist and overall fatigue   Currently in Pain? Yes   Pain Score 2    Pain Location Neck                         [facility] Adult PT Treatment/Exercise - 09/20/16 0001      Neck Exercises: Machines for Strengthening   UBE (Upper Arm Bike) Level 5 x 6 minutes     Neck Exercises: Theraband   Shoulder Extension 20 reps;Red   Rows 20 reps;Red   Shoulder External Rotation 20  reps;Red     Neck Exercises: Standing   Neck Retraction 20 reps   Other Standing Exercises 4# shrugs with upper trap and levator stretche3s     Neck Exercises: Seated   Money 20 reps   Shoulder Rolls 20 reps     Neck Exercises: Supine   Neck Retraction 20 reps;3 secs   Neck Retraction Limitations head on ball     Moist Heat Therapy   Number Minutes Moist Heat 15 Minutes   Moist Heat Location Cervical;Lumbar Spine     Electrical Stimulation   Electrical Stimulation Location C/T area   Electrical Stimulation Action IFC   Electrical Stimulation Parameters supine   Electrical Stimulation Goals Pain     Traction   Type of Traction Cervical   Max (lbs) 15#   Hold Time static   Time 15                  PT Short Term Goals - 08/31/16 0918      PT SHORT TERM GOAL #1   Title independent with initial HEP           PT Long Term Goals - 09/20/16 0830  PT LONG TERM GOAL #2   Title decrease pain 50%   Status Partially Met     PT LONG TERM GOAL #3   Title report no difficulty driving the car   Status Partially Met     PT LONG TERM GOAL #4   Title lift grandchild without difficulty   Status Achieved               Plan - 09/20/16 0829    Clinical Impression Statement Patient is very active in her life with travels.  She is overall doing a lot better, her neck is still very stiff with spasms, she has increased ROM.  Was able to lift grandchildren without much increase in pain   PT Next Visit Plan may try STM   Consulted and Agree with Plan of Care Patient      Patient will benefit from skilled therapeutic intervention in order to improve the following deficits and impairments:  Decreased strength, Decreased range of motion, Improper body mechanics, Pain, Postural dysfunction, Increased muscle spasms  Visit Diagnosis: Cervicalgia  Cramp and spasm     Problem List Patient Active Problem List   Diagnosis Date Noted  . History of uterine  cancer 09/19/2013  . Diverticulosis of colon without hemorrhage 09/19/2013  . Postoperative anemia due to acute blood loss 08/13/2013  . OA (osteoarthritis) of knee 08/12/2013    [name], PT 09/20/2016, 8:31 AM  [place] [address] [facility] [place], [place], [address] Phone: [phone]   Fax:  [phone]  Name: IMA HAFNER MRN: [id number] Date of Birth: 1941-01-12

## 2016-10-13 ENCOUNTER — Ambulatory Visit: Payer: Medicare Other | Admitting: Physical Therapy

## 2016-10-25 ENCOUNTER — Ambulatory Visit: Payer: Medicare Other | Admitting: Physical Therapy

## 2016-10-25 ENCOUNTER — Encounter: Payer: Medicare Other | Admitting: Physical Therapy

## 2016-11-15 ENCOUNTER — Ambulatory Visit: Payer: Medicare Other | Attending: Neurosurgery | Admitting: Physical Therapy

## 2016-11-29 ENCOUNTER — Ambulatory Visit: Payer: Medicare Other | Admitting: Physical Therapy

## 2016-12-12 ENCOUNTER — Ambulatory Visit: Payer: Medicare Other | Attending: Neurosurgery | Admitting: Physical Therapy

## 2016-12-12 ENCOUNTER — Encounter: Payer: Self-pay | Admitting: Physical Therapy

## 2016-12-12 DIAGNOSIS — R252 Cramp and spasm: Secondary | ICD-10-CM

## 2016-12-12 DIAGNOSIS — M542 Cervicalgia: Secondary | ICD-10-CM | POA: Insufficient documentation

## 2016-12-12 NOTE — Therapy (Signed)
[place], [place], [address] Phone: [phone]   Fax:  [phone]  Physical Therapy Treatment  Patient Details  Name: Rhonda Gregory MRN: [id number] Date of Birth: 04-Nov-1940 Referring Provider: Kathyrn Sheriff  Encounter Date: 12/12/2016      PT End of Session - 12/12/16 0816    Visit Number 6   Date for PT Re-Evaluation 01/11/17   PT Start Time 0756   PT Stop Time 0845   PT Time Calculation (min) 49 min   Activity Tolerance Patient tolerated treatment well   Behavior During Therapy [facility] for tasks assessed/performed      Past Medical History:  Diagnosis Date  . Arthritis   . Colon polyps   . Diverticulosis   . Gastric ulcer   . Heartburn   . HLD (hyperlipidemia)   . Hypertension   . PONV (postoperative nausea and vomiting)   . Sleep apnea    wears CPAP  . Uterine cancer ([place]) 1995    Past Surgical History:  Procedure Laterality Date  . ABDOMINAL HYSTERECTOMY     with salpingooophorectomy  . COLONOSCOPY W/ POLYPECTOMY    . DUPUYTREN CONTRACTURE RELEASE Bilateral   . HEMORRHOID SURGERY    . KNEE ARTHROSCOPY Right   . 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      Subjective Assessment - 12/12/16 0757    Subjective Patient has been doing a lot of stuff and is feeling pretty good.     Currently in Pain? Yes   Pain Score 2    Pain Location Knee   Pain Orientation Right;Left;Upper   Aggravating Factors  stress                         [facility] Adult PT Treatment/Exercise - 12/12/16 0001      Self-Care   Self-Care Other Self-Care Comments   Other Self-Care Comments  Went over stretches, the [employer] chair yoga class, posture and corner stretch     Moist Heat Therapy   Number Minutes Moist  Heat 15 Minutes   Moist Heat Location Cervical;Lumbar Spine     Electrical Stimulation   Electrical Stimulation Location C/T area   Electrical Stimulation Action IFC   Electrical Stimulation Parameters supine   Electrical Stimulation Goals Pain     Traction   Type of Traction Cervical   Max (lbs) 15#   Hold Time static   Time 15                  PT Short Term Goals - 08/31/16 1884      PT SHORT TERM GOAL #1   Title independent with initial HEP           PT Long Term Goals - 12/12/16 0818      PT LONG TERM GOAL #1   Title increase cervical ROM 25%   Status Achieved     PT LONG TERM GOAL #2   Title decrease pain 50%   Status Partially Met     PT LONG TERM GOAL #3   Title report no difficulty driving the car   Status Partially Met     PT LONG TERM GOAL #4   Title lift grandchild without difficulty  Status Achieved               Plan - 12/12/16 0816    Clinical Impression Statement Patient reports overall doing much better, less pain, still has tightness and some pain in the upper traps, the mms in the cervical area feel better.  She has been very active over the last few months with travels and reports that she will be travelling again, she is worried about stopping PT as she reports "great relief"   PT Frequency 1x / week   PT Duration 8 weeks   PT Treatment/Interventions ADLs/Self Care Home Management;Cryotherapy;Electrical Stimulation;Moist Heat;Traction;Ultrasound;Therapeutic activities;Therapeutic exercise;Neuromuscular re-education;Patient/family education;Manual techniques   PT Next Visit Plan she will again be travelling, we will try to see her when she is in town, try STM   Consulted and Agree with Plan of Care Patient      Patient will benefit from skilled therapeutic intervention in order to improve the following deficits and impairments:  Decreased strength, Decreased range of motion, Improper body mechanics, Pain, Postural  dysfunction, Increased muscle spasms  Visit Diagnosis: Cervicalgia - Plan: PT plan of care cert/re-cert  Cramp and spasm - Plan: PT plan of care cert/re-cert     Problem List Patient Active Problem List   Diagnosis Date Noted  . History of uterine cancer 09/19/2013  . Diverticulosis of colon without hemorrhage 09/19/2013  . Postoperative anemia due to acute blood loss 08/13/2013  . OA (osteoarthritis) of knee 08/12/2013    [name], PT 12/12/2016, 8:19 AM  [place] [address] [facility] [place], [place], [address] Phone: [phone]   Fax:  [phone]  Name: Rhonda Gregory MRN: [id number] Date of Birth: [birth year]

## 2016-12-15 ENCOUNTER — Ambulatory Visit: Payer: Medicare Other | Admitting: Physical Therapy

## 2017-01-17 ENCOUNTER — Encounter: Payer: Self-pay | Admitting: Physical Therapy

## 2017-01-17 ENCOUNTER — Ambulatory Visit: Payer: Medicare Other | Attending: Neurosurgery | Admitting: Physical Therapy

## 2017-01-17 DIAGNOSIS — M542 Cervicalgia: Secondary | ICD-10-CM | POA: Diagnosis not present

## 2017-01-17 DIAGNOSIS — R252 Cramp and spasm: Secondary | ICD-10-CM

## 2017-01-17 NOTE — Therapy (Signed)
[place], [place], [address] Phone: [phone]   Fax:  [phone]  Physical Therapy Treatment  Patient Details  Name: Rhonda Gregory MRN: [id number] Date of Birth: [birth year] Referring Provider: Kathyrn Sheriff  Encounter Date: 01/17/2017      PT End of Session - 01/17/17 0820    Visit Number 7   Date for PT Re-Evaluation 02/11/17   PT Start Time 0757   PT Stop Time 0846   PT Time Calculation (min) 49 min   Activity Tolerance Patient tolerated treatment well   Behavior During Therapy [facility] for tasks assessed/performed      Past Medical History:  Diagnosis Date  . Arthritis   . Colon polyps   . Diverticulosis   . Gastric ulcer   . Heartburn   . HLD (hyperlipidemia)   . Hypertension   . PONV (postoperative nausea and vomiting)   . Sleep apnea    wears CPAP  . Uterine cancer ([place]) 1995    Past Surgical History:  Procedure Laterality Date  . ABDOMINAL HYSTERECTOMY     with salpingooophorectomy  . COLONOSCOPY W/ POLYPECTOMY    . DUPUYTREN CONTRACTURE RELEASE Bilateral   . HEMORRHOID SURGERY    . KNEE ARTHROSCOPY Right   . 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      Subjective Assessment - 01/17/17 0818    Subjective Patient again has been travelling and reports that overall she is getting better, still reports tightness in the upper traps, recalls all exercises and stretches.   Currently in Pain? Yes   Pain Score 1    Pain Location Neck                         [facility] Adult PT Treatment/Exercise - 01/17/17 0001      Moist Heat Therapy   Number Minutes Moist Heat 15 Minutes   Moist Heat Location Cervical;Lumbar Spine     Electrical Stimulation   Electrical Stimulation Location  C/T area   Electrical Stimulation Action IFC   Electrical Stimulation Parameters supine   Electrical Stimulation Goals Pain     Traction   Type of Traction Cervical   Max (lbs) 15#   Hold Time static   Time 15     Manual Therapy   Manual Therapy Soft tissue mobilization   Soft tissue mobilization with vibration to the upper traps and rhomboids                  PT Short Term Goals - 08/31/16 0918      PT SHORT TERM GOAL #1   Title independent with initial HEP           PT Long Term Goals - 01/17/17 0824      PT LONG TERM GOAL #2   Title decrease pain 50%   Status Partially Met     PT LONG TERM GOAL #3   Title report no difficulty driving the car   Status Achieved               Plan - 01/17/17 0820    Clinical Impression Statement Patient has been doing a lot of travelling, she reports that the treatment that we do really helps, she  reports continued tightness in the mms of the right upper trap and the bilateral rhomboids.  She will continue to be out of town for the next 6 weeks,  She would like to leave her case open as she feels that this really is helping, when she returns from her trips she usually has good quesitons about what to do and how to do it , She is able to recall all the exercises and stretches.     PT Next Visit Plan she will again be travelling, we will try to see her when she is in town, try STM   Consulted and Agree with Plan of Care Patient      Patient will benefit from skilled therapeutic intervention in order to improve the following deficits and impairments:  Decreased strength, Decreased range of motion, Improper body mechanics, Pain, Postural dysfunction, Increased muscle spasms  Visit Diagnosis: Cervicalgia - Plan: PT plan of care cert/re-cert  Cramp and spasm - Plan: PT plan of care cert/re-cert     Problem List Patient Active Problem List   Diagnosis Date Noted  . History of uterine cancer 09/19/2013  .  Diverticulosis of colon without hemorrhage 09/19/2013  . Postoperative anemia due to acute blood loss 08/13/2013  . OA (osteoarthritis) of knee 08/12/2013    [name], PT 01/17/2017, 8:26 AM  [place] [address] [facility] [place], [place], [address] Phone: [phone]   Fax:  [phone]  Name: Rhonda Gregory MRN: [id number] Date of Birth: 1940-12-21

## 2017-03-07 ENCOUNTER — Ambulatory Visit: Payer: Medicare Other | Attending: Neurosurgery | Admitting: Physical Therapy

## 2017-03-07 ENCOUNTER — Encounter: Payer: Self-pay | Admitting: Physical Therapy

## 2017-03-07 DIAGNOSIS — M542 Cervicalgia: Secondary | ICD-10-CM | POA: Diagnosis not present

## 2017-03-07 DIAGNOSIS — R252 Cramp and spasm: Secondary | ICD-10-CM | POA: Diagnosis present

## 2017-03-07 NOTE — Therapy (Signed)
[place], [place], [address] Phone: [phone]   Fax:  [phone]  Physical Therapy Treatment  Patient Details  Name: Rhonda Gregory MRN: [id number] Date of Birth: 18-Nov-1940 Referring Provider: Kathyrn Sheriff  Encounter Date: 03/07/2017      PT End of Session - 03/07/17 0827    Visit Number 8   Date for PT Re-Evaluation 04/06/17   PT Start Time 0755   PT Stop Time 0855   PT Time Calculation (min) 60 min   Activity Tolerance Patient tolerated treatment well   Behavior During Therapy [facility] for tasks assessed/performed      Past Medical History:  Diagnosis Date  . Arthritis   . Colon polyps   . Diverticulosis   . Gastric ulcer   . Heartburn   . HLD (hyperlipidemia)   . Hypertension   . PONV (postoperative nausea and vomiting)   . Sleep apnea    wears CPAP  . Uterine cancer ([place]) 1995    Past Surgical History:  Procedure Laterality Date  . ABDOMINAL HYSTERECTOMY     with salpingooophorectomy  . COLONOSCOPY W/ POLYPECTOMY    . DUPUYTREN CONTRACTURE RELEASE Bilateral   . HEMORRHOID SURGERY    . KNEE ARTHROSCOPY Right   . 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      Subjective Assessment - 03/07/17 0756    Subjective Patient has been travelling over the past month.  She reports that she has been doing pretty good, but just a lot of walking and sitting has caused some increased pain.   Currently in Pain? Yes   Pain Score 3    Pain Location Neck   Pain Orientation Right;Left;Upper   Aggravating Factors  sitting, lifting   Pain Relieving Factors the treatment helps                         [facility] Adult PT Treatment/Exercise - 03/07/17 0001      Moist Heat Therapy   Number Minutes  Moist Heat 15 Minutes   Moist Heat Location Cervical;Lumbar Spine     Electrical Stimulation   Electrical Stimulation Location C/T area   Electrical Stimulation Action IFC   Electrical Stimulation Parameters supine   Electrical Stimulation Goals Pain     Traction   Type of Traction Cervical   Max (lbs) 16#   Hold Time static   Time 15     Manual Therapy   Manual Therapy Soft tissue mobilization   Soft tissue mobilization with vibration to the upper traps and rhomboids, use of roller on the ITB's                  PT Short Term Goals - 08/31/16 0918      PT SHORT TERM GOAL #1   Title independent with initial HEP           PT Long Term Goals - 03/07/17 8099      PT LONG TERM GOAL #1   Title increase cervical ROM 25%   Status Achieved     PT LONG TERM GOAL #2   Title decrease pain 50%   Status Partially Met     PT LONG TERM GOAL #3   Title report  no difficulty driving the car   Status Achieved     PT LONG TERM GOAL #4   Title lift grandchild without difficulty   Status Achieved               Plan - 03/07/17 0827    Clinical Impression Statement Patient has continued to travel over the past 6-7 weeks.  I have elected to keep her on our caselaod rather than to discharge and do evaluations when she returns from travelling.  Her symptoms are pretty consistent with mm spasms, and arthtitis.  She responds well to what we do.  Her ROM is much better.  She is doing well with her ability to travel, she has not been able to try the gym exercises that I showed her, she is doing the tband that I gave her for her HEP.   PT Frequency 1x / week   PT Duration 8 weeks   PT Treatment/Interventions ADLs/Self Care Home Management;Cryotherapy;Electrical Stimulation;Moist Heat;Traction;Ultrasound;Therapeutic activities;Therapeutic exercise;Neuromuscular re-education;Patient/family education;Manual techniques   PT Next Visit Plan she will again be travelling, we will try  to see her when she is in town, try STM   Consulted and Agree with Plan of Care Patient      Patient will benefit from skilled therapeutic intervention in order to improve the following deficits and impairments:  Decreased strength, Decreased range of motion, Improper body mechanics, Pain, Postural dysfunction, Increased muscle spasms  Visit Diagnosis: Cervicalgia - Plan: PT plan of care cert/re-cert  Cramp and spasm - Plan: PT plan of care cert/re-cert     Problem List Patient Active Problem List   Diagnosis Date Noted  . History of uterine cancer 09/19/2013  . Diverticulosis of colon without hemorrhage 09/19/2013  . Postoperative anemia due to acute blood loss 08/13/2013  . OA (osteoarthritis) of knee 08/12/2013    [name], PT 03/07/2017, 8:35 AM  [place] [address] [facility] [place], [place], [address] Phone: [phone]   Fax:  [phone]  Name: Rhonda Gregory MRN: [id number] Date of Birth: 10-31-1940

## 2017-05-02 ENCOUNTER — Encounter: Payer: Self-pay | Admitting: Physical Therapy

## 2017-05-02 ENCOUNTER — Ambulatory Visit: Payer: Medicare Other | Attending: Neurosurgery | Admitting: Physical Therapy

## 2017-05-02 DIAGNOSIS — R252 Cramp and spasm: Secondary | ICD-10-CM | POA: Insufficient documentation

## 2017-05-02 DIAGNOSIS — M542 Cervicalgia: Secondary | ICD-10-CM | POA: Diagnosis not present

## 2017-05-02 NOTE — Therapy (Signed)
[place], [place], [address] Phone: [phone]   Fax:  [phone]  Physical Therapy Treatment  Patient Details  Name: Rhonda Gregory MRN: [id number] Date of Birth: 06/03/1941 Referring Provider: Kathyrn Sheriff   Encounter Date: 05/02/2017  PT End of Session - 05/02/17 0824    Visit Number  9    Date for PT Re-Evaluation  06/01/17    PT Start Time  6333    PT Stop Time  0849    PT Time Calculation (min)  54 min    Activity Tolerance  Patient tolerated treatment well    Behavior During Therapy  [facility] for tasks assessed/performed       Past Medical History:  Diagnosis Date  . Arthritis   . Colon polyps   . Diverticulosis   . Gastric ulcer   . Heartburn   . HLD (hyperlipidemia)   . Hypertension   . PONV (postoperative nausea and vomiting)   . Sleep apnea    wears CPAP  . Uterine cancer ([place]) 1995    Past Surgical History:  Procedure Laterality Date  . ABDOMINAL HYSTERECTOMY     with salpingooophorectomy  . COLONOSCOPY W/ POLYPECTOMY    . DUPUYTREN CONTRACTURE RELEASE Bilateral   . HEMORRHOID SURGERY    . KNEE ARTHROSCOPY Right     There were no vitals filed for this visit.  Subjective Assessment - 05/02/17 0800    Subjective  Patient again has been away the past month.  She reports doing some baby sitting in [place] and then some other travel    Currently in Pain?  Yes    Pain Score  4     Pain Location  Neck    Pain Descriptors / Indicators  Sore;Tightness    Aggravating Factors   sitting    Pain Relieving Factors  treatment                      [facility] Adult PT Treatment/Exercise - 05/02/17 0001      Moist Heat Therapy   Number Minutes Moist Heat  15 Minutes    Moist Heat Location  Cervical;Lumbar Spine      Electrical Stimulation   Electrical Stimulation Location  C/T area    Electrical Stimulation Action  IFC    Electrical Stimulation Parameters  supine     Electrical Stimulation Goals  Pain      Traction   Type of Traction  Cervical    Max (lbs)  16#    Hold Time  static    Time  15      Manual Therapy   Manual Therapy  Soft tissue mobilization    Manual therapy comments  PROM of the HS and ITB's    Soft tissue mobilization  with vibration to the upper traps and rhomboids, use of roller on the ITB's               PT Short Term Goals - 08/31/16 0918      PT SHORT TERM GOAL #1   Title  independent with initial HEP        PT Long Term Goals - 05/02/17 0828      PT LONG TERM GOAL #1   Title  increase cervical ROM 25%    Status  Achieved      PT LONG TERM GOAL #2   Title  decrease pain 50%    Status  Partially Met      PT LONG TERM GOAL #3   Title  report no difficulty driving the car    Status  Achieved      PT LONG TERM GOAL #4   Title  lift grandchild without difficulty    Status  Achieved            Plan - 05/02/17 0824    Clinical Impression Statement  Patient overall is doing well, she is travelling a lot and has had some increased pain in the neck and the ITB due to the sitting that she has done.  She reports that she does not think that she could have done as much as she has without having treatment.  I have elected to continue the treatment through this so we do not have to do a new evaluation everytime she returns.  She is very tight in the ITB and the upper traps, beside travelling she does a lot of sitting playing in bridge tournaments    PT Frequency  1x / week    PT Duration  4 weeks    PT Treatment/Interventions  ADLs/Self Care Home Management;Cryotherapy;Electrical Stimulation;Moist Heat;Traction;Ultrasound;Therapeutic activities;Therapeutic exercise;Neuromuscular re-education;Patient/family education;Manual techniques    PT Next Visit Plan  continue to see as she is available    Consulted and Agree with Plan of Care  Patient       Patient will benefit from skilled therapeutic intervention  in order to improve the following deficits and impairments:  Decreased strength, Decreased range of motion, Improper body mechanics, Pain, Postural dysfunction, Increased muscle spasms  Visit Diagnosis: Cervicalgia  Cramp and spasm     Problem List Patient Active Problem List   Diagnosis Date Noted  . History of uterine cancer 09/19/2013  . Diverticulosis of colon without hemorrhage 09/19/2013  . Postoperative anemia due to acute blood loss 08/13/2013  . OA (osteoarthritis) of knee 08/12/2013    [name], PT 05/02/2017, 8:29 AM  [place] [address] [place], [place], [address] Phone: [phone]   Fax:  [phone]  Name: Rhonda Gregory MRN: [id number] Date of Birth: [birth year]

## 2017-06-20 HISTORY — PX: CATARACT EXTRACTION, BILATERAL: SHX1313

## 2018-07-24 ENCOUNTER — Emergency Department (HOSPITAL_BASED_OUTPATIENT_CLINIC_OR_DEPARTMENT_OTHER): Payer: Medicare Other

## 2018-07-24 ENCOUNTER — Other Ambulatory Visit: Payer: Self-pay

## 2018-07-24 ENCOUNTER — Encounter (HOSPITAL_BASED_OUTPATIENT_CLINIC_OR_DEPARTMENT_OTHER): Payer: Self-pay | Admitting: Emergency Medicine

## 2018-07-24 ENCOUNTER — Emergency Department (HOSPITAL_BASED_OUTPATIENT_CLINIC_OR_DEPARTMENT_OTHER)
Admission: EM | Admit: 2018-07-24 | Discharge: 2018-07-24 | Disposition: A | Discharge status: Home / Self Care | Payer: Medicare Other | Attending: Emergency Medicine | Admitting: Emergency Medicine

## 2018-07-24 DIAGNOSIS — R5381 Other malaise: Secondary | ICD-10-CM | POA: Diagnosis not present

## 2018-07-24 DIAGNOSIS — R531 Weakness: Secondary | ICD-10-CM | POA: Diagnosis not present

## 2018-07-24 DIAGNOSIS — Z87891 Personal history of nicotine dependence: Secondary | ICD-10-CM | POA: Insufficient documentation

## 2018-07-24 DIAGNOSIS — R42 Dizziness and giddiness: Secondary | ICD-10-CM | POA: Diagnosis not present

## 2018-07-24 DIAGNOSIS — I10 Essential (primary) hypertension: Secondary | ICD-10-CM | POA: Insufficient documentation

## 2018-07-24 DIAGNOSIS — R110 Nausea: Secondary | ICD-10-CM | POA: Insufficient documentation

## 2018-07-24 DIAGNOSIS — R202 Paresthesia of skin: Secondary | ICD-10-CM | POA: Diagnosis not present

## 2018-07-24 DIAGNOSIS — Z79899 Other long term (current) drug therapy: Secondary | ICD-10-CM | POA: Insufficient documentation

## 2018-07-24 DIAGNOSIS — Z8542 Personal history of malignant neoplasm of other parts of uterus: Secondary | ICD-10-CM | POA: Insufficient documentation

## 2018-07-24 DIAGNOSIS — R0789 Other chest pain: Secondary | ICD-10-CM | POA: Diagnosis not present

## 2018-07-24 DIAGNOSIS — Z96651 Presence of right artificial knee joint: Secondary | ICD-10-CM | POA: Insufficient documentation

## 2018-07-24 LAB — CBC
HEMATOCRIT: 38 % (ref 36.0–46.0)
Hemoglobin: 12 g/dL (ref 12.0–15.0)
MCH: 28.7 pg (ref 26.0–34.0)
MCHC: 31.6 g/dL (ref 30.0–36.0)
MCV: 90.9 fL (ref 80.0–100.0)
Platelets: 248 10*3/uL (ref 150–400)
RBC: 4.18 MIL/uL (ref 3.87–5.11)
RDW: 13.2 % (ref 11.5–15.5)
WBC: 8.3 10*3/uL (ref 4.0–10.5)
nRBC: 0 % (ref 0.0–0.2)

## 2018-07-24 LAB — ETHANOL: Alcohol, Ethyl (B): <10 mg/dL (ref <10)

## 2018-07-24 LAB — PROTIME-INR
INR: 1.03
Prothrombin Time: 13.4 seconds (ref 11.4–15.2)

## 2018-07-24 LAB — COMPREHENSIVE METABOLIC PANEL
ALT: 23 U/L (ref 0–44)
AST: 23 U/L (ref 15–41)
Albumin: 3.9 g/dL (ref 3.5–5.0)
Alkaline Phosphatase: 73 U/L (ref 38–126)
Anion gap: 8 (ref 5–15)
BILIRUBIN TOTAL: 0.4 mg/dL (ref 0.3–1.2)
BUN: 26 mg/dL — AB (ref 8–23)
CHLORIDE: 102 mmol/L (ref 98–111)
CO2: 26 mmol/L (ref 22–32)
CREATININE: 0.8 mg/dL (ref 0.44–1.00)
Calcium: 9.6 mg/dL (ref 8.9–10.3)
GFR calc Af Amer: >60 mL/min (ref >60)
GLUCOSE: 132 mg/dL — AB (ref 70–99)
Potassium: 3.3 mmol/L — ABNORMAL LOW (ref 3.5–5.1)
Sodium: 136 mmol/L (ref 135–145)
TOTAL PROTEIN: 6.8 g/dL (ref 6.5–8.1)

## 2018-07-24 LAB — URINALYSIS, ROUTINE W REFLEX MICROSCOPIC
BILIRUBIN URINE: NEGATIVE
GLUCOSE, UA: NEGATIVE mg/dL
HGB URINE DIPSTICK: NEGATIVE
Ketones, ur: NEGATIVE mg/dL
Leukocytes, UA: NEGATIVE
Nitrite: NEGATIVE
Protein, ur: NEGATIVE mg/dL
SPECIFIC GRAVITY, URINE: 1.015 (ref 1.005–1.030)
pH: 7 (ref 5.0–8.0)

## 2018-07-24 LAB — DIFFERENTIAL
ABS IMMATURE GRANULOCYTES: 0.03 10*3/uL (ref 0.00–0.07)
Basophils Absolute: 0 10*3/uL (ref 0.0–0.1)
Basophils Relative: 0 %
Eosinophils Absolute: 0.1 10*3/uL (ref 0.0–0.5)
Eosinophils Relative: 2 %
Immature Granulocytes: 0 %
LYMPHS PCT: 44 %
Lymphs Abs: 3.6 10*3/uL (ref 0.7–4.0)
MONOS PCT: 10 %
Monocytes Absolute: 0.8 10*3/uL (ref 0.1–1.0)
NEUTROS ABS: 3.6 10*3/uL (ref 1.7–7.7)
Neutrophils Relative %: 44 %

## 2018-07-24 LAB — TROPONIN I

## 2018-07-24 LAB — RAPID URINE DRUG SCREEN, HOSP PERFORMED
AMPHETAMINES: NOT DETECTED
Barbiturates: NOT DETECTED
Benzodiazepines: NOT DETECTED
Cocaine: NOT DETECTED
Opiates: NOT DETECTED
Tetrahydrocannabinol: POSITIVE — AB

## 2018-07-24 LAB — CBG MONITORING, ED: Glucose-Capillary: 132 mg/dL — ABNORMAL HIGH (ref 70–99)

## 2018-07-24 LAB — APTT: aPTT: 28 seconds (ref 24–36)

## 2018-07-24 MED ORDER — MECLIZINE HCL 25 MG PO TABS: 25.0000 mg | ORAL_TABLET | Freq: Three times a day (TID) | ORAL | 0 refills | Status: DC | PRN

## 2018-07-24 MED ORDER — SODIUM CHLORIDE 0.9 % IV BOLUS
500.0000 mL | Freq: Once | INTRAVENOUS | Status: AC
  Administered 2018-07-24: 500 mL via INTRAVENOUS

## 2018-07-24 MED ORDER — MECLIZINE HCL 25 MG PO TABS
25.0000 mg | ORAL_TABLET | Freq: Once | ORAL | Status: AC
  Administered 2018-07-24: 25 mg via ORAL
  Filled 2018-07-24: qty 1

## 2018-07-24 MED ORDER — ONDANSETRON 8 MG PO TBDP: 8.0000 mg | ORAL_TABLET | Freq: Three times a day (TID) | ORAL | 0 refills | Status: DC | PRN

## 2018-07-24 MED ORDER — ONDANSETRON HCL 4 MG/2ML IJ SOLN
4.0000 mg | Freq: Once | INTRAMUSCULAR | Status: AC
  Administered 2018-07-24: 4 mg via INTRAVENOUS

## 2018-07-24 MED ORDER — ONDANSETRON 8 MG PO TBDP: 8.0000 mg | ORAL_TABLET | Freq: Three times a day (TID) | ORAL | 0 refills | Status: AC | PRN

## 2018-07-24 MED ORDER — MECLIZINE HCL 25 MG PO TABS: 25.0000 mg | ORAL_TABLET | Freq: Three times a day (TID) | ORAL | 0 refills | Status: AC | PRN

## 2018-07-24 MED ORDER — SODIUM CHLORIDE 0.9 % IV SOLN: 100.0000 mL/h | INTRAVENOUS | Status: DC

## 2018-07-24 MED ORDER — ONDANSETRON HCL 4 MG/2ML IJ SOLN
INTRAMUSCULAR | Status: AC
  Administered 2018-07-24: 4 mg via INTRAVENOUS
  Filled 2018-07-24: qty 2

## 2018-07-24 NOTE — ED Triage Notes (Addendum)
Pt reports sudden onset dizziness and blurred vision; sts blurry double vision at the time, but is somewhat better now; dizziness persistent; pt was scrubbing the floor when this occurred initially; reports tingling in all extremeties

## 2018-07-24 NOTE — Consult Note (Signed)
[facility] Tele[facility]urology Consult Services   Date of Service:   07/24/2018 13:15:00  Impression:     .  No clear evidence of acute stroke  Comments: Must consider the possibility that she became orthostatic. There are no focal findings suggest acute ischemic stroke at this time.  Mechanism of Stroke: Not Clear  Metrics: Last Known Well: 07/24/2018 12:15:00 [facility] Notification Time: 07/24/2018 13:14:25 Arrival Time: 07/24/2018 12:57:00 Stamp Time: 07/24/2018 13:15:00 Time First Login Attempt: 07/24/2018 13:20:03 Video Start Time: 07/24/2018 13:20:03  Symptoms: dizziness, numbness NIHSS Start Assessment Time: 07/24/2018 13:21:00 Patient is not a candidate for tPA. Patient was not deemed candidate for tPA thrombolytics because of NIHSS = 0. Video End Time: 07/24/2018 13:40:57  CT head showed no acute hemorrhage or acute core infarct. CT head was reviewed and results were: no evidence of acute ischemic change, hemorrhage, or mass lesion  Presentation is not suggestive of Large Vessel Occlusive disease.  Advanced imaging was not obtained as the presentation was not suggestive of Large Vessel Occlusive Disease.   ED Physician notified of diagnostic impression and management plan on 07/24/2018 13:40:00  Our recommendations are outlined below.  Recommendations:     .  Activate Stroke Protocol Admission/Order Set     .  Stroke/Telemetry Floor     .  Neuro Checks     .  Bedside Swallow Eval     .  DVT Prophylaxis     .  IV Fluids, Normal Saline     .  Head of Bed Below 30 Degrees     .  Euglycemia and Avoid Hyperthermia (PRN Acetaminophen)     .  MRI brain     .  Orthostatic BP/P    Sign Out:     .  Discussed with Emergency Department Provider    ------------------------------------------------------------------------------  History of Present Illness: Patient is a 78 year old Female.  Patient was brought by private transportation  with symptoms of dizziness, numbness  This 78 year old woman was well until 1215 when she stood up quickly and develop dizziness with tingling of both hands and both feet. She had no loss of speech. Her vision was foggy. She had no weakness the extremities. She has a history of hypertension. She has no history of diabetes, coronary disease, or atrial fibrillation. She has hyperlipidemia. She takes [place] but no anticoagulant. She has obstructive sleep apnea. She is a non-smoker.  CT head showed no acute hemorrhage or acute core infarct. CT head was reviewed.  Last seen normal was within 4.5 hours. There is no history of hemorrhagic complications or intracranial hemorrhage. There is no history of Recent Anticoagulants. There is no history of recent major surgery. There is no history of recent stroke.  Examination: BP(121/84), Pulse(72), Blood Glucose(132) 1A: Level of Consciousness - Alert; keenly responsive + 0 1B: Ask Month and Age - Both Questions Right + 0 1C: Blink Eyes & Squeeze Hands - Performs Both Tasks + 0 2: Test Horizontal Extraocular Movements - Normal + 0 3: Test Visual Fields - No Visual Loss + 0 4: Test Facial Palsy (Use Grimace if Obtunded) - Normal symmetry + 0 5A: Test Left Arm Motor Drift - No Drift for 10 Seconds + 0 5B: Test Right Arm Motor Drift - No Drift for 10 Seconds + 0 6A: Test Left Leg Motor Drift - No Drift for 5 Seconds + 0 6B: Test Right Leg Motor Drift - No Drift for 5 Seconds + 0 7: Test Limb Ataxia (FNF/Heel-Shin) -  No Ataxia + 0 8: Test Sensation - Normal; No sensory loss + 0 9: Test Language/Aphasia - Normal; No aphasia + 0 10: Test Dysarthria - Normal + 0 11: Test Extinction/Inattention - No abnormality + 0  NIHSS Score: 0  Patient was informed the Neurology Consult would happen via TeleHealth consult by way of interactive audio and video telecommunications and consented to receiving care in this manner.  Due to the immediate potential for  life-threatening deterioration due to underlying acute neurologic illness, I spent 35 minutes providing critical care. This time includes time for face to face visit via telemedicine, review of medical records, imaging studies and discussion of findings with providers, the patient and/or family.   Dr Cindie Laroche   [facility] [phone]  Case [id number]

## 2018-07-24 NOTE — ED Notes (Signed)
ED MD informed of pts c/o's

## 2018-07-24 NOTE — Discharge Instructions (Signed)
Take the medications as prescribed, follow-up with your primary care doctor if the symptoms persist , return to [facility] emergency room as needed for worsening symptoms

## 2018-07-24 NOTE — ED Notes (Signed)
NAD at this time. Pt is stable and going home.  

## 2018-07-24 NOTE — ED Provider Notes (Signed)
[facility] EMERGENCY DEPARTMENT Provider Note   CSN: [id number] Arrival date & time: 07/24/18  1253    History   Chief Complaint Chief Complaint  Patient presents with  . Dizziness  . Blurred Vision    HPI Rhonda Gregory is a 78 y.o. female.  HPI Presented to the emergency room for evaluation of acute onset of dizziness.  Patient states his symptoms started around may be 1215.  She had sudden onset of dizziness.  Patient felt that the room was spinning.  She also has some generalized weakness and malaise.  She feels nauseated.  She has tingling in her extremities.  The symptoms do not seem to change with position.  She feels the dizziness at rest as well as with movement.  She denies feeling off balance or having coordination issues.  She is not sure if she is having specific speech issues.  She does feel like she is speaking maybe a little bit more slowly.  She denies any headache.  She did have some chest discomfort that was mild but is not having any chest pain.  She denies any shortness of breath.  She denies any vomiting or diarrhea.  Patient was concerned about the possibly of heart attack or stroke so she came to the emergency room promptly.  Past Medical History:  Diagnosis Date  . Arthritis   . Colon polyps   . Diverticulosis   . Gastric ulcer   . Heartburn   . HLD (hyperlipidemia)   . Hypertension   . PONV (postoperative nausea and vomiting)   . Sleep apnea    wears CPAP  . Uterine cancer [facility]) 1995    Patient Active Problem List   Diagnosis Date Noted  . History of uterine cancer 09/19/2013  . Diverticulosis of colon without hemorrhage 09/19/2013  . Postoperative anemia due to acute blood loss 08/13/2013  . OA (osteoarthritis) of knee 08/12/2013    Past Surgical History:  Procedure Laterality Date  . ABDOMINAL HYSTERECTOMY     with salpingooophorectomy  . COLONOSCOPY W/ POLYPECTOMY    . DUPUYTREN CONTRACTURE RELEASE Bilateral   . HEMORRHOID  SURGERY    . KNEE ARTHROSCOPY Right   . 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   Procedure: RIGHT TOTAL KNEE ARTHROPLASTY;  Surgeon: [name], MD;  Location: [facility];  Service: Orthopedics;  Laterality: Right;     OB History   No obstetric history on file.      Home Medications    Prior to Admission medications   Medication Sig Start Date End Date Taking? Authorizing Provider  losartan-hydrochlorothiazide Konrad Penta) 50-12.5 MG tablet  06/09/15   [provider]  meclizine (ANTIVERT) 25 MG tablet Take 1 tablet (25 mg total) by mouth 3 (three) times daily as needed for dizziness. 07/24/18   Dorie Rank, MD  ondansetron (ZOFRAN ODT) 8 MG disintegrating tablet Take 1 tablet (8 mg total) by mouth every 8 (eight) hours as needed for nausea or vomiting. 07/24/18   Dorie Rank, MD  polyethylene glycol powder [facility]) powder Take 1 Container by mouth once. 238g bottlt    [provider]  simvastatin (ZOCOR) 20 MG tablet Take 20 mg by mouth every evening.    [provider]    Family History Family History  Problem Relation Age of Onset  . Breast cancer Mother   . Colon cancer Mother   . Heart attack Father 7  . Diabetes Father   . Esophageal cancer Neg Hx   .  Stomach cancer Neg Hx   . Rectal cancer Neg Hx     Social History Social History   Tobacco Use  . Smoking status: Former Smoker    Packs/day: 1.00    Years: 4.00    Pack years: 4.00    Types: Cigarettes    Last attempt to quit: 08/08/1960    Years since quitting: 57.9  . Smokeless tobacco: Never Used  Substance Use Topics  . Alcohol use: Yes    Comment: 1-2 glasses wine weekly- nothing in the last 3 months  . Drug use: No     Allergies   Codeine; Dilaudid [hydromorphone hcl]; and Tramadol   Review of Systems Review of Systems  All other systems reviewed and are negative.    Physical Exam Updated Vital Signs BP 140/72   Pulse 88   Temp (!) 97.5 F (36.4 C) (Oral)   Resp 18    Ht 1.562 m (5' 1.5")   Wt 68 kg   SpO2 97%   BMI 27.88 kg/m   Physical Exam Vitals signs and nursing note reviewed.  Constitutional:      General: She is not in acute distress.    Appearance: She is well-developed.  HENT:     Head: Normocephalic and atraumatic.     Right Ear: External ear normal.     Left Ear: External ear normal.  Eyes:     General: No scleral icterus.       Right eye: No discharge.        Left eye: No discharge.     Conjunctiva/sclera: Conjunctivae normal.  Neck:     Musculoskeletal: Neck supple.     Trachea: No tracheal deviation.  Cardiovascular:     Rate and Rhythm: Normal rate and regular rhythm.  Pulmonary:     Effort: Pulmonary effort is normal. No respiratory distress.     Breath sounds: Normal breath sounds. No stridor. No wheezing or rales.  Abdominal:     General: Bowel sounds are normal. There is no distension.     Palpations: Abdomen is soft.     Tenderness: There is no abdominal tenderness. There is no guarding or rebound.  Musculoskeletal:        General: No tenderness.  Skin:    General: Skin is warm and dry.     Findings: No rash.  Neurological:     Mental Status: She is alert and oriented to person, place, and time.     Cranial Nerves: No cranial nerve deficit (no facial droop, extraocular movements intact, no slurred speech).     Sensory: No sensory deficit.     Motor: No abnormal muscle tone or seizure activity.     Coordination: Coordination normal.     Comments: No pronator drift bilateral upper extrem, able to hold both legs off bed for 5 seconds, sensation intact in all extremities, no visual field cuts, no left or right sided neglect, abnormal finger-nose exam with the right hand, pass pointing noted,  nystagmus noted       ED Treatments / Results  Labs (all labs ordered are listed, but only abnormal results are displayed) Labs Reviewed  COMPREHENSIVE METABOLIC PANEL - Abnormal; Notable for the following components:       Result Value   Potassium 3.3 (*)    Glucose, Bld 132 (*)    BUN 26 (*)    All other components within normal limits  CBG MONITORING, ED - Abnormal; Notable for the following components:  Glucose-Capillary 132 (*)    All other components within normal limits  ETHANOL  PROTIME-INR  APTT  CBC  DIFFERENTIAL  TROPONIN I  RAPID URINE DRUG SCREEN, HOSP PERFORMED  URINALYSIS, ROUTINE W REFLEX MICROSCOPIC    EKG EKG Interpretation  Date/Time:  Tuesday July 24 2018 13:00:51 EST Ventricular Rate:  67 PR Interval:    QRS Duration: 109 QT Interval:  444 QTC Calculation: 469 R Axis:   -25 Text Interpretation:  Sinus rhythm Ventricular premature complex Incomplete RBBB and LAFB Low voltage, precordial leads Consider right ventricular hypertrophy Consider anterior infarct No old tracing to compare Confirmed by Dorie Rank [phone]) on 07/24/2018 1:06:16 PM   Radiology Ct Head Code Stroke Wo Contrast  Result Date: 07/24/2018 CLINICAL DATA:  Code stroke. Acute onset of dizziness with bilateral upper and lower extremity tingling. EXAM: CT HEAD WITHOUT CONTRAST TECHNIQUE: Contiguous axial images were obtained from the base of the skull through the vertex without intravenous contrast. COMPARISON:  None. FINDINGS: Brain: Mild atrophy and white matter changes are present bilaterally. No acute infarct, hemorrhage, or mass lesion is present. Basal ganglia are intact. Insular ribbon is normal. The brainstem and cerebellum are within normal limits. Vascular: Atherosclerotic calcifications are present within the cavernous internal carotid arteries. There is no hyperdense vessel. Skull: Calvarium is intact. No focal lytic or blastic lesions are present. Sinuses/Orbits: The paranasal sinuses and mastoid air cells are clear. Bilateral lens replacements are noted. Globes and orbits are otherwise unremarkable. ASPECTS [facility] Stroke Program Early CT Score) - Ganglionic level infarction (caudate, lentiform nuclei,  internal capsule, insula, M1-M3 cortex): 7/7 - Supraganglionic infarction (M4-M6 cortex): 3/3 Total score (0-10 with 10 being normal): 10/10 IMPRESSION: 1. Mild atrophy and white matter changes, likely within normal limits for age. 2. No acute intracranial abnormality 3. ASPECTS is 10/10 These results were called by telephone at the time of interpretation on 07/24/2018 at 1:48 pm to Dr. Dorie Rank , who verbally acknowledged these results. Electronically Signed   By: San Morelle M.D.   On: 07/24/2018 13:50    Procedures Procedures (including critical care time)  Medications Ordered in ED Medications  sodium chloride 0.9 % bolus 500 mL ( Intravenous Stopped 07/24/18 1448)    Followed by  0.9 %  sodium chloride infusion (has no administration in time range)  ondansetron (ZOFRAN) injection 4 mg (4 mg Intravenous Given 07/24/18 1315)  meclizine (ANTIVERT) tablet 25 mg (25 mg Oral Given 07/24/18 1417)     Initial Impression / Assessment and Plan / ED Course  I have reviewed the triage vital signs and the nursing notes.  Pertinent labs & imaging results that were available during my care of the patient were reviewed by me and considered in my medical decision making (see chart for details).  Clinical Course as of Jul 24 1530  Tue Jul 24, 2018  1321 Sx are not definitive for acute stroke but is concerning.  Only deficit on exam right now is the nystagmus and some difficulty with finger to nose.  Considering her early onset will activate code stroke, expedite workup.   [JK]  1344 Pt was assess by Dr Lucia Gaskins neurology.  Does not feel that patients symptoms are stroke related.  I will continue with her evaluation   [JK]    Clinical Course User Index [JK] Dorie Rank, MD    Patient presented to the emergency room for evaluation of acute dizziness associated with paresthesias.  Is concerned about her vertigo symptoms being a sign  of stroke.  Neurology was consulted as part of a code stroke  evaluation.  Appreciate Dr. Pollie Friar input.  He did not feel the patient symptoms were consistent with stroke TIA.  I do think the patient may be having some vertigo issues and paresthesias associated with that.  She was treated with antiemetics and meclizine.  Her symptoms have improved although not completely resolved.  At this point I doubt TIA stroke.  Cardiac work-up is reassuring and her vital signs.  Plan on discharge home with symptomatic treatment.  Warning signs and precautions discussed.  Final Clinical Impressions(s) / ED Diagnoses   Final diagnoses:  Vertigo    ED Discharge Orders         Ordered    meclizine (ANTIVERT) 25 MG tablet  3 times daily PRN     07/24/18 1528    ondansetron (ZOFRAN ODT) 8 MG disintegrating tablet  Every 8 hours PRN     07/24/18 1528           Dorie Rank, MD 07/24/18 [phone]

## 2018-07-24 NOTE — ED Notes (Signed)
Tele Neuro in progress 

## 2018-07-24 NOTE — ED Notes (Signed)
Patient transported to CT 

## 2018-07-24 NOTE — ED Notes (Signed)
Pt walked well to and from the bathroom.

## 2018-07-24 NOTE — ED Notes (Signed)
Pt sts dizziness is better, but still there.

## 2019-01-24 ENCOUNTER — Encounter: Payer: Self-pay | Admitting: Physical Therapy

## 2019-01-24 ENCOUNTER — Other Ambulatory Visit: Payer: Self-pay | Admitting: Physician Assistant

## 2019-01-24 ENCOUNTER — Other Ambulatory Visit (HOSPITAL_COMMUNITY): Payer: Self-pay | Admitting: Physician Assistant

## 2019-01-24 ENCOUNTER — Other Ambulatory Visit: Payer: Self-pay

## 2019-01-24 ENCOUNTER — Ambulatory Visit: Payer: Medicare Other | Attending: Physician Assistant | Admitting: Physical Therapy

## 2019-01-24 DIAGNOSIS — R262 Difficulty in walking, not elsewhere classified: Secondary | ICD-10-CM | POA: Diagnosis present

## 2019-01-24 DIAGNOSIS — Z96651 Presence of right artificial knee joint: Secondary | ICD-10-CM

## 2019-01-24 DIAGNOSIS — M25551 Pain in right hip: Secondary | ICD-10-CM | POA: Diagnosis present

## 2019-01-24 NOTE — Therapy (Signed)
[place] [address] [place] [place], [place], [address] Phone: [phone]   Fax:  [phone]  Physical Therapy Evaluation  Patient Details  Name: Rhonda Gregory MRN: [id number] Date of Birth: 01/03/1941 Referring Provider (PT): Chabon   Encounter Date: 01/24/2019  PT End of Session - 01/24/19 1044    Visit Number  1    Date for PT Re-Evaluation  03/26/19    PT Start Time  1020    PT Stop Time  1106    PT Time Calculation (min)  46 min    Activity Tolerance  Patient tolerated treatment well    Behavior During Therapy  [facility] for tasks assessed/performed       Past Medical History:  Diagnosis Date  . Arthritis   . Colon polyps   . Diverticulosis   . Gastric ulcer   . Heartburn   . HLD (hyperlipidemia)   . Hypertension   . PONV (postoperative nausea and vomiting)   . Sleep apnea    wears CPAP  . Uterine cancer ([place]) 1995    Past Surgical History:  Procedure Laterality Date  . ABDOMINAL HYSTERECTOMY     with salpingooophorectomy  . COLONOSCOPY W/ POLYPECTOMY    . DUPUYTREN CONTRACTURE RELEASE Bilateral   . HEMORRHOID SURGERY    . KNEE ARTHROSCOPY Right   . 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   Subjective Assessment - 01/24/19 1022    Subjective  Patient comes in with right hip pain, MD feels like it is bursitis.  She is unsure of a cause.  She reports that when the gyms closed she started walking, reports that about 1 month ago she really started having right hip pain that was severe and she at times was not able to walk dsue to the pain.    Limitations  Lifting;Standing;Walking;House hold activities    Patient Stated Goals  have less pain, walk better    Currently in Pain?  Yes    Pain Score  9     Pain Location  Hip     Pain Orientation  Right;Lateral    Pain Descriptors / Indicators  Aching;Radiating    Pain Type  Acute pain    Pain Radiating Towards  reports pain down the right anterior and lateral thigh some pain into the shin    Pain Onset  1 to 4 weeks ago    Pain Frequency  Constant    Aggravating Factors   walking, standing pain up to 10/10    Pain Relieving Factors  ice helps, rest helps at best pain a 5/10    Effect of Pain on Daily Activities  can't walk, difficulty doing all ADL's         [facility] PT Assessment - 01/24/19 0001      Assessment   Medical Diagnosis  right hip pain    Referring Provider (PT)  Chabon    Onset Date/Surgical Date  01/23/19    Prior Therapy  no      Precautions   Precautions  None      Balance Screen   Has the patient fallen in the past 6 months  No    Has the patient had a decrease in activity level because of a fear of falling?  No    Is the patient reluctant to leave their home because of a fear of falling?   No      Home Environment   Additional Comments  reports that she does care for grandchildren at times, does some yardwork and does the housework      Prior Function   Level of Independence  Independent    Vocation  Retired    Leisure  chair yoga, some exercises at [employer], was walking 1 mile a day      Posture/Postural Control   Posture Comments  fwd head, rounded shoulders      AROM   Overall AROM Comments  right hip is WFL's tight and sore with rotation, some pain with the motions      Strength   Overall Strength Comments  3+/5 right hip with pain for all motions      Palpation   Palpation comment  very tender in the right ITB and the right GT area, tender in the buttock as well, right knee is a little swollen and warm      Ambulation/Gait   Gait Comments  right limp, reports cannnot walk > 200 feet due to pain                Objective measurements completed on examination: See above findings.      [facility] Adult PT  Treatment/Exercise - 01/24/19 0001      Modalities   Modalities  Cryotherapy;Iontophoresis      Cryotherapy   Number Minutes Cryotherapy  15 Minutes    Cryotherapy Location  Hip    Type of Cryotherapy  Ice pack      Electrical Stimulation   Electrical Stimulation Location  right hip     Electrical Stimulation Action  IFC    Electrical Stimulation Parameters  supine    Electrical Stimulation Goals  Pain      Iontophoresis   Type of Iontophoresis  Dexamethasone    Location  right GT area    Dose  22mA    Time  4 hour patch             PT Education - 01/24/19 1044    Education provided  Yes    Education Details  ITB stretch    Person(s) Educated  Patient    Methods  Explanation;Demonstration;Handout    Comprehension  Verbalized understanding       PT Short Term Goals - 01/24/19 1047      PT SHORT TERM GOAL #1   Title  independent with initial HEP    Time  2    Period  Weeks    Status  New        PT Long Term Goals - 01/24/19 1047      PT LONG TERM GOAL #1   Title  increase lumbar ROM 25%    Time  8    Period  Weeks    Status  New      PT LONG TERM GOAL #2   Title  decrease pain 50%    Time  8    Period  Weeks    Status  New      PT LONG TERM GOAL #3   Title  report no difficulty sleeping    Time  8    Period  Weeks    Status  New      PT LONG TERM GOAL #4   Title  resume walking  Time  8    Period  Weeks    Status  New             Plan - 01/24/19 1045    Clinical Impression Statement  Patient reports that since the Covid pandemic she was not able to go to the gym, she reports that she started walking, reports tha tshe upped her distance and time and started haivng severe right hip pain, MD feels it is GT bursitis, she is very tender here and down the ITB, she is very tight in the HS and the ITB on the right, has some lumbar ROM limitation, right hips strength is 3+/5 due to pain, reports that she cannot walk or stand long due to pain     Stability/Clinical Decision Making  Stable/Uncomplicated    Clinical Decision Making  Low    Rehab Potential  Good    PT Frequency  2x / week    PT Duration  8 weeks    PT Treatment/Interventions  ADLs/Self Care Home Management;Cryotherapy;Electrical Stimulation;Moist Heat;Traction;Ultrasound;Therapeutic activities;Therapeutic exercise;Neuromuscular re-education;Patient/family education;Manual techniques;Iontophoresis 4mg /ml Dexamethasone;Gait training;Stair training;Functional mobility training;Dry needling    PT Next Visit Plan  will work on the flexibility and the pain    Consulted and Agree with Plan of Care  Patient       Patient will benefit from skilled therapeutic intervention in order to improve the following deficits and impairments:  Decreased strength, Decreased range of motion, Improper body mechanics, Pain, Postural dysfunction, Increased muscle spasms, Abnormal gait, Decreased mobility, Decreased activity tolerance, Decreased balance, Difficulty walking, Impaired flexibility  Visit Diagnosis: 1. Pain in right hip   2. Difficulty in walking, not elsewhere classified        Problem List Patient Active Problem List   Diagnosis Date Noted  . History of uterine cancer 09/19/2013  . Diverticulosis of colon without hemorrhage 09/19/2013  . Postoperative anemia due to acute blood loss 08/13/2013  . OA (osteoarthritis) of knee 08/12/2013    [name], PT 01/24/2019, 12:10 PM  [address] [place], [place], [address] Phone: [phone]   Fax:  [phone]  Name: Rhonda Gregory MRN: [id number] Date of Birth: 1941/06/17

## 2019-01-25 ENCOUNTER — Ambulatory Visit: Payer: Medicare Other | Admitting: Physical Therapy

## 2019-01-29 ENCOUNTER — Encounter: Payer: Self-pay | Admitting: Physical Therapy

## 2019-01-29 ENCOUNTER — Ambulatory Visit: Payer: Medicare Other | Admitting: Physical Therapy

## 2019-01-29 ENCOUNTER — Other Ambulatory Visit: Payer: Self-pay

## 2019-01-29 DIAGNOSIS — M25551 Pain in right hip: Secondary | ICD-10-CM

## 2019-01-29 DIAGNOSIS — R262 Difficulty in walking, not elsewhere classified: Secondary | ICD-10-CM

## 2019-01-29 NOTE — Therapy (Signed)
[place], [place], [address] Phone: [phone]   Fax:  [phone]  Physical Therapy Treatment  Patient Details  Name: Rhonda Gregory MRN: [id number] Date of Birth: [birth year] Referring Provider (PT): Chabon   Encounter Date: 01/29/2019  PT End of Session - 01/29/19 1136    Visit Number  2    Date for PT Re-Evaluation  03/26/19    PT Start Time  9326    PT Stop Time  1115    PT Time Calculation (min)  60 min    Activity Tolerance  Patient tolerated treatment well;Patient limited by pain    Behavior During Therapy  [facility] for tasks assessed/performed       Past Medical History:  Diagnosis Date  . Arthritis   . Colon polyps   . Diverticulosis   . Gastric ulcer   . Heartburn   . HLD (hyperlipidemia)   . Hypertension   . PONV (postoperative nausea and vomiting)   . Sleep apnea    wears CPAP  . Uterine cancer ([place]) 1995    Past Surgical History:  Procedure Laterality Date  . ABDOMINAL HYSTERECTOMY     with salpingooophorectomy  . COLONOSCOPY W/ POLYPECTOMY    . DUPUYTREN CONTRACTURE RELEASE Bilateral   . HEMORRHOID SURGERY    . KNEE ARTHROSCOPY Right   . 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  Subjective Assessment - 01/29/19 1104    Subjective  Patient reports that she felt a little better for a day and a half but really had a bad weekend, very "painful"    Currently in Pain?  Yes    Pain Score  8     Pain Location  Hip    Pain Orientation  Right    Aggravating Factors   activity    Pain Relieving Factors  ice and elevation help some                       [facility] Adult PT Treatment/Exercise - 01/29/19 0001      Exercises   Exercises  Knee/Hip      Knee/Hip Exercises: Stretches    Passive Hamstring Stretch  Both;4 reps;20 seconds    ITB Stretch  Right;5 reps;10 seconds    Piriformis Stretch  Right;5 reps;10 seconds    Gastroc Stretch  Both;3 reps;20 seconds      Knee/Hip Exercises: Supine   Bridges with Ball Squeeze  20 reps    Other Supine Knee/Hip Exercises  feet on ball K2C, trunk rotation, small bridges and isometric abs      Cryotherapy   Number Minutes Cryotherapy  15 Minutes    Cryotherapy Location  Hip    Type of Cryotherapy  Ice pack      Electrical Stimulation   Electrical Stimulation Location  right hip     Electrical Stimulation Action  IFC    Electrical Stimulation Parameters  supine    Electrical Stimulation Goals  Pain      Iontophoresis   Type of Iontophoresis  Dexamethasone    Location  right GT area    Dose  74m    Time  4 hour patch      Manual Therapy   Manual Therapy  Soft  tissue mobilization    Manual therapy comments  PROM of the HS and ITB's    Soft tissue mobilization  with vibration to the upper traps and rhomboids, use of roller on the ITB's               PT Short Term Goals - 01/29/19 1137      PT SHORT TERM GOAL #1   Title  independent with initial HEP    Status  Partially Met        PT Long Term Goals - 01/24/19 1047      PT LONG TERM GOAL #1   Title  increase lumbar ROM 25%    Time  8    Period  Weeks    Status  New      PT LONG TERM GOAL #2   Title  decrease pain 50%    Time  8    Period  Weeks    Status  New      PT LONG TERM GOAL #3   Title  report no difficulty sleeping    Time  8    Period  Weeks    Status  New      PT LONG TERM GOAL #4   Title  resume walking    Time  8    Period  Weeks    Status  New            Plan - 01/29/19 1136    Clinical Impression Statement  Patient remains very tight and painful, she is very tender in the right GT area, she reports that all of the treatment we did today helped relieve some pain.  She is very tight and painful with any stretches  of the LE's    PT Next Visit Plan  will work on the flexibility and the pain    Consulted and Agree with Plan of Care  Patient       Patient will benefit from skilled therapeutic intervention in order to improve the following deficits and impairments:  Decreased strength, Decreased range of motion, Improper body mechanics, Pain, Postural dysfunction, Increased muscle spasms, Abnormal gait, Decreased mobility, Decreased activity tolerance, Decreased balance, Difficulty walking, Impaired flexibility  Visit Diagnosis: 1. Pain in right hip   2. Difficulty in walking, not elsewhere classified        Problem List Patient Active Problem List   Diagnosis Date Noted  . History of uterine cancer 09/19/2013  . Diverticulosis of colon without hemorrhage 09/19/2013  . Postoperative anemia due to acute blood loss 08/13/2013  . OA (osteoarthritis) of knee 08/12/2013    [name], PT 01/29/2019, 11:38 AM  [place] [address] [place], [place], [address] Phone: [phone]   Fax:  [phone]  Name: Rhonda Gregory MRN: [id number] Date of Birth: Mar 24, 1941

## 2019-01-30 ENCOUNTER — Other Ambulatory Visit: Payer: Self-pay

## 2019-01-30 ENCOUNTER — Encounter (HOSPITAL_COMMUNITY)
Admission: RE | Admit: 2019-01-30 | Discharge: 2019-01-30 | Disposition: A | Discharge status: Home / Self Care | Payer: Medicare Other | Source: Ambulatory Visit | Attending: Physician Assistant | Admitting: Physician Assistant

## 2019-01-30 DIAGNOSIS — Z96651 Presence of right artificial knee joint: Secondary | ICD-10-CM | POA: Diagnosis not present

## 2019-01-30 MED ORDER — TECHNETIUM TC 99M MEDRONATE IV KIT
20.0000 | PACK | Freq: Once | INTRAVENOUS | Status: AC | PRN
  Administered 2019-01-30: 20 via INTRAVENOUS

## 2019-02-05 ENCOUNTER — Encounter: Payer: Self-pay | Admitting: Physical Therapy

## 2019-02-05 ENCOUNTER — Ambulatory Visit: Payer: Medicare Other | Admitting: Physical Therapy

## 2019-02-05 ENCOUNTER — Other Ambulatory Visit: Payer: Self-pay

## 2019-02-05 DIAGNOSIS — R262 Difficulty in walking, not elsewhere classified: Secondary | ICD-10-CM

## 2019-02-05 DIAGNOSIS — M25551 Pain in right hip: Secondary | ICD-10-CM

## 2019-02-05 NOTE — Therapy (Signed)
[place], [place], [address] Phone: [phone]   Fax:  [phone]  Physical Therapy Treatment  Patient Details  Name: Rhonda Gregory MRN: [id number] Date of Birth: Mar 16, 1941 Referring Provider (PT): Chabon   Encounter Date: 02/05/2019  PT End of Session - 02/05/19 0835    Visit Number  3    Date for PT Re-Evaluation  03/26/19    PT Start Time  5638    PT Stop Time  1055    PT Time Calculation (min)  181 min    Activity Tolerance  Patient tolerated treatment well;Patient limited by pain    Behavior During Therapy  [facility] for tasks assessed/performed       Past Medical History:  Diagnosis Date  . Arthritis   . Colon polyps   . Diverticulosis   . Gastric ulcer   . Heartburn   . HLD (hyperlipidemia)   . Hypertension   . PONV (postoperative nausea and vomiting)   . Sleep apnea    wears CPAP  . Uterine cancer ([place]) 1995    Past Surgical History:  Procedure Laterality Date  . ABDOMINAL HYSTERECTOMY     with salpingooophorectomy  . COLONOSCOPY W/ POLYPECTOMY    . DUPUYTREN CONTRACTURE RELEASE Bilateral   . HEMORRHOID SURGERY    . KNEE ARTHROSCOPY Right   . 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  Subjective Assessment - 02/05/19 0758    Subjective  Patient had a bone scan and it was negative.  She reports that after our treatment she feels good for a day or so and then the pain comes back the next morning.    Currently in Pain?  Yes    Pain Score  8     Pain Location  Hip    Pain Orientation  Right;Lateral    Pain Descriptors / Indicators  Aching;Radiating    Pain Radiating Towards  to the right knee    Aggravating Factors   any activity                       [facility] Adult PT  Treatment/Exercise - 02/05/19 0001      Knee/Hip Exercises: Aerobic   Recumbent Bike  5 minutes      Knee/Hip Exercises: Supine   Bridges with Ball Squeeze  20 reps    Other Supine Knee/Hip Exercises  feet on ball K2C, trunk rotation, small bridges and isometric abs      Cryotherapy   Number Minutes Cryotherapy  15 Minutes    Cryotherapy Location  Hip    Type of Cryotherapy  Ice pack      Electrical Stimulation   Electrical Stimulation Location  right hip     Electrical Stimulation Action  IFC    Electrical Stimulation Parameters  supine    Electrical Stimulation Goals  Pain      Iontophoresis   Type of Iontophoresis  Dexamethasone    Location  right GT area    Dose  55mA    Time  4 hour patch      Manual Therapy   Manual Therapy  Soft tissue mobilization    Manual therapy comments  PROM of the HS and ITB's    Soft tissue  mobilization  with vibration to the upper traps and rhomboids, use of roller on the ITB's               PT Short Term Goals - 02/05/19 0836      PT SHORT TERM GOAL #1   Title  independent with initial HEP    Status  Achieved        PT Long Term Goals - 01/24/19 1047      PT LONG TERM GOAL #1   Title  increase lumbar ROM 25%    Time  8    Period  Weeks    Status  New      PT LONG TERM GOAL #2   Title  decrease pain 50%    Time  8    Period  Weeks    Status  New      PT LONG TERM GOAL #3   Title  report no difficulty sleeping    Time  8    Period  Weeks    Status  New      PT LONG TERM GOAL #4   Title  resume walking    Time  8    Period  Weeks    Status  New            Plan - 02/05/19 0947    Clinical Impression Statement  Added exercises today, did not increase the pain even though she was in a high level of pain, she is very tight in the HS, and ITB.  Very tender in the right GT area and the ITB area    PT Next Visit Plan  will work on the flexibility and the pain    Consulted and Agree with Plan of Care  Patient        Patient will benefit from skilled therapeutic intervention in order to improve the following deficits and impairments:  Decreased strength, Decreased range of motion, Improper body mechanics, Pain, Postural dysfunction, Increased muscle spasms, Abnormal gait, Decreased mobility, Decreased activity tolerance, Decreased balance, Difficulty walking, Impaired flexibility  Visit Diagnosis: 1. Pain in right hip   2. Difficulty in walking, not elsewhere classified        Problem List Patient Active Problem List   Diagnosis Date Noted  . History of uterine cancer 09/19/2013  . Diverticulosis of colon without hemorrhage 09/19/2013  . Postoperative anemia due to acute blood loss 08/13/2013  . OA (osteoarthritis) of knee 08/12/2013    [name], PT 02/05/2019, 8:37 AM  [place] [address] [facility] [place], [place], [address] Phone: [phone]   Fax:  [phone]  Name: ARDEN AXON MRN: [id number] Date of Birth: [birth year]

## 2019-02-07 ENCOUNTER — Ambulatory Visit: Payer: Medicare Other | Admitting: Physical Therapy

## 2019-02-07 ENCOUNTER — Other Ambulatory Visit: Payer: Self-pay

## 2019-02-07 ENCOUNTER — Encounter: Payer: Self-pay | Admitting: Physical Therapy

## 2019-02-07 DIAGNOSIS — M25551 Pain in right hip: Secondary | ICD-10-CM | POA: Diagnosis not present

## 2019-02-07 DIAGNOSIS — R262 Difficulty in walking, not elsewhere classified: Secondary | ICD-10-CM

## 2019-02-07 NOTE — Patient Instructions (Signed)

## 2019-02-07 NOTE — Therapy (Signed)
[place], [place], [address] Phone: [phone]   Fax:  [phone]  Physical Therapy Evaluation  Patient Details  Name: Rhonda Gregory MRN: [id number] Date of Birth: 1940/08/12 Referring Provider (PT): Chabon   Encounter Date: 02/07/2019  PT End of Session - 02/07/19 1742    Visit Number  4    Date for PT Re-Evaluation  03/26/19    PT Start Time  [id number]    PT Stop Time  1746    PT Time Calculation (min)  53 min    Activity Tolerance  Patient tolerated treatment well;Patient limited by pain    Behavior During Therapy  [facility] for tasks assessed/performed       Past Medical History:  Diagnosis Date  . Arthritis   . Colon polyps   . Diverticulosis   . Gastric ulcer   . Heartburn   . HLD (hyperlipidemia)   . Hypertension   . PONV (postoperative nausea and vomiting)   . Sleep apnea    wears CPAP  . Uterine cancer ([place]) 1995    Past Surgical History:  Procedure Laterality Date  . ABDOMINAL HYSTERECTOMY     with salpingooophorectomy  . COLONOSCOPY W/ POLYPECTOMY    . DUPUYTREN CONTRACTURE RELEASE Bilateral   . HEMORRHOID SURGERY    . KNEE ARTHROSCOPY Right   . 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   Subjective Assessment - 02/07/19 1739    Subjective  Patient is up and down with her pain, swimming she has no pain, any sitting or stnading she reports pain an 8/10, c/o pain from the right hip down the ITB to the right patella and the right lateral shin    Currently in Pain?  Yes    Pain Score  8     Pain Location  Leg    Pain Orientation  Right;Lateral                    Objective measurements completed on examination: See above findings.      [place] Adult PT Treatment/Exercise -  02/07/19 0001      Knee/Hip Exercises: Stretches   Passive Hamstring Stretch  Both;4 reps;20 seconds    ITB Stretch  Right;5 reps;10 seconds    Piriformis Stretch  Right;5 reps;10 seconds      Electrical Stimulation   Electrical Stimulation Location  right leg laterally from hip to distal ITB    Electrical Stimulation Action  IFC    Electrical Stimulation Parameters  supine    Electrical Stimulation Goals  Pain      Manual Therapy   Manual Therapy  Soft tissue mobilization    Manual therapy comments  PROM of the HS and ITB's    Soft tissue mobilization  STM to the right lateral thigh and into the right anterior tibialais       Trigger Point Dry Needling - 02/07/19 0001    Consent Given?  Yes    Education Handout Provided  Yes    Muscles Treated Lower Quadrant  Vastus lateralis    Muscles Treated Back/Hip  Gluteus medius    Vastus lateralis Response  Twitch response elicited    Gluteus Medius Response  Twitch response elicited  PT Short Term Goals - 02/05/19 0836      PT SHORT TERM GOAL #1   Title  independent with initial HEP    Status  Achieved        PT Long Term Goals - 02/07/19 1744      PT LONG TERM GOAL #1   Title  increase lumbar ROM 25%    Status  On-going      PT LONG TERM GOAL #2   Title  decrease pain 50%    Status  On-going             Plan - 02/07/19 1742    Clinical Impression Statement  Patient very tight in the HS and the ITB.  She is tender in the right ITB from the GT to the right lateral calf area.  She reports high rating of pain with any sitting or standing    PT Next Visit Plan  she is to see MD tomorrow    Consulted and Agree with Plan of Care  Patient       Patient will benefit from skilled therapeutic intervention in order to improve the following deficits and impairments:  Decreased strength, Decreased range of motion, Improper body mechanics, Pain, Postural dysfunction, Increased muscle spasms, Abnormal gait,  Decreased mobility, Decreased activity tolerance, Decreased balance, Difficulty walking, Impaired flexibility  Visit Diagnosis: Pain in right hip  Difficulty in walking, not elsewhere classified     Problem List Patient Active Problem List   Diagnosis Date Noted  . History of uterine cancer 09/19/2013  . Diverticulosis of colon without hemorrhage 09/19/2013  . Postoperative anemia due to acute blood loss 08/13/2013  . OA (osteoarthritis) of knee 08/12/2013    [name], PT 02/07/2019, 5:45 PM  [place] [address] [place], [place], [address] Phone: [phone]   Fax:  [phone]  Name: Rhonda Gregory MRN: [id number] Date of Birth: 11/14/1940

## 2019-02-08 ENCOUNTER — Encounter: Payer: Medicare Other | Admitting: Physical Therapy

## 2019-02-11 ENCOUNTER — Ambulatory Visit: Payer: Medicare Other | Admitting: Physical Therapy

## 2019-02-13 ENCOUNTER — Encounter: Payer: Self-pay | Admitting: Physical Therapy

## 2019-02-13 ENCOUNTER — Other Ambulatory Visit: Payer: Self-pay

## 2019-02-13 ENCOUNTER — Ambulatory Visit: Payer: Medicare Other | Admitting: Physical Therapy

## 2019-02-13 DIAGNOSIS — R262 Difficulty in walking, not elsewhere classified: Secondary | ICD-10-CM

## 2019-02-13 DIAGNOSIS — M25551 Pain in right hip: Secondary | ICD-10-CM

## 2019-02-13 NOTE — Therapy (Signed)
[place], [place], [address] Phone: [phone]   Fax:  [phone]  Physical Therapy Treatment  Patient Details  Name: Rhonda Gregory MRN: [id number] Date of Birth: [birth year] Referring Provider (PT): Chabon   Encounter Date: 02/13/2019  PT End of Session - 02/13/19 0840    Visit Number  5    Date for PT Re-Evaluation  03/26/19    PT Start Time  [id number]    PT Stop Time  0853    PT Time Calculation (min)  56 min    Activity Tolerance  Patient tolerated treatment well;Patient limited by pain    Behavior During Therapy  [facility] for tasks assessed/performed       Past Medical History:  Diagnosis Date  . Arthritis   . Colon polyps   . Diverticulosis   . Gastric ulcer   . Heartburn   . HLD (hyperlipidemia)   . Hypertension   . PONV (postoperative nausea and vomiting)   . Sleep apnea    wears CPAP  . Uterine cancer ([place]) 1995    Past Surgical History:  Procedure Laterality Date  . ABDOMINAL HYSTERECTOMY     with salpingooophorectomy  . COLONOSCOPY W/ POLYPECTOMY    . DUPUYTREN CONTRACTURE RELEASE Bilateral   . HEMORRHOID SURGERY    . KNEE ARTHROSCOPY Right   . 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  Subjective Assessment - 02/13/19 0832    Subjective  Patient had injection last week, reports that the pain in the hip is better but pain down the distal ITB is the same    Currently in Pain?  Yes    Pain Score  8     Pain Location  Hip    Pain Orientation  Right;Lateral;Distal    Aggravating Factors   walking                       [facility] Adult PT Treatment/Exercise - 02/13/19 0001      Cryotherapy   Number Minutes Cryotherapy  15 Minutes    Cryotherapy Location  Hip    Type of  Cryotherapy  Ice pack      Electrical Stimulation   Electrical Stimulation Location  right leg laterally from hip to distal ITB    Electrical Stimulation Action  IFC    Electrical Stimulation Parameters  supine    Electrical Stimulation Goals  Pain      Manual Therapy   Manual Therapy  Soft tissue mobilization    Manual therapy comments  PROM of the HS and ITB's    Soft tissue mobilization  STM to the right lateral thigh and into the right anterior tibialais               PT Short Term Goals - 02/05/19 0836      PT SHORT TERM GOAL #1   Title  independent with initial HEP    Status  Achieved        PT Long Term Goals - 02/13/19 [id number]      PT LONG TERM GOAL #1   Title  increase lumbar ROM 25%    Status  On-going      PT LONG TERM GOAL #2   Title  decrease  pain 50%    Status  On-going            Plan - 02/13/19 0841    Clinical Impression Statement  Patient had injection in the GT area last week, she reports less pain in the hip but still high rating of pain downt he right lateral leg espewcially the distal ITB area.  I really spent more time on STM to this area today due to tightness and pain, as we did this she reported it feeling better, she is walking better today but still a limp    PT Next Visit Plan  work on the tightness and the ITB    Consulted and Agree with Plan of Care  Patient       Patient will benefit from skilled therapeutic intervention in order to improve the following deficits and impairments:  Decreased strength, Decreased range of motion, Improper body mechanics, Pain, Postural dysfunction, Increased muscle spasms, Abnormal gait, Decreased mobility, Decreased activity tolerance, Decreased balance, Difficulty walking, Impaired flexibility  Visit Diagnosis: Pain in right hip  Difficulty in walking, not elsewhere classified     Problem List Patient Active Problem List   Diagnosis Date Noted  . History of uterine cancer 09/19/2013  .  Diverticulosis of colon without hemorrhage 09/19/2013  . Postoperative anemia due to acute blood loss 08/13/2013  . OA (osteoarthritis) of knee 08/12/2013    [name], PT 02/13/2019, 8:43 AM  [place] [address] [place], [place], [address] Phone: [phone]   Fax:  [phone]  Name: Rhonda Gregory MRN: [id number] Date of Birth: Oct 17, 1940

## 2019-02-14 ENCOUNTER — Encounter

## 2019-02-19 ENCOUNTER — Other Ambulatory Visit: Payer: Self-pay

## 2019-02-19 ENCOUNTER — Encounter: Payer: Self-pay | Admitting: Physical Therapy

## 2019-02-19 ENCOUNTER — Ambulatory Visit: Payer: Medicare Other | Attending: Physician Assistant | Admitting: Physical Therapy

## 2019-02-19 DIAGNOSIS — R262 Difficulty in walking, not elsewhere classified: Secondary | ICD-10-CM | POA: Insufficient documentation

## 2019-02-19 DIAGNOSIS — M25551 Pain in right hip: Secondary | ICD-10-CM | POA: Diagnosis present

## 2019-02-19 NOTE — Therapy (Signed)
[place], [place], [address] Phone: [phone]   Fax:  [phone]  Physical Therapy Treatment  Patient Details  Name: Rhonda Gregory MRN: [id number] Date of Birth: [birth year] Referring Provider (PT): Chabon   Encounter Date: 02/19/2019  PT End of Session - 02/19/19 0834    Visit Number  6    Date for PT Re-Evaluation  03/26/19    PT Start Time  0752    PT Stop Time  0853    PT Time Calculation (min)  61 min    Activity Tolerance  Patient tolerated treatment well;Patient limited by pain    Behavior During Therapy  [facility] for tasks assessed/performed       Past Medical History:  Diagnosis Date  . Arthritis   . Colon polyps   . Diverticulosis   . Gastric ulcer   . Heartburn   . HLD (hyperlipidemia)   . Hypertension   . PONV (postoperative nausea and vomiting)   . Sleep apnea    wears CPAP  . Uterine cancer ([place]) 1995    Past Surgical History:  Procedure Laterality Date  . ABDOMINAL HYSTERECTOMY     with salpingooophorectomy  . COLONOSCOPY W/ POLYPECTOMY    . DUPUYTREN CONTRACTURE RELEASE Bilateral   . HEMORRHOID SURGERY    . KNEE ARTHROSCOPY Right   . 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  Subjective Assessment - 02/19/19 0831    Subjective  Patient reports that she had a few good days but after doing a lot of shopping she had increased pain this AM, pain in the hip is better but worse at the knee area    Currently in Pain?  Yes    Pain Score  6     Pain Location  Knee    Pain Orientation  Right;Lateral    Aggravating Factors   walking and shopping                       [facility] Adult PT Treatment/Exercise - 02/19/19 0001      Cryotherapy   Number Minutes Cryotherapy  15 Minutes     Cryotherapy Location  Hip;Knee    Type of Cryotherapy  Ice pack      Electrical Stimulation   Electrical Stimulation Location  right leg laterally from hip to distal ITB    Electrical Stimulation Action  IFC    Electrical Stimulation Parameters  supine    Electrical Stimulation Goals  Pain      Iontophoresis   Type of Iontophoresis  Dexamethasone    Location  right dital ITB    Dose  62m    Time  4 hour patch      Manual Therapy   Manual Therapy  Soft tissue mobilization    Manual therapy comments  PROM of the HS and ITB's    Soft tissue mobilization  STM to the right lateral thigh and into the right anterior tibialais               PT Short Term Goals - 02/05/19 07209     PT SHORT TERM GOAL #1   Title  independent with initial HEP    Status  Achieved  PT Long Term Goals - 02/19/19 0837      PT LONG TERM GOAL #1   Title  increase lumbar ROM 25%    Status  On-going      PT LONG TERM GOAL #2   Title  decrease pain 50%    Status  On-going      PT LONG TERM GOAL #3   Title  report no difficulty sleeping    Status  Partially Met            Plan - 02/19/19 0836    Clinical Impression Statement  Patient reports less pain in the GT area, still tender, mostly pain in the right lateral knee and below the knee..  She reports a lot of shopping yesterday and reports that she did well with this.  She is very tight in the HS and resistant to stretches    PT Next Visit Plan  work on the tightness and the ITB    Consulted and Agree with Plan of Care  Patient       Patient will benefit from skilled therapeutic intervention in order to improve the following deficits and impairments:  Decreased strength, Decreased range of motion, Improper body mechanics, Pain, Postural dysfunction, Increased muscle spasms, Abnormal gait, Decreased mobility, Decreased activity tolerance, Decreased balance, Difficulty walking, Impaired flexibility  Visit Diagnosis: Pain in  right hip  Difficulty in walking, not elsewhere classified     Problem List Patient Active Problem List   Diagnosis Date Noted  . History of uterine cancer 09/19/2013  . Diverticulosis of colon without hemorrhage 09/19/2013  . Postoperative anemia due to acute blood loss 08/13/2013  . OA (osteoarthritis) of knee 08/12/2013    [name], PT 02/19/2019, 8:38 AM  [place] [address] [facility] [place], [place], [address] Phone: [phone]   Fax:  [phone]  Name: COLINE CALKIN MRN: [id number] Date of Birth: Jun 27, 1940

## 2019-02-21 ENCOUNTER — Other Ambulatory Visit: Payer: Self-pay

## 2019-02-21 ENCOUNTER — Ambulatory Visit: Payer: Medicare Other | Admitting: Physical Therapy

## 2019-02-21 ENCOUNTER — Encounter: Payer: Self-pay | Admitting: Physical Therapy

## 2019-02-21 DIAGNOSIS — M25551 Pain in right hip: Secondary | ICD-10-CM

## 2019-02-21 DIAGNOSIS — R262 Difficulty in walking, not elsewhere classified: Secondary | ICD-10-CM

## 2019-02-21 NOTE — Therapy (Signed)
[place], [place], [address] Phone: [phone]   Fax:  [phone]  Physical Therapy Treatment  Patient Details  Name: Rhonda Gregory MRN: [id number] Date of Birth: [birth year] Referring Provider (PT): Chabon   Encounter Date: 02/21/2019  PT End of Session - 02/21/19 1724    Visit Number  7    Date for PT Re-Evaluation  03/26/19    PT Start Time  7737    PT Stop Time  1745    PT Time Calculation (min)  55 min    Activity Tolerance  Patient tolerated treatment well;Patient limited by pain    Behavior During Therapy  [facility] for tasks assessed/performed       Past Medical History:  Diagnosis Date  . Arthritis   . Colon polyps   . Diverticulosis   . Gastric ulcer   . Heartburn   . HLD (hyperlipidemia)   . Hypertension   . PONV (postoperative nausea and vomiting)   . Sleep apnea    wears CPAP  . Uterine cancer ([place]) 1995    Past Surgical History:  Procedure Laterality Date  . ABDOMINAL HYSTERECTOMY     with salpingooophorectomy  . COLONOSCOPY W/ POLYPECTOMY    . DUPUYTREN CONTRACTURE RELEASE Bilateral   . HEMORRHOID SURGERY    . KNEE ARTHROSCOPY Right   . 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  Subjective Assessment - 02/21/19 1722    Subjective  Patient reports that she always feels good after PT, comes in with more stress, her husband is in the hospital, some incresae in pain but reports that she has been sleeping better and is less tender    Currently in Pain?  Yes    Pain Score  5     Pain Location  Knee    Pain Orientation  Right;Lateral    Aggravating Factors   stress                       [facility] Adult PT Treatment/Exercise - 02/21/19 0001      Cryotherapy   Number Minutes  Cryotherapy  15 Minutes    Cryotherapy Location  Hip;Knee    Type of Cryotherapy  Ice pack      Electrical Stimulation   Electrical Stimulation Location  right leg laterally from hip to distal ITB    Electrical Stimulation Action  IFC    Electrical Stimulation Parameters  supine    Electrical Stimulation Goals  Pain      Iontophoresis   Type of Iontophoresis  Dexamethasone    Location  right dital ITB    Dose  38m    Time  4 hour patch      Manual Therapy   Manual Therapy  Soft tissue mobilization    Manual therapy comments  PROM of the HS and ITB's    Soft tissue mobilization  STM to the right lateral thigh and into the right anterior tibialais               PT Short Term Goals - 02/05/19 03668     PT SHORT TERM GOAL #1   Title  independent with initial HEP    Status  Achieved  PT Long Term Goals - 02/21/19 1726      PT LONG TERM GOAL #2   Title  decrease pain 50%    Status  On-going      PT LONG TERM GOAL #3   Title  report no difficulty sleeping    Status  Partially Met            Plan - 02/21/19 1725    Clinical Impression Statement  Patient is much less tender in the GT area, the distal ITB and into the fibular head area is very tender.  She is moving better, some limping.  She is very tight and resistant to stretches of the HS and the ITB    PT Next Visit Plan  work on the tightness and the ITB    Consulted and Agree with Plan of Care  Patient       Patient will benefit from skilled therapeutic intervention in order to improve the following deficits and impairments:  Decreased strength, Decreased range of motion, Improper body mechanics, Pain, Postural dysfunction, Increased muscle spasms, Abnormal gait, Decreased mobility, Decreased activity tolerance, Decreased balance, Difficulty walking, Impaired flexibility  Visit Diagnosis: Pain in right hip  Difficulty in walking, not elsewhere classified     Problem List Patient Active  Problem List   Diagnosis Date Noted  . History of uterine cancer 09/19/2013  . Diverticulosis of colon without hemorrhage 09/19/2013  . Postoperative anemia due to acute blood loss 08/13/2013  . OA (osteoarthritis) of knee 08/12/2013    [name], PT 02/21/2019, 5:27 PM  [address] [place], [place], [address] Phone: [phone]   Fax:  [phone]  Name: Rhonda Gregory MRN: [id number] Date of Birth: [birth year]

## 2019-02-26 ENCOUNTER — Ambulatory Visit: Payer: Medicare Other | Admitting: Physical Therapy

## 2019-02-26 ENCOUNTER — Encounter: Payer: Self-pay | Admitting: Physical Therapy

## 2019-02-26 ENCOUNTER — Other Ambulatory Visit: Payer: Self-pay

## 2019-02-26 DIAGNOSIS — R262 Difficulty in walking, not elsewhere classified: Secondary | ICD-10-CM

## 2019-02-26 DIAGNOSIS — M25551 Pain in right hip: Secondary | ICD-10-CM

## 2019-02-26 NOTE — Therapy (Signed)
[place], [place], [address] Phone: [phone]   Fax:  [phone]  Physical Therapy Treatment  Patient Details  Name: Rhonda Gregory MRN: [id number] Date of Birth: July 10, 1940 Referring Provider (PT): Chabon   Encounter Date: 02/26/2019  PT End of Session - 02/26/19 0834    Visit Number  8    Date for PT Re-Evaluation  03/26/19    PT Start Time  0756    PT Stop Time  0854    PT Time Calculation (min)  58 min    Activity Tolerance  Patient tolerated treatment well;Patient limited by pain    Behavior During Therapy  [facility] for tasks assessed/performed       Past Medical History:  Diagnosis Date  . Arthritis   . Colon polyps   . Diverticulosis   . Gastric ulcer   . Heartburn   . HLD (hyperlipidemia)   . Hypertension   . PONV (postoperative nausea and vomiting)   . Sleep apnea    wears CPAP  . Uterine cancer ([place]) 1995    Past Surgical History:  Procedure Laterality Date  . ABDOMINAL HYSTERECTOMY     with salpingooophorectomy  . COLONOSCOPY W/ POLYPECTOMY    . DUPUYTREN CONTRACTURE RELEASE Bilateral   . HEMORRHOID SURGERY    . KNEE ARTHROSCOPY Right   . 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  Subjective Assessment - 02/26/19 0759    Subjective  I am alittle better, it is more tolerable, I swim everyday, worse in the morning    Currently in Pain?  Yes    Pain Score  6     Pain Location  Knee    Pain Orientation  Right;Lateral    Aggravating Factors   sitting, walking, morning                       [facility] Adult PT Treatment/Exercise - 02/26/19 0001      Cryotherapy   Number Minutes Cryotherapy  15 Minutes    Cryotherapy Location  Hip;Knee    Type of Cryotherapy  Ice pack      Electrical Stimulation   Electrical Stimulation Location  right leg laterally from hip to distal ITB    Electrical Stimulation Action  IFC    Electrical Stimulation Parameters  supine    Electrical Stimulation Goals  Pain      Iontophoresis   Type of Iontophoresis  Dexamethasone    Location  right dital ITB    Dose  44m    Time  4 hour patch      Manual Therapy   Manual Therapy  Soft tissue mobilization    Manual therapy comments  PROM of the HS and ITB's    Soft tissue mobilization  STM to the right lateral thigh and into the right anterior tibialais               PT Short Term Goals - 02/05/19 0836      PT SHORT TERM GOAL #1   Title  independent with initial HEP    Status  Achieved        PT Long Term Goals - 02/26/19 0835      PT LONG TERM GOAL #1  Title  increase lumbar ROM 25%    Status  Partially Met            Plan - 02/26/19 0834    Clinical Impression Statement  Patient reports that she is not in excruciating pain 24/7 as she was, still reports that she hurts in the morning and with walking, reports sleeping much better without pain, she is very tight and has difficulty stretching on her own    PT Next Visit Plan  work on the tightness and the ITB    Consulted and Agree with Plan of Care  Patient       Patient will benefit from skilled therapeutic intervention in order to improve the following deficits and impairments:  Decreased strength, Decreased range of motion, Improper body mechanics, Pain, Postural dysfunction, Increased muscle spasms, Abnormal gait, Decreased mobility, Decreased activity tolerance, Decreased balance, Difficulty walking, Impaired flexibility  Visit Diagnosis: Pain in right hip  Difficulty in walking, not elsewhere classified     Problem List Patient Active Problem List   Diagnosis Date Noted  . History of uterine cancer 09/19/2013  . Diverticulosis of colon without hemorrhage 09/19/2013  . Postoperative anemia  due to acute blood loss 08/13/2013  . OA (osteoarthritis) of knee 08/12/2013    [name], PT 02/26/2019, 8:36 AM  [address] [place], [place], [address] Phone: [phone]   Fax:  [phone]  Name: Rhonda Gregory MRN: [id number] Date of Birth: 07-05-1940

## 2019-02-27 ENCOUNTER — Ambulatory Visit: Payer: Medicare Other | Admitting: Physical Therapy

## 2019-02-28 ENCOUNTER — Other Ambulatory Visit: Payer: Self-pay

## 2019-02-28 ENCOUNTER — Ambulatory Visit: Payer: Medicare Other | Admitting: Physical Therapy

## 2019-02-28 ENCOUNTER — Encounter: Payer: Self-pay | Admitting: Physical Therapy

## 2019-02-28 DIAGNOSIS — M25551 Pain in right hip: Secondary | ICD-10-CM | POA: Diagnosis not present

## 2019-02-28 DIAGNOSIS — R262 Difficulty in walking, not elsewhere classified: Secondary | ICD-10-CM

## 2019-02-28 NOTE — Therapy (Signed)
[place], [place], [address] Phone: [phone]   Fax:  [phone]  Physical Therapy Treatment  Patient Details  Name: Rhonda Gregory MRN: [id number] Date of Birth: Oct 01, 1940 Referring Provider (PT): Chabon   Encounter Date: 02/28/2019  PT End of Session - 02/28/19 1143    Visit Number  9    Date for PT Re-Evaluation  03/26/19    PT Start Time  1053    PT Stop Time  1148    PT Time Calculation (min)  55 min    Activity Tolerance  Patient tolerated treatment well;Patient limited by pain    Behavior During Therapy  [facility] for tasks assessed/performed       Past Medical History:  Diagnosis Date  . Arthritis   . Colon polyps   . Diverticulosis   . Gastric ulcer   . Heartburn   . HLD (hyperlipidemia)   . Hypertension   . PONV (postoperative nausea and vomiting)   . Sleep apnea    wears CPAP  . Uterine cancer ([place]) 1995    Past Surgical History:  Procedure Laterality Date  . ABDOMINAL HYSTERECTOMY     with salpingooophorectomy  . COLONOSCOPY W/ POLYPECTOMY    . DUPUYTREN CONTRACTURE RELEASE Bilateral   . HEMORRHOID SURGERY    . KNEE ARTHROSCOPY Right   . 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  Subjective Assessment - 02/28/19 1138    Subjective  Patient reports that the right hip is feeling better with less pain, her biggest c/o is mostly pain in the right knee area and the right lateral shin and calf area    Currently in Pain?  Yes    Pain Score  6     Pain Location  Knee    Pain Orientation  Right;Distal;Lateral    Pain Descriptors / Indicators  Aching;Sore    Pain Type  Acute pain    Aggravating Factors   walking                       [facility] Adult PT Treatment/Exercise -  02/28/19 0001      Knee/Hip Exercises: Stretches   Gastroc Stretch  Both;4 reps;20 seconds      Cryotherapy   Number Minutes Cryotherapy  15 Minutes    Cryotherapy Location  Hip;Knee    Type of Cryotherapy  Ice pack      Electrical Stimulation   Electrical Stimulation Location  right knee lateral area in to the calf area    Electrical Stimulation Action  IFC    Electrical Stimulation Parameters  supine with the leg elevated    Electrical Stimulation Goals  Pain      Manual Therapy   Manual Therapy  Soft tissue mobilization    Manual therapy comments  PROM of the HS and ITB's    Soft tissue mobilization  STM to the right lateral thigh and into the right anterior tibialais and the peroneals               PT Short Term Goals - 02/05/19 0836      PT SHORT TERM GOAL #1   Title  independent with initial HEP    Status  Achieved  PT Long Term Goals - 02/28/19 1159      PT LONG TERM GOAL #1   Title  increase lumbar ROM 25%    Status  Achieved            Plan - 02/28/19 1152    Clinical Impression Statement  Patient report that the hip is feeling better, reports that she is always going and then she is hurting, the pain is more in the right peroneal area, she is very tender here.  I added increased stretches and STM to this area, again very tender and reports "that is it"    PT Next Visit Plan  see if increased work on the calf and peroneal area helped    Consulted and Agree with Plan of Care  Patient       Patient will benefit from skilled therapeutic intervention in order to improve the following deficits and impairments:  Decreased strength, Decreased range of motion, Improper body mechanics, Pain, Postural dysfunction, Increased muscle spasms, Abnormal gait, Decreased mobility, Decreased activity tolerance, Decreased balance, Difficulty walking, Impaired flexibility  Visit Diagnosis: Pain in right hip  Difficulty in walking, not elsewhere  classified     Problem List Patient Active Problem List   Diagnosis Date Noted  . History of uterine cancer 09/19/2013  . Diverticulosis of colon without hemorrhage 09/19/2013  . Postoperative anemia due to acute blood loss 08/13/2013  . OA (osteoarthritis) of knee 08/12/2013    [name], PT 02/28/2019, 12:00 PM  [place] [address] [place], [place], [address] Phone: [phone]   Fax:  [phone]  Name: Rhonda Gregory MRN: [id number] Date of Birth: [birth year]

## 2019-03-04 ENCOUNTER — Ambulatory Visit: Payer: Medicare Other | Admitting: Physical Therapy

## 2019-03-04 ENCOUNTER — Encounter: Payer: Self-pay | Admitting: Physical Therapy

## 2019-03-04 ENCOUNTER — Other Ambulatory Visit: Payer: Self-pay

## 2019-03-04 DIAGNOSIS — M25551 Pain in right hip: Secondary | ICD-10-CM | POA: Diagnosis not present

## 2019-03-04 DIAGNOSIS — R262 Difficulty in walking, not elsewhere classified: Secondary | ICD-10-CM

## 2019-03-04 NOTE — Therapy (Signed)
[facility] [address] [place], [place], [address] Phone: [phone]   Fax:  [phone] Progress Note Reporting Period 01/24/19 to 03/04/19 for the first 10 visits  See note below for Objective Data and Assessment of Progress/Goals.      Physical Therapy Treatment  Patient Details  Name: Rhonda Gregory MRN: [id number] Date of Birth: 08/03/1940 Referring Provider (PT): Chabon   Encounter Date: 03/04/2019  PT End of Session - 03/04/19 1742    Visit Number  10    Date for PT Re-Evaluation  03/26/19    PT Start Time  1652    PT Stop Time  1743    PT Time Calculation (min)  51 min    Activity Tolerance  Patient tolerated treatment well;Patient limited by pain    Behavior During Therapy  [facility] for tasks assessed/performed       Past Medical History:  Diagnosis Date  . Arthritis   . Colon polyps   . Diverticulosis   . Gastric ulcer   . Heartburn   . HLD (hyperlipidemia)   . Hypertension   . PONV (postoperative nausea and vomiting)   . Sleep apnea    wears CPAP  . Uterine cancer ([facility]) 1995    Past Surgical History:  Procedure Laterality Date  . ABDOMINAL HYSTERECTOMY     with salpingooophorectomy  . COLONOSCOPY W/ POLYPECTOMY    . DUPUYTREN CONTRACTURE RELEASE Bilateral   . HEMORRHOID SURGERY    . KNEE ARTHROSCOPY Right   . 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  Subjective Assessment - 03/04/19 1739    Subjective  Patient has the results of her MRI, there is some small tears in the gluteal mms, tendintis and tendonosis, some degenerative changes.  She will be having an injection in the near future    Currently in Pain?  Yes    Pain Score  6     Pain Location  Knee    Pain Orientation  Right    Pain Relieving Factors   treatment helps                       [facility] Adult PT Treatment/Exercise - 03/04/19 0001      Knee/Hip Exercises: Stretches   Passive Hamstring Stretch  Both;4 reps;20 seconds    ITB Stretch  Right;5 reps;20 seconds    Piriformis Stretch  Right;5 reps;10 seconds    Gastroc Stretch  Both;4 reps;20 seconds      Cryotherapy   Number Minutes Cryotherapy  12 Minutes    Cryotherapy Location  Hip;Knee    Type of Cryotherapy  Ice pack      Electrical Stimulation   Electrical Stimulation Location  right knee lateral area in to the calf area    Electrical Stimulation Action  IFC    Electrical Stimulation Parameters  supine    Electrical Stimulation Goals  Pain      Manual Therapy   Manual Therapy  Soft tissue mobilization    Manual therapy comments  PROM of the HS and ITB's    Soft tissue mobilization  STM to the right lateral thigh and into the right anterior tibialais and the peroneals                 PT Short Term Goals - 02/05/19 0836      PT SHORT TERM GOAL #1   Title  independent with initial HEP    Status  Achieved        PT Long Term Goals - 03/04/19 1744      PT LONG TERM GOAL #1   Title  increase lumbar ROM 25%    Status  Achieved      PT LONG TERM GOAL #2   Title  decrease pain 50%    Status  Partially Met      PT LONG TERM GOAL #3   Title  report no difficulty sleeping    Status  Achieved            Plan - 03/04/19 1743    Clinical Impression Statement  patient with most tenderness int he right distal ITB and the lateral calf area, she does have some tenderness in the gluteal mms, she is very tight in the ITB and does not allow much strech, she is getting much better with teh HS and the piriformis mms    PT Next Visit Plan  continue to work on the ROM    Consulted and Agree with Plan of Care  Patient       Patient will benefit from skilled therapeutic intervention in order to improve the following deficits and impairments:   Decreased strength, Decreased range of motion, Improper body mechanics, Pain, Postural dysfunction, Increased muscle spasms, Abnormal gait, Decreased mobility, Decreased activity tolerance, Decreased balance, Difficulty walking, Impaired flexibility  Visit Diagnosis: Pain in right hip  Difficulty in walking, not elsewhere classified     Problem List Patient Active Problem List   Diagnosis Date Noted  . History of uterine cancer 09/19/2013  . Diverticulosis of colon without hemorrhage 09/19/2013  . Postoperative anemia due to acute blood loss 08/13/2013  . OA (osteoarthritis) of knee 08/12/2013    ALBRIGHT,MICHAEL W., PT 03/04/2019, 5:45 PM  [facility] [address] [place], [place], [address] Phone: [phone]   Fax:  [phone]  Name: Rhonda Gregory MRN: [id number] Date of Birth: 12/16/1940   

## 2019-03-07 ENCOUNTER — Other Ambulatory Visit: Payer: Self-pay

## 2019-03-07 ENCOUNTER — Encounter: Payer: Self-pay | Admitting: Physical Therapy

## 2019-03-07 ENCOUNTER — Ambulatory Visit: Payer: Medicare Other | Admitting: Physical Therapy

## 2019-03-07 DIAGNOSIS — R262 Difficulty in walking, not elsewhere classified: Secondary | ICD-10-CM

## 2019-03-07 DIAGNOSIS — M25551 Pain in right hip: Secondary | ICD-10-CM | POA: Diagnosis not present

## 2019-03-07 NOTE — Therapy (Signed)
[place], [place], [address] Phone: [phone]   Fax:  [phone]  Physical Therapy Treatment  Patient Details  Name: Rhonda Gregory MRN: [id number] Date of Birth: Dec 22, 1940 Referring Provider (PT): Chabon   Encounter Date: 03/07/2019  PT End of Session - 03/07/19 1518    Visit Number  11    Date for PT Re-Evaluation  03/26/19    PT Start Time  3354    PT Stop Time  1529    PT Time Calculation (min)  52 min    Activity Tolerance  Patient tolerated treatment well;Patient limited by pain       Past Medical History:  Diagnosis Date  . Arthritis   . Colon polyps   . Diverticulosis   . Gastric ulcer   . Heartburn   . HLD (hyperlipidemia)   . Hypertension   . PONV (postoperative nausea and vomiting)   . Sleep apnea    wears CPAP  . Uterine cancer ([place]) 1995    Past Surgical History:  Procedure Laterality Date  . ABDOMINAL HYSTERECTOMY     with salpingooophorectomy  . COLONOSCOPY W/ POLYPECTOMY    . DUPUYTREN CONTRACTURE RELEASE Bilateral   . HEMORRHOID SURGERY    . KNEE ARTHROSCOPY Right   . 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  Subjective Assessment - 03/07/19 1438    Subjective  Patient reports that the knee is feeling much better and having less pain in the distal ITB, her c/o pain today is back in the GT area    Currently in Pain?  Yes    Pain Score  5     Pain Location  Hip    Pain Orientation  Right;Lateral    Pain Descriptors / Indicators  Aching;Sore                       [facility] Adult PT Treatment/Exercise - 03/07/19 0001      Cryotherapy   Number Minutes Cryotherapy  12 Minutes    Cryotherapy Location  Hip;Knee    Type of Cryotherapy  Ice pack      Electrical  Stimulation   Electrical Stimulation Location  right GT area    Electrical Stimulation Action  IFC    Electrical Stimulation Parameters  supine    Electrical Stimulation Goals  Pain      Manual Therapy   Manual Therapy  Soft tissue mobilization    Manual therapy comments  PROM of the HS and ITB's    Soft tissue mobilization  STM to the right lateral thigh and into the right anterior tibialais and the peroneals       Trigger Point Dry Needling - 03/07/19 0001    Consent Given?  Yes    Muscles Treated Lower Quadrant  Vastus lateralis    Muscles Treated Back/Hip  Gluteus medius    Vastus lateralis Response  Twitch response elicited    Gluteus Medius Response  Twitch response elicited             PT Short Term Goals - 02/05/19 0836      PT SHORT TERM GOAL #1   Title  independent with initial HEP    Status  Achieved  PT Long Term Goals - 03/07/19 1528      PT LONG TERM GOAL #2   Title  decrease pain 50%    Status  Partially Met      PT LONG TERM GOAL #4   Title  resume walking    Status  On-going            Plan - 03/07/19 1519    Clinical Impression Statement  HS and ITB remain very tight, she is sore and tender in the right GT area, much less tender around the knee and the lateral calf.  She is reporting that she is remaining very active and we talekd about trying to be less active to see if this helps.    PT Next Visit Plan  I will continue to treat her pain, she is type A and is having a hard time resting, she is doing 4 exercise classes a week and swimming 4 x /week, she does say this does not hurt her when doing.    Consulted and Agree with Plan of Care  Patient       Patient will benefit from skilled therapeutic intervention in order to improve the following deficits and impairments:  Decreased strength, Decreased range of motion, Improper body mechanics, Pain, Postural dysfunction, Increased muscle spasms, Abnormal gait, Decreased mobility,  Decreased activity tolerance, Decreased balance, Difficulty walking, Impaired flexibility  Visit Diagnosis: Pain in right hip  Difficulty in walking, not elsewhere classified     Problem List Patient Active Problem List   Diagnosis Date Noted  . History of uterine cancer 09/19/2013  . Diverticulosis of colon without hemorrhage 09/19/2013  . Postoperative anemia due to acute blood loss 08/13/2013  . OA (osteoarthritis) of knee 08/12/2013    [name], PT 03/07/2019, 3:41 PM  [address] [place], [place], [address] Phone: [phone]   Fax:  [phone]  Name: Rhonda Gregory MRN: [id number] Date of Birth: 1941-06-16

## 2019-03-12 ENCOUNTER — Ambulatory Visit: Payer: Medicare Other | Admitting: Physical Therapy

## 2019-03-12 ENCOUNTER — Other Ambulatory Visit: Payer: Self-pay

## 2019-03-12 ENCOUNTER — Encounter: Payer: Self-pay | Admitting: Physical Therapy

## 2019-03-12 DIAGNOSIS — M25551 Pain in right hip: Secondary | ICD-10-CM | POA: Diagnosis not present

## 2019-03-12 DIAGNOSIS — R262 Difficulty in walking, not elsewhere classified: Secondary | ICD-10-CM

## 2019-03-12 NOTE — Therapy (Signed)
[place], [place], [address] Phone: [phone]   Fax:  [phone]  Physical Therapy Treatment  Patient Details  Name: Rhonda Gregory MRN: [id number] Date of Birth: [birth year] Referring Provider (PT): Chabon   Encounter Date: 03/12/2019  PT End of Session - 03/12/19 0828    Visit Number  12    Date for PT Re-Evaluation  03/26/19    PT Start Time  [id number]    PT Stop Time  0843    PT Time Calculation (min)  50 min    Activity Tolerance  Patient tolerated treatment well;Patient limited by pain       Past Medical History:  Diagnosis Date  . Arthritis   . Colon polyps   . Diverticulosis   . Gastric ulcer   . Heartburn   . HLD (hyperlipidemia)   . Hypertension   . PONV (postoperative nausea and vomiting)   . Sleep apnea    wears CPAP  . Uterine cancer ([place]) 1995    Past Surgical History:  Procedure Laterality Date  . ABDOMINAL HYSTERECTOMY     with salpingooophorectomy  . COLONOSCOPY W/ POLYPECTOMY    . DUPUYTREN CONTRACTURE RELEASE Bilateral   . HEMORRHOID SURGERY    . KNEE ARTHROSCOPY Right   . 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  Subjective Assessment - 03/12/19 0756    Subjective  Pain when I get up and put my feet on the ground I have pain.  Will have injection next week    Currently in Pain?  Yes    Pain Score  7     Pain Location  Hip    Pain Orientation  Right;Lateral    Aggravating Factors   walking and weight bearing                       [facility] Adult PT Treatment/Exercise - 03/12/19 0001      Knee/Hip Exercises: Stretches   Passive Hamstring Stretch  Both;4 reps;20 seconds    ITB Stretch  Right;5 reps;20 seconds    Piriformis Stretch  Right;5 reps;10 seconds    Gastroc Stretch  Both;4 reps;20 seconds      Cryotherapy   Number Minutes Cryotherapy  12 Minutes    Cryotherapy Location  Hip;Knee    Type of Cryotherapy  Ice pack      Electrical Stimulation   Electrical Stimulation Location  right ITB more distally    Electrical Stimulation Action  IFC    Electrical Stimulation Parameters  supine    Electrical Stimulation Goals  Pain      Manual Therapy   Manual Therapy  Soft tissue mobilization    Manual therapy comments  PROM of the HS and ITB's    Soft tissue mobilization  STM to the right lateral thigh and into the right anterior tibialais and the peroneals               PT Short Term Goals - 02/05/19 0836      PT SHORT TERM GOAL #1   Title  independent with initial HEP    Status  Achieved        PT Long Term Goals - 03/12/19 [id number]      PT LONG  TERM GOAL #1   Title  increase lumbar ROM 25%    Status  Achieved      PT LONG TERM GOAL #4   Title  resume walking    Status  On-going            Plan - 03/12/19 0830    Clinical Impression Statement  There is less pain in the GT and kneee area, she has pain and tenderness in the distal ITB, she remains very tight and resistant to stretch, does not allow more than 20 seconds of stretch at a time, she will plan on an injection next week    PT Next Visit Plan  Will continue to work on the tightness and pain    Consulted and Agree with Plan of Care  Patient       Patient will benefit from skilled therapeutic intervention in order to improve the following deficits and impairments:  Decreased strength, Decreased range of motion, Improper body mechanics, Pain, Postural dysfunction, Increased muscle spasms, Abnormal gait, Decreased mobility, Decreased activity tolerance, Decreased balance, Difficulty walking, Impaired flexibility  Visit Diagnosis: Pain in right hip  Difficulty in walking, not elsewhere classified     Problem List Patient Active Problem List   Diagnosis  Date Noted  . History of uterine cancer 09/19/2013  . Diverticulosis of colon without hemorrhage 09/19/2013  . Postoperative anemia due to acute blood loss 08/13/2013  . OA (osteoarthritis) of knee 08/12/2013    [name], PT 03/12/2019, 8:33 AM  [facility] [id number] [facility] [place], [place], [address] Phone: [phone]   Fax:  [phone]  Name: Rhonda Gregory MRN: [id number] Date of Birth: [birth year]

## 2019-03-14 ENCOUNTER — Other Ambulatory Visit: Payer: Self-pay

## 2019-03-14 ENCOUNTER — Encounter: Payer: Self-pay | Admitting: Physical Therapy

## 2019-03-14 ENCOUNTER — Encounter: Payer: Medicare Other | Admitting: Physical Therapy

## 2019-03-14 ENCOUNTER — Ambulatory Visit: Payer: Medicare Other | Admitting: Physical Therapy

## 2019-03-14 DIAGNOSIS — M25551 Pain in right hip: Secondary | ICD-10-CM | POA: Diagnosis not present

## 2019-03-14 DIAGNOSIS — R262 Difficulty in walking, not elsewhere classified: Secondary | ICD-10-CM

## 2019-03-14 NOTE — Therapy (Signed)
[place], [place], [address] Phone: [phone]   Fax:  [phone]  Physical Therapy Treatment  Patient Details  Name: Rhonda Gregory MRN: [id number] Date of Birth: 08/01/1940 Referring Provider (PT): Chabon   Encounter Date: 03/14/2019  PT End of Session - 03/14/19 1515    Visit Number  13    Date for PT Re-Evaluation  03/26/19    PT Start Time  1444    PT Stop Time  1530    PT Time Calculation (min)  46 min    Activity Tolerance  Patient tolerated treatment well;Patient limited by pain    Behavior During Therapy  [facility] for tasks assessed/performed       Past Medical History:  Diagnosis Date  . Arthritis   . Colon polyps   . Diverticulosis   . Gastric ulcer   . Heartburn   . HLD (hyperlipidemia)   . Hypertension   . PONV (postoperative nausea and vomiting)   . Sleep apnea    wears CPAP  . Uterine cancer ([place]) 1995    Past Surgical History:  Procedure Laterality Date  . ABDOMINAL HYSTERECTOMY     with salpingooophorectomy  . COLONOSCOPY W/ POLYPECTOMY    . DUPUYTREN CONTRACTURE RELEASE Bilateral   . HEMORRHOID SURGERY    . KNEE ARTHROSCOPY Right   . 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  Subjective Assessment - 03/14/19 1514    Subjective  Patient reports that she is a little better, still pain with weight bearing the pain is now in the mid lateral thigh    Currently in Pain?  Yes    Pain Score  6     Pain Location  Hip    Pain Orientation  Right;Lateral                       [facility] Adult PT Treatment/Exercise - 03/14/19 0001      Cryotherapy   Number Minutes Cryotherapy  12 Minutes    Cryotherapy Location  Hip;Knee    Type of Cryotherapy  Ice pack      Electrical  Stimulation   Electrical Stimulation Location  right hip and mid latral thigh    Electrical Stimulation Action  IFC    Electrical Stimulation Parameters  supine    Electrical Stimulation Goals  Pain      Manual Therapy   Manual Therapy  Soft tissue mobilization    Manual therapy comments  PROM of the HS and ITB's    Soft tissue mobilization  STM to the right lateral thigh and into the right anterior tibialais and the peroneals               PT Short Term Goals - 02/05/19 0836      PT SHORT TERM GOAL #1   Title  independent with initial HEP    Status  Achieved        PT Long Term Goals - 03/14/19 1517      PT LONG TERM GOAL #1   Title  increase lumbar ROM 25%    Status  Achieved      PT LONG TERM GOAL #2   Title  decrease pain 50%    Status  Partially  Met      PT LONG TERM GOAL #3   Title  report no difficulty sleeping    Status  Achieved      PT LONG TERM GOAL #4   Title  resume walking    Status  On-going            Plan - 03/14/19 1515    Clinical Impression Statement  Pain and tenderness today more in the mid lateral thigh, less tender in the GT area and the knee area.  She tolerated the HS and piriformis stretches well, still very painful and gaurded with the ITB stretches    PT Next Visit Plan  will have an injection next week    Consulted and Agree with Plan of Care  Patient       Patient will benefit from skilled therapeutic intervention in order to improve the following deficits and impairments:  Decreased strength, Decreased range of motion, Improper body mechanics, Pain, Postural dysfunction, Increased muscle spasms, Abnormal gait, Decreased mobility, Decreased activity tolerance, Decreased balance, Difficulty walking, Impaired flexibility  Visit Diagnosis: Pain in right hip  Difficulty in walking, not elsewhere classified     Problem List Patient Active Problem List   Diagnosis Date Noted  . History of uterine cancer 09/19/2013  .  Diverticulosis of colon without hemorrhage 09/19/2013  . Postoperative anemia due to acute blood loss 08/13/2013  . OA (osteoarthritis) of knee 08/12/2013    [name], PT 03/14/2019, 3:17 PM  [place] [address] [place], [place], [address] Phone: [phone]   Fax:  [phone]  Name: Rhonda Gregory MRN: [id number] Date of Birth: Jan 12, 1941

## 2019-03-18 ENCOUNTER — Ambulatory Visit: Payer: Medicare Other | Admitting: Physical Therapy

## 2019-03-18 ENCOUNTER — Other Ambulatory Visit: Payer: Self-pay

## 2019-03-18 ENCOUNTER — Encounter: Payer: Self-pay | Admitting: Physical Therapy

## 2019-03-18 DIAGNOSIS — R262 Difficulty in walking, not elsewhere classified: Secondary | ICD-10-CM

## 2019-03-18 DIAGNOSIS — M25551 Pain in right hip: Secondary | ICD-10-CM

## 2019-03-18 NOTE — Therapy (Signed)
[facility] [address] [place], [place], [address] Phone: [phone]   Fax:  [phone]  Physical Therapy Treatment  Patient Details  Name: Rhonda Gregory MRN: [id number] Date of Birth: 02/17/1941 Referring Provider (PT): Chabon   Encounter Date: 03/18/2019  PT End of Session - 03/18/19 1102    Visit Number  14    Date for PT Re-Evaluation  03/26/19    PT Start Time  1010    PT Stop Time  1102    PT Time Calculation (min)  52 min    Activity Tolerance  Patient tolerated treatment well;Patient limited by pain    Behavior During Therapy  [facility] for tasks assessed/performed       Past Medical History:  Diagnosis Date  . Arthritis   . Colon polyps   . Diverticulosis   . Gastric ulcer   . Heartburn   . HLD (hyperlipidemia)   . Hypertension   . PONV (postoperative nausea and vomiting)   . Sleep apnea    wears CPAP  . Uterine cancer ([facility]) 1995    Past Surgical History:  Procedure Laterality Date  . ABDOMINAL HYSTERECTOMY     with salpingooophorectomy  . COLONOSCOPY W/ POLYPECTOMY    . DUPUYTREN CONTRACTURE RELEASE Bilateral   . HEMORRHOID SURGERY    . KNEE ARTHROSCOPY Right   . 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  Subjective Assessment - 03/18/19 1100    Subjective  Patient reports that she was very active this AM, reports that she is sore with walking and after doing all of her stuff, I asked her if she thought she was doing too much and she said no.    Currently in Pain?  Yes    Pain Score  5     Pain Location  Hip    Pain Orientation  Right;Lateral    Pain Descriptors / Indicators  Aching;Sore    Aggravating Factors   walking    Pain Relieving Factors  water aerobics                       [facility]  Adult PT Treatment/Exercise - 03/18/19 0001      Cryotherapy   Number Minutes Cryotherapy  12 Minutes    Cryotherapy Location  Hip;Knee    Type of Cryotherapy  Ice pack      Electrical Stimulation   Electrical Stimulation Location  right hip and mid latral thigh    Electrical Stimulation Action  IFC    Electrical Stimulation Parameters  supine    Electrical Stimulation Goals  Pain      Manual Therapy   Manual Therapy  Soft tissue mobilization    Manual therapy comments  PROM of the HS and ITB's    Soft tissue mobilization  STM to the right lateral thigh and into the right anterior tibialais and the peroneals               PT Short Term Goals - 02/05/19 0836      PT SHORT TERM GOAL #1   Title  independent with initial HEP    Status  Achieved        PT Long Term Goals - 03/18/19 1149        PT LONG TERM GOAL #2   Title  decrease pain 50%    Status  Partially Met      PT LONG TERM GOAL #3   Title  report no difficulty sleeping    Status  Achieved            Plan - 03/18/19 1102    Clinical Impression Statement  Patient reports that she is doing better overall and not hurting 24/7 but really hurts with walking, sleeping better. She is very tight in the ITB, HS and the quad, this is very painful when stretching.    PT Next Visit Plan  having an injection on Wednesday    Consulted and Agree with Plan of Care  Patient       Patient will benefit from skilled therapeutic intervention in order to improve the following deficits and impairments:  Decreased strength, Decreased range of motion, Improper body mechanics, Pain, Postural dysfunction, Increased muscle spasms, Abnormal gait, Decreased mobility, Decreased activity tolerance, Decreased balance, Difficulty walking, Impaired flexibility  Visit Diagnosis: Pain in right hip  Difficulty in walking, not elsewhere classified     Problem List Patient Active Problem List   Diagnosis Date Noted  . History of  uterine cancer 09/19/2013  . Diverticulosis of colon without hemorrhage 09/19/2013  . Postoperative anemia due to acute blood loss 08/13/2013  . OA (osteoarthritis) of knee 08/12/2013    [name], PT 03/18/2019, 11:50 AM  [place] [address] [place], [place], [address] Phone: [phone]   Fax:  [phone]  Name: Rhonda Gregory MRN: [id number] Date of Birth: 26-Sep-1940

## 2019-03-20 ENCOUNTER — Encounter: Payer: Medicare Other | Admitting: Physical Therapy

## 2019-03-26 ENCOUNTER — Encounter: Payer: Self-pay | Admitting: Physical Therapy

## 2019-03-26 ENCOUNTER — Other Ambulatory Visit: Payer: Self-pay

## 2019-03-26 ENCOUNTER — Ambulatory Visit: Payer: Medicare Other | Attending: Physician Assistant | Admitting: Physical Therapy

## 2019-03-26 ENCOUNTER — Ambulatory Visit: Payer: Medicare Other | Admitting: Physical Therapy

## 2019-03-26 DIAGNOSIS — R262 Difficulty in walking, not elsewhere classified: Secondary | ICD-10-CM

## 2019-03-26 DIAGNOSIS — M25551 Pain in right hip: Secondary | ICD-10-CM | POA: Diagnosis present

## 2019-03-26 NOTE — Therapy (Signed)
[place] [address] [place], [place], [address] Phone: [phone]   Fax:  [phone]  Physical Therapy Treatment  Patient Details  Name: Rhonda Gregory MRN: [id number] Date of Birth: 03-21-1941 Referring Provider (PT): Chabon   Encounter Date: 03/26/2019  PT End of Session - 03/26/19 1008    Visit Number  15    Date for PT Re-Evaluation  03/26/19    PT Start Time  0927    PT Stop Time  1020    PT Time Calculation (min)  53 min    Activity Tolerance  Patient tolerated treatment well;Patient limited by pain    Behavior During Therapy  [facility] for tasks assessed/performed       Past Medical History:  Diagnosis Date  . Arthritis   . Colon polyps   . Diverticulosis   . Gastric ulcer   . Heartburn   . HLD (hyperlipidemia)   . Hypertension   . PONV (postoperative nausea and vomiting)   . Sleep apnea    wears CPAP  . Uterine cancer ([place]) 1995    Past Surgical History:  Procedure Laterality Date  . ABDOMINAL HYSTERECTOMY     with salpingooophorectomy  . COLONOSCOPY W/ POLYPECTOMY    . DUPUYTREN CONTRACTURE RELEASE Bilateral   . HEMORRHOID SURGERY    . KNEE ARTHROSCOPY Right   . 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  Subjective Assessment - 03/26/19 1006    Subjective  Patient had injection last week, and reports no changes.  I again question her about doing too much    Currently in Pain?  Yes    Pain Score  6     Pain Location  Hip    Pain Orientation  Right;Lateral    Pain Descriptors / Indicators  Sore    Aggravating Factors   walking    Pain Relieving Factors  ice                       [facility] Adult PT Treatment/Exercise - 03/26/19 0001      Cryotherapy   Number Minutes Cryotherapy  12 Minutes     Cryotherapy Location  Hip;Knee    Type of Cryotherapy  Ice pack      Electrical Stimulation   Electrical Stimulation Location  right hip and mid latral thigh    Electrical Stimulation Action  IFC    Electrical Stimulation Parameters  supine    Electrical Stimulation Goals  Pain      Manual Therapy   Manual Therapy  Soft tissue mobilization    Manual therapy comments  PROM of the HS and ITB's    Soft tissue mobilization  STM to the right TFL and ITB, performed some manual traction of the hip joint and the leg, PROM of K2C and some abduction today       Trigger Point Dry Needling - 03/26/19 0001    Consent Given?  Yes    Muscles Treated Lower Quadrant  Vastus lateralis    Vastus lateralis Response  Twitch response elicited             PT Short Term Goals - 02/05/19 0836      PT SHORT TERM GOAL #1   Title  independent with  initial HEP    Status  Achieved        PT Long Term Goals - 03/26/19 1010      PT LONG TERM GOAL #1   Title  increase lumbar ROM 25%    Status  Achieved      PT LONG TERM GOAL #2   Title  decrease pain 50%    Status  Partially Met            Plan - 03/26/19 1009    Clinical Impression Statement  Patient with very good LTR's inthe right TFL and vastus lateralis area, she is very tender here, she was defintely tighter today than the last few weeks, resisted to stretch    PT Next Visit Plan  see if today helped    Consulted and Agree with Plan of Care  Patient       Patient will benefit from skilled therapeutic intervention in order to improve the following deficits and impairments:  Decreased strength, Decreased range of motion, Improper body mechanics, Pain, Postural dysfunction, Increased muscle spasms, Abnormal gait, Decreased mobility, Decreased activity tolerance, Decreased balance, Difficulty walking, Impaired flexibility  Visit Diagnosis: Pain in right hip  Difficulty in walking, not elsewhere classified     Problem  List Patient Active Problem List   Diagnosis Date Noted  . History of uterine cancer 09/19/2013  . Diverticulosis of colon without hemorrhage 09/19/2013  . Postoperative anemia due to acute blood loss 08/13/2013  . OA (osteoarthritis) of knee 08/12/2013    [name], PT 03/26/2019, 10:12 AM  [place] [address] [place], [place], [address] Phone: [phone]   Fax:  [phone]  Name: Rhonda Gregory MRN: [id number] Date of Birth: 18-Apr-1941

## 2019-03-28 ENCOUNTER — Encounter: Payer: Self-pay | Admitting: Physical Therapy

## 2019-03-28 ENCOUNTER — Ambulatory Visit: Payer: Medicare Other | Admitting: Physical Therapy

## 2019-03-28 ENCOUNTER — Other Ambulatory Visit: Payer: Self-pay

## 2019-03-28 DIAGNOSIS — R262 Difficulty in walking, not elsewhere classified: Secondary | ICD-10-CM

## 2019-03-28 DIAGNOSIS — M25551 Pain in right hip: Secondary | ICD-10-CM

## 2019-03-28 NOTE — Therapy (Signed)
[place], [place], [address] Phone: [phone]   Fax:  [phone]  Physical Therapy Treatment  Patient Details  Name: SIANNI CLONINGER MRN: [id number] Date of Birth: 1941/05/05 Referring Provider (PT): Chabon   Encounter Date: 03/28/2019  PT End of Session - 03/28/19 1001    Visit Number  16    Date for PT Re-Evaluation  04/26/19    PT Start Time  0928    PT Stop Time  1015    PT Time Calculation (min)  47 min    Activity Tolerance  Patient tolerated treatment well    Behavior During Therapy  [facility] for tasks assessed/performed       Past Medical History:  Diagnosis Date  . Arthritis   . Colon polyps   . Diverticulosis   . Gastric ulcer   . Heartburn   . HLD (hyperlipidemia)   . Hypertension   . PONV (postoperative nausea and vomiting)   . Sleep apnea    wears CPAP  . Uterine cancer ([place]) 1995    Past Surgical History:  Procedure Laterality Date  . ABDOMINAL HYSTERECTOMY     with salpingooophorectomy  . COLONOSCOPY W/ POLYPECTOMY    . DUPUYTREN CONTRACTURE RELEASE Bilateral   . HEMORRHOID SURGERY    . KNEE ARTHROSCOPY Right   . 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  Subjective Assessment - 03/28/19 0959    Subjective  Patient reports that she was sore after the last treatment but is feeling better today and yesterday, reports that she painted her deck yesterday    Currently in Pain?  Yes    Pain Score  5     Pain Location  Hip    Pain Orientation  Right;Lateral    Pain Relieving Factors  the treatment I think helps                       [facility] Adult PT Treatment/Exercise - 03/28/19 0001      Knee/Hip Exercises: Stretches   Gastroc Stretch  Both;4 reps;20 seconds      Cryotherapy   Number Minutes Cryotherapy  12 Minutes    Cryotherapy Location  Hip;Knee    Type of Cryotherapy  Ice pack      Electrical Stimulation   Electrical Stimulation Location  right hip and mid latral thigh    Electrical Stimulation Action  IFC    Electrical Stimulation Parameters  supine    Electrical Stimulation Goals  Pain      Manual Therapy   Manual Therapy  Soft tissue mobilization    Manual therapy comments  PROM of the HS and ITB's    Soft tissue mobilization  STM to the right TFL and ITB, performed some manual traction of the hip joint and the leg, PROM of K2C and some abduction today       Trigger Point Dry Needling - 03/28/19 0001    Consent Given?  Yes    Muscles Treated Lower Quadrant  Vastus lateralis    Vastus lateralis Response  Twitch response elicited             PT Short Term Goals - 02/05/19 0836      PT SHORT TERM GOAL #1  Title  independent with initial HEP    Status  Achieved        PT Long Term Goals - 03/28/19 1003      PT LONG TERM GOAL #1   Title  increase lumbar ROM 25%    Status  Achieved      PT LONG TERM GOAL #2   Title  decrease pain 50%    Status  Partially Met      PT LONG TERM GOAL #3   Title  report no difficulty sleeping    Status  Achieved            Plan - 03/28/19 1001    Clinical Impression Statement  Patient walking with less limp and ;less trunk lean, she did not resist and c/o pain as much with stretcheing today.  She will be on vacation next week    PT Next Visit Plan  asked to assure that she continue to stretch and do well with stretching while she is away next week    Consulted and Agree with Plan of Care  Patient       Patient will benefit from skilled therapeutic intervention in order to improve the following deficits and impairments:  Decreased strength, Decreased range of motion, Improper body mechanics, Pain, Postural dysfunction, Increased muscle spasms, Abnormal gait, Decreased mobility,  Decreased activity tolerance, Decreased balance, Difficulty walking, Impaired flexibility  Visit Diagnosis: Pain in right hip - Plan: PT plan of care cert/re-cert  Difficulty in walking, not elsewhere classified - Plan: PT plan of care cert/re-cert     Problem List Patient Active Problem List   Diagnosis Date Noted  . History of uterine cancer 09/19/2013  . Diverticulosis of colon without hemorrhage 09/19/2013  . Postoperative anemia due to acute blood loss 08/13/2013  . OA (osteoarthritis) of knee 08/12/2013    [name], PT 03/28/2019, 10:05 AM  [place] [address] [place], [place], [address] Phone: [phone]   Fax:  [phone]  Name: WALAA CAREL MRN: [id number] Date of Birth: 1941/02/17

## 2019-04-09 ENCOUNTER — Ambulatory Visit: Payer: Medicare Other | Admitting: Physical Therapy

## 2019-04-09 ENCOUNTER — Encounter: Payer: Self-pay | Admitting: Physical Therapy

## 2019-04-09 ENCOUNTER — Other Ambulatory Visit: Payer: Self-pay

## 2019-04-09 DIAGNOSIS — M25551 Pain in right hip: Secondary | ICD-10-CM | POA: Diagnosis not present

## 2019-04-09 DIAGNOSIS — R262 Difficulty in walking, not elsewhere classified: Secondary | ICD-10-CM

## 2019-04-09 NOTE — Therapy (Signed)
[name], [place], [address] Phone: [phone]   Fax:  [phone]  Physical Therapy Treatment  Patient Details  Name: Rhonda Gregory MRN: [id number] Date of Birth: September 12, 1940 Referring Provider (PT): Chabon   Encounter Date: 04/09/2019  PT End of Session - 04/09/19 1012    Visit Number  17    Date for PT Re-Evaluation  04/26/19    PT Start Time  0926    PT Stop Time  1022    PT Time Calculation (min)  56 min    Activity Tolerance  Patient tolerated treatment well    Behavior During Therapy  [facility] for tasks assessed/performed       Past Medical History:  Diagnosis Date  . Arthritis   . Colon polyps   . Diverticulosis   . Gastric ulcer   . Heartburn   . HLD (hyperlipidemia)   . Hypertension   . PONV (postoperative nausea and vomiting)   . Sleep apnea    wears CPAP  . Uterine cancer ([place]) 1995    Past Surgical History:  Procedure Laterality Date  . ABDOMINAL HYSTERECTOMY     with salpingooophorectomy  . COLONOSCOPY W/ POLYPECTOMY    . DUPUYTREN CONTRACTURE RELEASE Bilateral   . HEMORRHOID SURGERY    . KNEE ARTHROSCOPY Right   . 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  Subjective Assessment - 04/09/19 1008    Subjective  Patient was last seen 12 days ago, she has been at the beach, she returns feporting worse pain, subjectively her pain is only with walking and weight bearing, she does report difficulty sleeping.  She reports that she did her exercises and swam during vacation    Currently in Pain?  Yes    Pain Score  7     Pain Location  Leg    Pain Orientation  Right;Upper;Lateral    Pain Descriptors / Indicators  Aching;Constant    Aggravating Factors   any weight bearing                        [facility] Adult PT Treatment/Exercise - 04/09/19 0001      Knee/Hip Exercises: Stretches   Passive Hamstring Stretch  Both;4 reps;20 seconds    Quad Stretch  Right;3 reps;10 seconds    ITB Stretch  Right;5 reps;20 seconds    Piriformis Stretch  Right;5 reps;10 seconds      Moist Heat Therapy   Number Minutes Moist Heat  15 Minutes    Moist Heat Location  Hip      Electrical Stimulation   Electrical Stimulation Location  right hip and mid latral thigh    Electrical Stimulation Action  IFC    Electrical Stimulation Parameters  supine    Electrical Stimulation Goals  Pain      Manual Therapy   Manual Therapy  Soft tissue mobilization    Manual therapy comments  PROM of the HS and ITB's    Soft tissue mobilization  STM to the right TFL and ITB, performed some manual traction of the hip joint and the leg, PROM of K2C and some abduction today       Trigger Point Dry Needling - 04/09/19 0001    Consent Given?  Yes    Muscles Treated Lower Quadrant  Vastus lateralis    Vastus lateralis Response  Twitch response elicited             PT Short Term Goals - 02/05/19 0836      PT SHORT TERM GOAL #1   Title  independent with initial HEP    Status  Achieved        PT Long Term Goals - 04/09/19 1014      PT LONG TERM GOAL #1   Title  increase lumbar ROM 25%    Status  Achieved      PT LONG TERM GOAL #2   Title  decrease pain 50%    Status  Partially Met      PT LONG TERM GOAL #3   Title  report no difficulty sleeping    Status  Achieved      PT LONG TERM GOAL #4   Title  resume walking    Status  On-going            Plan - 04/09/19 1013    Clinical Impression Statement  Patient will se the MD Friday, She is is frustrated with her pain levels, she has pain with walking.  The pain has moved from the GT area, the buttock, the knee and now the lateral right thigh.  She has tightness and tenderness, she does not tolerate stretching  due to tightness much more than 10 seconds    PT Next Visit Plan  see if changing to heat helps    Consulted and Agree with Plan of Care  Patient       Patient will benefit from skilled therapeutic intervention in order to improve the following deficits and impairments:  Decreased strength, Decreased range of motion, Improper body mechanics, Pain, Postural dysfunction, Increased muscle spasms, Abnormal gait, Decreased mobility, Decreased activity tolerance, Decreased balance, Difficulty walking, Impaired flexibility  Visit Diagnosis: Pain in right hip  Difficulty in walking, not elsewhere classified     Problem List Patient Active Problem List   Diagnosis Date Noted  . History of uterine cancer 09/19/2013  . Diverticulosis of colon without hemorrhage 09/19/2013  . Postoperative anemia due to acute blood loss 08/13/2013  . OA (osteoarthritis) of knee 08/12/2013    [name], PT 04/09/2019, 10:15 AM  [place] [address] [place], [place], [address] Phone: [phone]   Fax:  [phone]  Name: Rhonda Gregory MRN: [id number] Date of Birth: July 19, 1940

## 2019-04-11 ENCOUNTER — Ambulatory Visit: Payer: Medicare Other | Admitting: Physical Therapy

## 2019-04-11 ENCOUNTER — Encounter: Payer: Self-pay | Admitting: Physical Therapy

## 2019-04-11 ENCOUNTER — Other Ambulatory Visit: Payer: Self-pay

## 2019-04-11 DIAGNOSIS — M25551 Pain in right hip: Secondary | ICD-10-CM

## 2019-04-11 DIAGNOSIS — R262 Difficulty in walking, not elsewhere classified: Secondary | ICD-10-CM

## 2019-04-11 NOTE — Therapy (Signed)
[place], [place], [address] Phone: [phone]   Fax:  [phone]  Physical Therapy Treatment  Patient Details  Name: Rhonda Gregory MRN: [id number] Date of Birth: April 23, 1941 Referring Provider (PT): Chabon   Encounter Date: 04/11/2019  PT End of Session - 04/11/19 1001    Visit Number  18    Date for PT Re-Evaluation  04/26/19    PT Start Time  0929    PT Stop Time  1020    PT Time Calculation (min)  51 min    Behavior During Therapy  [facility] for tasks assessed/performed       Past Medical History:  Diagnosis Date  . Arthritis   . Colon polyps   . Diverticulosis   . Gastric ulcer   . Heartburn   . HLD (hyperlipidemia)   . Hypertension   . PONV (postoperative nausea and vomiting)   . Sleep apnea    wears CPAP  . Uterine cancer ([place]) 1995    Past Surgical History:  Procedure Laterality Date  . ABDOMINAL HYSTERECTOMY     with salpingooophorectomy  . COLONOSCOPY W/ POLYPECTOMY    . DUPUYTREN CONTRACTURE RELEASE Bilateral   . HEMORRHOID SURGERY    . KNEE ARTHROSCOPY Right   . 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  Subjective Assessment - 04/11/19 0958    Subjective  Patient has had ups and downs with her pain, she is better than when we first started, pain has been from the hip GT area and anterior, to and through the ITB and the lateral knee and into the peroneal area, this area has been very tender    Currently in Pain?  Yes    Pain Score  7     Pain Location  Leg    Pain Orientation  Right;Lateral;Upper    Aggravating Factors   any walking and weight bearing increases pain                       [facility] Adult PT Treatment/Exercise - 04/11/19 0001      Moist Heat Therapy   Number Minutes Moist Heat  15 Minutes    Moist Heat Location  Hip      Electrical Stimulation   Electrical Stimulation Location  right hip and mid lateral thigh    Electrical Stimulation Action  IFC    Electrical Stimulation Parameters  supine    Electrical Stimulation Goals  Pain      Manual Therapy   Manual Therapy  Soft tissue mobilization    Manual therapy comments  PROM of the HS and ITB's    Soft tissue mobilization  STM to the right TFL and ITB, performed some manual traction of the hip joint and the leg, PROM of K2C and some abduction today       Trigger Point Dry Needling - 04/11/19 0001    Consent Given?  Yes    Muscles Treated Lower Quadrant  Vastus lateralis    Muscles Treated Back/Hip  Tensor fascia lata    Vastus lateralis Response  Twitch response elicited    Tensor Fascia Lata Response  Twitch response elicited             PT Short Term  Goals - 02/05/19 0836      PT SHORT TERM GOAL #1   Title  independent with initial HEP    Status  Achieved        PT Long Term Goals - 04/11/19 1005      PT LONG TERM GOAL #1   Title  increase lumbar ROM 25%    Status  Achieved      PT LONG TERM GOAL #2   Title  decrease pain 50%    Status  Partially Met      PT LONG TERM GOAL #3   Title  report no difficulty sleeping    Status  Achieved      PT LONG TERM GOAL #4   Title  resume walking    Status  On-going            Plan - 04/11/19 1002    Clinical Impression Statement  Patient continues to have frustration with pain, she has pain only with weight bearing activities, reports no pain with water activities, she is very tight in the HS and ITB, to the point that she will stop the stretches due to pain.  She is tender in the TFL and ITB area, the GT area is much better, at times has tenderness in the laterl knee and into the peroneals    PT Next Visit Plan  she is to see the MD tomorrow to see if there is anything different that he thinks is going on or  that we should do    Consulted and Agree with Plan of Care  Patient       Patient will benefit from skilled therapeutic intervention in order to improve the following deficits and impairments:  Decreased strength, Decreased range of motion, Improper body mechanics, Pain, Postural dysfunction, Increased muscle spasms, Abnormal gait, Decreased mobility, Decreased activity tolerance, Decreased balance, Difficulty walking, Impaired flexibility  Visit Diagnosis: Pain in right hip  Difficulty in walking, not elsewhere classified     Problem List Patient Active Problem List   Diagnosis Date Noted  . History of uterine cancer 09/19/2013  . Diverticulosis of colon without hemorrhage 09/19/2013  . Postoperative anemia due to acute blood loss 08/13/2013  . OA (osteoarthritis) of knee 08/12/2013    [name], PT 04/11/2019, 10:05 AM  [place] [address] [place], [place], [address] Phone: [phone]   Fax:  [phone]  Name: Rhonda Gregory MRN: [id number] Date of Birth: 11/19/1940

## 2019-04-16 ENCOUNTER — Ambulatory Visit: Payer: Medicare Other | Admitting: Physical Therapy

## 2019-04-18 ENCOUNTER — Ambulatory Visit: Payer: Medicare Other | Admitting: Physical Therapy

## 2019-04-23 ENCOUNTER — Ambulatory Visit: Payer: Medicare Other | Admitting: Physical Therapy

## 2019-04-26 ENCOUNTER — Ambulatory Visit: Payer: Medicare Other | Admitting: Physical Therapy

## 2019-04-30 ENCOUNTER — Ambulatory Visit: Payer: Medicare Other | Admitting: Physical Therapy

## 2019-05-02 ENCOUNTER — Ambulatory Visit: Payer: Medicare Other | Admitting: Physical Therapy

## 2019-05-27 ENCOUNTER — Other Ambulatory Visit: Payer: Self-pay

## 2019-05-27 ENCOUNTER — Encounter: Payer: Self-pay | Admitting: Neurology

## 2019-05-27 DIAGNOSIS — R202 Paresthesia of skin: Secondary | ICD-10-CM

## 2019-05-29 ENCOUNTER — Ambulatory Visit (INDEPENDENT_AMBULATORY_CARE_PROVIDER_SITE_OTHER): Payer: Medicare Other | Admitting: Neurology

## 2019-05-29 ENCOUNTER — Other Ambulatory Visit: Payer: Self-pay

## 2019-05-29 DIAGNOSIS — R202 Paresthesia of skin: Secondary | ICD-10-CM

## 2019-05-29 DIAGNOSIS — G5731 Lesion of lateral popliteal nerve, right lower limb: Secondary | ICD-10-CM

## 2019-05-29 NOTE — Procedures (Signed)
[facility]  [place]  [place], [place] [address] Tel: [phone] Fax:  [phone] Test Date:  05/29/2019  Patient: Rhonda Gregory DOB: [birth year] Physician: Narda Amber, DO  Sex: Female Height: 5\' 1"  Ref Phys: Suella Broad, MD  ID#: [id number] Temp: 34.0C Technician:    Patient Complaints: This is a 78 year old female referred for evaluation of right foot weakness and paresthesias.  NCV & EMG Findings: Extensive electrodiagnostic testing of the right lower extremity shows:  1. Right sural and bilateral superficial peroneal sensory responses are within normal limits. 2. Right peroneal motor response shows asymmetrically reduced amplitude at the extensor digitorum brevis and reduced conduction velocity across the fibular head (Poplt-B Fib, 38 m/s).  Right peroneal motor response at the tibialis anterior shows slowed conduction velocity across the fibular head (Poplit-Fib Head, 33 m/s).  Right tibial motor responses within normal limits.   3. Right tibial H reflex study is within normal limits.   4. Chronic motor axonal loss changes are seen affecting the anterior tibialis, fibularis longus, extensor hallucis longus, and gluteus medius muscles.  Fibrillation potentials are seen in the tibialis anterior and extensor hallucis longus muscles.    Impression: 1. 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 2. A superimposed chronic L5 radiculopathy is also likely, mild.   ___________________________ Narda Amber, DO    Nerve Conduction Studies Anti Sensory Summary Table   Site NR Peak (ms) Norm Peak (ms) P-T Amp (V) Norm P-T Amp  Left Sup Peroneal Anti Sensory (Ant Lat Mall)  34C  12 cm    2.1 <4.6 7.7 >3  Right Sup Peroneal Anti Sensory (Ant Lat Mall)  34C  12 cm    2.4 <4.6 6.9 >3  Right Sural Anti Sensory (Lat Mall)  34C  Calf    2.6 <4.6 6.7 >3   Motor Summary Table   Site NR Onset (ms) Norm Onset  (ms) O-P Amp (mV) Norm O-P Amp Site1 Site2 Delta-0 (ms) Dist (cm) Vel (m/s) Norm Vel (m/s)  Left Peroneal Motor (Ext Dig Brev)  34C  Ankle    3.4 <6.0 4.1 >2.5 B Fib Ankle 6.6 33.0 50 >40  B Fib    10.0  3.4  Poplt B Fib 1.2 7.0 58 >40  Poplt    11.2  3.2         Right Peroneal Motor (Ext Dig Brev)  34C  Ankle    2.7 <6.0 1.1 >2.5 B Fib Ankle 8.2 34.0 41 >40  B Fib    10.9  0.6  Poplt B Fib 2.1 8.0 38 >40  Poplt    13.0  0.6         Left Peroneal TA Motor (Tib Ant)  34C  Fib Head    2.7 <4.5 5.3 >3 Poplit Fib Head 1.2 7.0 58 >40  Poplit    3.9  5.2         Right Peroneal TA Motor (Tib Ant)  34C  Fib Head    2.4 <4.5 3.2 >3 Poplit Fib Head 2.1 7.0 33 >40  Poplit    4.5  2.2         Right Tibial Motor (Abd Hall Brev)  34C  Ankle    3.0 <6.0 6.7 >4 Knee Ankle 7.2 39.0 54 >40  Knee    10.2  4.3          H Reflex Studies   NR H-Lat (ms) Lat Norm (ms)  L-R H-Lat (ms)  Right Tibial (Gastroc)  34C     29.12 <35    EMG   Side Muscle Ins Act Fibs Psw Fasc Number Recrt Dur Dur. Amp Amp. Poly Poly. Comment  Right GluteusMed Nml Nml Nml Nml 1- Rapid Few 1+ Few 1+ Nml Nml N/A  Right AntTibialis Nml Nml 1+ Nml 1- Rapid Some 1+ Some 1+ Nml Nml N/A  Right Flex Dig Long Nml Nml Nml Nml Nml Nml Nml Nml Nml Nml Nml Nml N/A  Right RectFemoris Nml Nml Nml Nml Nml Nml Nml Nml Nml Nml Nml Nml N/A  Right BicepsFemS Nml Nml Nml Nml Nml Nml Nml Nml Nml Nml Nml Nml N/A  Right Gastroc Nml Nml Nml Nml Nml Nml Nml Nml Nml Nml Nml Nml N/A  Right ExtHallLong Nml Nml 1+ Nml 3- Rapid Most 1+ Most 1+ Many 1+ N/A  Right Fibularis Long Nml Nml Nml Nml 1- Rapid Some 1+ Some 1+ Nml Nml N/A  Left AntTibialis Nml Nml Nml Nml Nml Nml Nml Nml Nml Nml Nml Nml N/A      Waveforms:

## 2019-06-18 ENCOUNTER — Encounter: Payer: Medicare Other | Admitting: Neurology

## 2019-07-07 ENCOUNTER — Ambulatory Visit: Payer: Medicare Other | Attending: Internal Medicine

## 2019-07-07 DIAGNOSIS — Z23 Encounter for immunization: Secondary | ICD-10-CM | POA: Insufficient documentation

## 2019-07-07 NOTE — Progress Notes (Signed)
   Covid-19 Vaccination Clinic  Name:  Rhonda Gregory    MRN: [id number] DOB: [birth year]  07/07/2019  Rhonda Gregory was observed post Covid-19 immunization for 15 minutes without incidence. She was provided with Vaccine Information Sheet and instruction to access the V-Safe system.   Rhonda Gregory was instructed to call 911 with any severe reactions post vaccine: [place] Kitchen Difficulty breathing  . Swelling of your face and throat  . A fast heartbeat  . A bad rash all over your body  . Dizziness and weakness

## 2019-07-09 ENCOUNTER — Encounter: Payer: Medicare Other | Admitting: Neurology

## 2019-07-25 ENCOUNTER — Ambulatory Visit: Payer: Medicare PPO

## 2019-07-29 NOTE — Patient Instructions (Addendum)
DUE TO COVID-19 ONLY ONE VISITOR IS ALLOWED TO COME WITH YOU AND STAY IN THE WAITING ROOM ONLY DURING PRE OP AND PROCEDURE DAY OF SURGERY. THE 1 VISITOR MAY VISIT WITH YOU AFTER SURGERY IN YOUR PRIVATE ROOM DURING VISITING HOURS ONLY!   YOU NEED TO HAVE A COVID 19 TEST ON___2/11/2021____ @___11 :00AM____, THIS TEST MUST BE DONE BEFORE SURGERY, COME  [address], [place] , [address].  ([place]) ONCE YOUR COVID TEST IS COMPLETED, PLEASE BEGIN THE QUARANTINE INSTRUCTIONS AS OUTLINED IN YOUR HANDOUT.   PLEASE BRING CPAP MASK AND  TUBING ONLY. DEVICE WILL BE PROVIDED!               Rhonda Gregory   Your procedure is scheduled on: 08/05/2019   Report to [facility] Main  Entrance    Report to admitting at 2:15PM     Call this number if you have problems the morning of surgery [phone]     Remember: Do not eat food AFTER MIDNIGHT.  YOU MAY DRINK CLEAR LIQUIDS FROM MIDNIGHT UNTIL 10:15AM. NOTHING BY MOUTH AFTER 10:15AM!   CLEAR LIQUID DIET   Foods Allowed                                                                     Foods Excluded  Coffee and tea, regular and decaf                             liquids that you cannot  Plain Jell-O any favor except red or purple                                           see through such as: Fruit ices (not with fruit pulp)                                     milk, soups, orange juice  Iced Popsicles                                    All solid food Carbonated beverages, regular and diet                                    Cranberry, grape and apple juices Sports drinks like Gatorade Lightly seasoned clear broth or consume(fat free) Sugar, honey syrup  Sample Menu Breakfast                                Lunch                                     Supper Cranberry juice  Beef broth                            Chicken broth Jell-O                                     Grape juice                            Apple juice Coffee or tea                        Jell-O                                      Popsicle                                                Coffee or tea                        Coffee or tea  _____________________________________________________________________   BRUSH YOUR TEETH MORNING OF SURGERY AND RINSE YOUR MOUTH OUT, NO CHEWING GUM CANDY OR MINTS. UNTIL 10:15 AM    Take these medicines the morning of surgery with A SIP OF WATER: GABAPENTIN                                 You may not have any metal on your body including hair pins and              piercings  Do not wear jewelry, make-up, lotions, powders or perfumes, deodorant             Do not wear nail polish on your fingernails.  Do not shave  48 hours prior to surgery.                 Do not bring valuables to the hospital. [place].  Contacts, dentures or bridgework may not be worn into surgery.      Patients discharged the day of surgery will not be allowed to drive home. IF YOU ARE HAVING SURGERY AND GOING HOME THE SAME DAY, YOU MUST HAVE AN ADULT TO DRIVE YOU HOME AND BE WITH YOU FOR 24 HOURS. YOU MAY GO HOME BY TAXI OR UBER OR ORTHERWISE, BUT AN ADULT MUST ACCOMPANY YOU HOME AND STAY WITH YOU FOR 24 HOURS.  Name and phone number of your driver:  Special Instructions: N/A              Please read over the following fact sheets you were given: _____________________________________________________________________            [facility] - Preparing for Surgery Before surgery, you can play an important role.  Because skin is not sterile, your skin needs to be as free of germs as possible.  You can reduce the number of germs on your skin by washing with CHG (chlorahexidine gluconate) soap  before surgery.  CHG is an antiseptic cleaner which kills germs and bonds with the skin to continue killing germs even after washing. Please DO NOT use if you have an  allergy to CHG or antibacterial soaps.  If your skin becomes reddened/irritated stop using the CHG and inform your nurse when you arrive at Short Stay. Do not shave (including legs and underarms) for at least 48 hours prior to the first CHG shower.  You may shave your face/neck. Please follow these instructions carefully:  1.  Shower with CHG Soap the night before surgery and the  morning of Surgery.  2.  If you choose to wash your hair, wash your hair first as usual with your  normal  shampoo.  3.  After you shampoo, rinse your hair and body thoroughly to remove the  shampoo.                           4.  Use CHG as you would any other liquid soap.  You can apply chg directly  to the skin and wash                       Gently with a scrungie or clean washcloth.  5.  Apply the CHG Soap to your body ONLY FROM THE NECK DOWN.   Do not use on face/ open                           Wound or open sores. Avoid contact with eyes, ears mouth and genitals (private parts).                       Wash face,  Genitals (private parts) with your normal soap.             6.  Wash thoroughly, paying special attention to the area where your surgery  will be performed.  7.  Thoroughly rinse your body with warm water from the neck down.  8.  DO NOT shower/wash with your normal soap after using and rinsing off  the CHG Soap.                9.  Pat yourself dry with a clean towel.            10.  Wear clean pajamas.            11.  Place clean sheets on your bed the night of your first shower and do not  sleep with pets. Day of Surgery : Do not apply any lotions/deodorants the morning of surgery.  Please wear clean clothes to the hospital/surgery center.    FAILURE TO FOLLOW THESE INSTRUCTIONS MAY RESULT IN THE CANCELLATION OF YOUR SURGERY PATIENT SIGNATURE_________________________________  NURSE  SIGNATURE__________________________________  ________________________________________________________________________     Rhonda Gregory  An incentive spirometer is a tool that can help keep your lungs clear and active. This tool measures how well you are filling your lungs with each breath. Taking long deep breaths may help reverse or decrease the chance of developing breathing (pulmonary) problems (especially infection) following:  A long period of time when you are unable to move or be active. BEFORE THE PROCEDURE   If the spirometer includes an indicator to show your best effort, your nurse or respiratory therapist will set it to a desired goal.  If possible, sit up straight or  lean slightly forward. Try not to slouch.  Hold the incentive spirometer in an upright position. INSTRUCTIONS FOR USE  1. Sit on the edge of your bed if possible, or sit up as far as you can in bed or on a chair. 2. Hold the incentive spirometer in an upright position. 3. Breathe out normally. 4. Place the mouthpiece in your mouth and seal your lips tightly around it. 5. Breathe in slowly and as deeply as possible, raising the piston or the ball toward the top of the column. 6. Hold your breath for 3-5 seconds or for as long as possible. Allow the piston or ball to fall to the bottom of the column. 7. Remove the mouthpiece from your mouth and breathe out normally. 8. Rest for a few seconds and repeat Steps 1 through 7 at least 10 times every 1-2 hours when you are awake. Take your time and take a few normal breaths between deep breaths. 9. The spirometer may include an indicator to show your best effort. Use the indicator as a goal to work toward during each repetition. 10. After each set of 10 deep breaths, practice coughing to be sure your lungs are clear. If you have an incision (the cut made at the time of surgery), support your incision when coughing by placing a pillow or rolled up towels firmly  against it. Once you are able to get out of bed, walk around indoors and cough well. You may stop using the incentive spirometer when instructed by your caregiver.  RISKS AND COMPLICATIONS  Take your time so you do not get dizzy or light-headed.  If you are in pain, you may need to take or ask for pain medication before doing incentive spirometry. It is harder to take a deep breath if you are having pain. AFTER USE  Rest and breathe slowly and easily.  It can be helpful to keep track of a log of your progress. Your caregiver can provide you with a simple table to help with this. If you are using the spirometer at home, follow these instructions: [place] IF:   You are having difficultly using the spirometer.  You have trouble using the spirometer as often as instructed.  Your pain medication is not giving enough relief while using the spirometer.  You develop fever of 100.5 F (38.1 C) or higher. SEEK IMMEDIATE MEDICAL CARE IF:   You cough up bloody sputum that had not been present before.  You develop fever of 102 F (38.9 C) or greater.  You develop worsening pain at or near the incision site. MAKE SURE YOU:   Understand these instructions.  Will watch your condition.  Will get help right away if you are not doing well or get worse. Document Released: 10/17/2006 Document Revised: 08/29/2011 Document Reviewed: 12/18/2006 [facility] Patient Information 2014 [place], [place].   ________________________________________________________________________

## 2019-07-29 NOTE — Progress Notes (Signed)
NEED PRE-OP ORDERS. PRE-OP APPT IS TOMORROW. THANK YOU !

## 2019-07-30 ENCOUNTER — Encounter (HOSPITAL_COMMUNITY)
Admission: RE | Admit: 2019-07-30 | Discharge: 2019-07-30 | Disposition: A | Discharge status: Home / Self Care | Payer: Medicare PPO | Source: Ambulatory Visit | Attending: Orthopedic Surgery | Admitting: Orthopedic Surgery

## 2019-07-30 ENCOUNTER — Encounter (HOSPITAL_COMMUNITY): Payer: Self-pay

## 2019-07-30 ENCOUNTER — Other Ambulatory Visit: Payer: Self-pay

## 2019-07-30 NOTE — Progress Notes (Signed)
PCP - Shon Baton, MD Cardiologist -   Chest x-ray -  EKG - ordered for 07/30/2019 Stress Test -  ECHO -  Cardiac Cath -   Sleep Study - yes CPAP - yes  Fasting Blood Sugar -  Checks Blood Sugar _____ times a day  Blood Thinner Instructions: Aspirin Instructions: Last Dose:  Anesthesia review:   Patient denies shortness of breath, fever, cough and chest pain at PAT appointment   Patient verbalized understanding of instructions that were given to them at the PAT appointment. Patient was also instructed that they will need to review over the PAT instructions again at home before surgery.

## 2019-07-31 ENCOUNTER — Encounter (INDEPENDENT_AMBULATORY_CARE_PROVIDER_SITE_OTHER): Payer: Self-pay

## 2019-07-31 ENCOUNTER — Encounter (HOSPITAL_COMMUNITY): Admission: RE | Admit: 2019-07-31 | Payer: Medicare PPO | Source: Ambulatory Visit

## 2019-07-31 ENCOUNTER — Encounter (HOSPITAL_COMMUNITY)
Admission: RE | Admit: 2019-07-31 | Discharge: 2019-07-31 | Disposition: A | Discharge status: Home / Self Care | Payer: Medicare PPO | Source: Ambulatory Visit | Attending: Orthopedic Surgery | Admitting: Orthopedic Surgery

## 2019-07-31 ENCOUNTER — Ambulatory Visit: Payer: Medicare PPO | Attending: Internal Medicine

## 2019-07-31 DIAGNOSIS — I10 Essential (primary) hypertension: Secondary | ICD-10-CM | POA: Diagnosis not present

## 2019-07-31 DIAGNOSIS — Z01812 Encounter for preprocedural laboratory examination: Secondary | ICD-10-CM | POA: Diagnosis not present

## 2019-07-31 DIAGNOSIS — Z23 Encounter for immunization: Secondary | ICD-10-CM | POA: Insufficient documentation

## 2019-07-31 DIAGNOSIS — R9431 Abnormal electrocardiogram [ECG] [EKG]: Secondary | ICD-10-CM | POA: Diagnosis not present

## 2019-07-31 DIAGNOSIS — Z01810 Encounter for preprocedural cardiovascular examination: Secondary | ICD-10-CM | POA: Diagnosis not present

## 2019-07-31 LAB — BASIC METABOLIC PANEL
Anion gap: 7 (ref 5–15)
BUN: 31 mg/dL — ABNORMAL HIGH (ref 8–23)
CO2: 28 mmol/L (ref 22–32)
Calcium: 9.6 mg/dL (ref 8.9–10.3)
Chloride: 103 mmol/L (ref 98–111)
Creatinine, Ser: 0.63 mg/dL (ref 0.44–1.00)
GFR calc Af Amer: >60 mL/min (ref >60)
GFR calc non Af Amer: >60 mL/min (ref >60)
Glucose, Bld: 99 mg/dL (ref 70–99)
Potassium: 4.5 mmol/L (ref 3.5–5.1)
Sodium: 138 mmol/L (ref 135–145)

## 2019-07-31 LAB — CBC
HCT: 42.2 % (ref 36.0–46.0)
Hemoglobin: 13.4 g/dL (ref 12.0–15.0)
MCH: 29.6 pg (ref 26.0–34.0)
MCHC: 31.8 g/dL (ref 30.0–36.0)
MCV: 93.4 fL (ref 80.0–100.0)
Platelets: 247 10*3/uL (ref 150–400)
RBC: 4.52 MIL/uL (ref 3.87–5.11)
RDW: 12.1 % (ref 11.5–15.5)
WBC: 6.9 10*3/uL (ref 4.0–10.5)
nRBC: 0 % (ref 0.0–0.2)

## 2019-07-31 LAB — SURGICAL PCR SCREEN
MRSA, PCR: NEGATIVE
Staphylococcus aureus: NEGATIVE

## 2019-07-31 NOTE — Progress Notes (Signed)
   Covid-19 Vaccination Clinic  Name:  Rhonda Gregory    MRN: [id number] DOB: Jan 11, 1941  07/31/2019  Rhonda Gregory was observed post Covid-19 immunization for 15 minutes without incidence. She was provided with Vaccine Information Sheet and instruction to access the V-Safe system.   Rhonda Gregory was instructed to call 911 with any severe reactions post vaccine: Marland Kitchen Difficulty breathing  . Swelling of your face and throat  . A fast heartbeat  . A bad rash all over your body  . Dizziness and weakness    Immunizations Administered    Name Date Dose VIS Date Route   Pfizer COVID-19 Vaccine 07/31/2019 11:15 AM 0.3 mL 05/31/2019 Intramuscular   Manufacturer: [employer], [employer]   Lot: [id number]   [place]: [id number]

## 2019-08-01 ENCOUNTER — Other Ambulatory Visit (HOSPITAL_COMMUNITY)
Admission: RE | Admit: 2019-08-01 | Discharge: 2019-08-01 | Disposition: A | Discharge status: Home / Self Care | Payer: Medicare PPO | Source: Ambulatory Visit | Attending: Orthopedic Surgery | Admitting: Orthopedic Surgery

## 2019-08-01 DIAGNOSIS — Z20822 Contact with and (suspected) exposure to covid-19: Secondary | ICD-10-CM | POA: Insufficient documentation

## 2019-08-01 DIAGNOSIS — Z01812 Encounter for preprocedural laboratory examination: Secondary | ICD-10-CM | POA: Insufficient documentation

## 2019-08-01 LAB — SARS CORONAVIRUS 2 (TAT 6-24 HRS): SARS Coronavirus 2: NEGATIVE

## 2019-08-02 NOTE — Progress Notes (Signed)
Time change completed. patient notified to arrive a 12:25pm . Npo solids after mn , clears until 0800 then total npo. Patient verbalized understanding

## 2019-08-02 NOTE — Progress Notes (Signed)
Pt aware to arrive at [facility] admitting at 1155am on Monday 08/05/2019.

## 2019-08-04 ENCOUNTER — Encounter (HOSPITAL_COMMUNITY): Payer: Self-pay | Admitting: Orthopedic Surgery

## 2019-08-04 NOTE — H&P (Signed)
Rhonda Gregory is an 79 y.o. female.   Chief Complaint: Right leg pain with foot drop HPI: Rhonda Gregory is a 79 yo female with a several month history of right lower extremity pain which has progressed to the point where she recently developed a foot drop. She has had lower back problems with associated right lateral thigh pain and pain in her leg in a L5 distribution. Her lumbar MRI has findings consistent with spinal stenosis but recently she developed weakness in her foot which could not be explained by the MRI findings. An EMG/NCV study had findings consistent with peroneal nerve compression at the fibular head. It is felt that she has a "double crush" phenomena with compression at the lumbar level and fibular head. She presents now for peroneal nerve decompression  Past Medical History:  Diagnosis Date  . Arthritis   . Colon polyps   . Diverticulosis   . Gastric ulcer   . Heartburn   . HLD (hyperlipidemia)   . Hypertension   . PONV (postoperative nausea and vomiting)   . Sleep apnea    wears CPAP  . Uterine cancer ([place]) 1995    Past Surgical History:  Procedure Laterality Date  . ABDOMINAL HYSTERECTOMY     with salpingooophorectomy  . CATARACT EXTRACTION, BILATERAL Bilateral 2019  . COLONOSCOPY W/ POLYPECTOMY    . DUPUYTREN CONTRACTURE RELEASE Bilateral   . HEMORRHOID SURGERY    . KNEE ARTHROSCOPY Right   . 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   Procedure: RIGHT TOTAL KNEE ARTHROPLASTY;  Surgeon: [name], MD;  Location: [facility];  Service: Orthopedics;  Laterality: Right;    Family History  Problem Relation Age of Onset  . Breast cancer Mother   . Colon cancer Mother   . Heart attack Father 26  . Diabetes Father   . Esophageal cancer Neg Hx   . Stomach cancer Neg Hx   . Rectal cancer Neg Hx    Social History:  reports that she quit smoking about 59 years ago. Her smoking use included cigarettes. She has a 4.00 pack-year smoking history. She has never used smokeless  tobacco. She reports previous alcohol use. She reports that she does not use drugs.  Allergies:  Allergies  Allergen Reactions  . Codeine Nausea Only  . Dilaudid [Hydromorphone Hcl] Nausea Only and Other (See Comments)    Abdominal pains    No medications prior to admission.    No results found for this or any previous visit (from the past 48 hour(s)). No results found.  Review of Systems  There were no vitals taken for this visit. Physical Exam Physical Examination: General appearance - alert, well appearing, and in no distress Mental status - alert, oriented to person, place, and time Chest - clear to auscultation, no wheezes, rales or rhonchi, symmetric air entry Heart - normal rate, regular rhythm, normal S1, S2, no murmurs, rubs, clicks or gallops Abdomen - soft, nontender, nondistended, no masses or organomegaly Neurological - alert, oriented, normal speech, no focal findings or movement disorder noted Right lower extremity- Weakness 3=/5 with dorsiflexion of the foot and toes as well as foot eversion. Strength otherwise intact. Sensation diminished L5 distribution   Assessment/Plan Right peroneal nerve palsy- Plan peroneal nerve decompression at the fibular head/neck. Discussed procedure, risks and potential complications with the patient, who elects to proceed. We discussed that this should help the weakness in her foot but the more proximal symptoms will not improve as they are most likely  coming from compression at the lumbar level. She understands this and would like to proceed.  Gaynelle Arabian, MD 08/04/2019, 7:31 AM

## 2019-08-05 ENCOUNTER — Ambulatory Visit (HOSPITAL_COMMUNITY)
Admission: RE | Admit: 2019-08-05 | Discharge: 2019-08-05 | Disposition: A | Discharge status: Home / Self Care | Payer: Medicare PPO | Source: Other Acute Inpatient Hospital | Attending: Orthopedic Surgery | Admitting: Orthopedic Surgery

## 2019-08-05 ENCOUNTER — Encounter (HOSPITAL_COMMUNITY): Payer: Self-pay | Admitting: Orthopedic Surgery

## 2019-08-05 ENCOUNTER — Ambulatory Visit (HOSPITAL_COMMUNITY): Payer: Medicare PPO | Admitting: Physician Assistant

## 2019-08-05 ENCOUNTER — Telehealth (HOSPITAL_COMMUNITY): Payer: Self-pay | Admitting: *Deleted

## 2019-08-05 ENCOUNTER — Ambulatory Visit (HOSPITAL_COMMUNITY): Payer: Medicare PPO | Admitting: Anesthesiology

## 2019-08-05 ENCOUNTER — Encounter (HOSPITAL_COMMUNITY)
Admission: RE | Disposition: A | Discharge status: Home / Self Care | Payer: Self-pay | Source: Other Acute Inpatient Hospital | Attending: Orthopedic Surgery

## 2019-08-05 DIAGNOSIS — G4730 Sleep apnea, unspecified: Secondary | ICD-10-CM | POA: Insufficient documentation

## 2019-08-05 DIAGNOSIS — R531 Weakness: Secondary | ICD-10-CM | POA: Diagnosis not present

## 2019-08-05 DIAGNOSIS — Z8249 Family history of ischemic heart disease and other diseases of the circulatory system: Secondary | ICD-10-CM | POA: Insufficient documentation

## 2019-08-05 DIAGNOSIS — M79604 Pain in right leg: Secondary | ICD-10-CM | POA: Diagnosis not present

## 2019-08-05 DIAGNOSIS — Z9842 Cataract extraction status, left eye: Secondary | ICD-10-CM | POA: Diagnosis not present

## 2019-08-05 DIAGNOSIS — M48061 Spinal stenosis, lumbar region without neurogenic claudication: Secondary | ICD-10-CM | POA: Diagnosis not present

## 2019-08-05 DIAGNOSIS — Z885 Allergy status to narcotic agent status: Secondary | ICD-10-CM | POA: Insufficient documentation

## 2019-08-05 DIAGNOSIS — M21371 Foot drop, right foot: Secondary | ICD-10-CM | POA: Insufficient documentation

## 2019-08-05 DIAGNOSIS — Z8542 Personal history of malignant neoplasm of other parts of uterus: Secondary | ICD-10-CM | POA: Diagnosis not present

## 2019-08-05 DIAGNOSIS — Z9841 Cataract extraction status, right eye: Secondary | ICD-10-CM | POA: Insufficient documentation

## 2019-08-05 DIAGNOSIS — Z6830 Body mass index (BMI) 30.0-30.9, adult: Secondary | ICD-10-CM | POA: Insufficient documentation

## 2019-08-05 DIAGNOSIS — E669 Obesity, unspecified: Secondary | ICD-10-CM | POA: Diagnosis not present

## 2019-08-05 DIAGNOSIS — M1990 Unspecified osteoarthritis, unspecified site: Secondary | ICD-10-CM | POA: Insufficient documentation

## 2019-08-05 DIAGNOSIS — I10 Essential (primary) hypertension: Secondary | ICD-10-CM | POA: Diagnosis not present

## 2019-08-05 DIAGNOSIS — Z87891 Personal history of nicotine dependence: Secondary | ICD-10-CM | POA: Diagnosis not present

## 2019-08-05 DIAGNOSIS — Z96651 Presence of right artificial knee joint: Secondary | ICD-10-CM | POA: Diagnosis not present

## 2019-08-05 DIAGNOSIS — G5731 Lesion of lateral popliteal nerve, right lower limb: Secondary | ICD-10-CM | POA: Diagnosis present

## 2019-08-05 HISTORY — PX: SUPERFICIAL PERONEAL NERVE RELEASE: SHX6200

## 2019-08-05 SURGERY — DECOMPRESSION, NERVE, SUPERFICIAL PERONEAL
Anesthesia: General | Laterality: Right

## 2019-08-05 MED ORDER — PHENYLEPHRINE HCL (PRESSORS) 10 MG/ML IV SOLN
INTRAVENOUS | Status: DC | PRN
  Administered 2019-08-05: 80 ug via INTRAVENOUS

## 2019-08-05 MED ORDER — DEXAMETHASONE SODIUM PHOSPHATE 4 MG/ML IJ SOLN
INTRAMUSCULAR | Status: DC | PRN
  Administered 2019-08-05: 8 mg via INTRAVENOUS

## 2019-08-05 MED ORDER — CHLORHEXIDINE GLUCONATE 4 % EX LIQD: 60.0000 mL | Freq: Once | CUTANEOUS | Status: DC

## 2019-08-05 MED ORDER — ONDANSETRON HCL 4 MG/2ML IJ SOLN
INTRAMUSCULAR | Status: DC | PRN
  Administered 2019-08-05: 4 mg via INTRAVENOUS

## 2019-08-05 MED ORDER — LIDOCAINE HCL (CARDIAC) PF 100 MG/5ML IV SOSY
PREFILLED_SYRINGE | INTRAVENOUS | Status: DC | PRN
  Administered 2019-08-05: 100 mg via INTRAVENOUS

## 2019-08-05 MED ORDER — PROPOFOL 500 MG/50ML IV EMUL
INTRAVENOUS | Status: AC
  Filled 2019-08-05: qty 50

## 2019-08-05 MED ORDER — PROPOFOL 10 MG/ML IV BOLUS
INTRAVENOUS | Status: DC | PRN
  Administered 2019-08-05: 110 mg via INTRAVENOUS
  Administered 2019-08-05: 100 ug/kg/min via INTRAVENOUS
  Administered 2019-08-05: 20 mg via INTRAVENOUS

## 2019-08-05 MED ORDER — LACTATED RINGERS IV SOLN: INTRAVENOUS | Status: DC

## 2019-08-05 MED ORDER — SODIUM CHLORIDE 0.9 % IR SOLN
Status: DC | PRN
  Administered 2019-08-05: 1000 mL

## 2019-08-05 MED ORDER — DEXAMETHASONE SODIUM PHOSPHATE 10 MG/ML IJ SOLN: 8.0000 mg | Freq: Once | INTRAMUSCULAR | Status: DC

## 2019-08-05 MED ORDER — EPHEDRINE 5 MG/ML INJ
INTRAVENOUS | Status: AC
  Filled 2019-08-05: qty 10

## 2019-08-05 MED ORDER — PHENYLEPHRINE 40 MCG/ML (10ML) SYRINGE FOR IV PUSH (FOR BLOOD PRESSURE SUPPORT)
PREFILLED_SYRINGE | INTRAVENOUS | Status: AC
  Filled 2019-08-05: qty 10

## 2019-08-05 MED ORDER — FENTANYL CITRATE (PF) 100 MCG/2ML IJ SOLN
INTRAMUSCULAR | Status: AC
  Filled 2019-08-05: qty 2

## 2019-08-05 MED ORDER — POVIDONE-IODINE 10 % EX SWAB
2.0000 "application " | Freq: Once | CUTANEOUS | Status: AC
  Administered 2019-08-05: 2 via TOPICAL

## 2019-08-05 MED ORDER — ASPIRIN EC 81 MG PO TBEC: 81.0000 mg | DELAYED_RELEASE_TABLET | Freq: Every day | ORAL | 0 refills | Status: AC

## 2019-08-05 MED ORDER — HYDROCODONE-ACETAMINOPHEN 5-325 MG PO TABS: 1.0000 | ORAL_TABLET | Freq: Four times a day (QID) | ORAL | 0 refills | Status: DC | PRN

## 2019-08-05 MED ORDER — FENTANYL CITRATE (PF) 100 MCG/2ML IJ SOLN
INTRAMUSCULAR | Status: DC | PRN
  Administered 2019-08-05: 25 ug via INTRAVENOUS
  Administered 2019-08-05: 50 ug via INTRAVENOUS
  Administered 2019-08-05 (×2): 25 ug via INTRAVENOUS

## 2019-08-05 MED ORDER — OXYCODONE HCL 5 MG PO TABS: 5.0000 mg | ORAL_TABLET | Freq: Once | ORAL | Status: DC | PRN

## 2019-08-05 MED ORDER — OXYCODONE HCL 5 MG/5ML PO SOLN: 5.0000 mg | Freq: Once | ORAL | Status: DC | PRN

## 2019-08-05 MED ORDER — FENTANYL CITRATE (PF) 100 MCG/2ML IJ SOLN: 25.0000 ug | INTRAMUSCULAR | Status: DC | PRN

## 2019-08-05 MED ORDER — ACETAMINOPHEN 10 MG/ML IV SOLN
1000.0000 mg | Freq: Four times a day (QID) | INTRAVENOUS | Status: DC
  Administered 2019-08-05: 1000 mg via INTRAVENOUS
  Filled 2019-08-05: qty 100

## 2019-08-05 MED ORDER — EPHEDRINE SULFATE 50 MG/ML IJ SOLN
INTRAMUSCULAR | Status: DC | PRN
  Administered 2019-08-05 (×2): 10 mg via INTRAVENOUS
  Administered 2019-08-05 (×2): 5 mg via INTRAVENOUS
  Administered 2019-08-05 (×2): 10 mg via INTRAVENOUS

## 2019-08-05 MED ORDER — CEFAZOLIN SODIUM-DEXTROSE 2-4 GM/100ML-% IV SOLN
2.0000 g | INTRAVENOUS | Status: AC
  Administered 2019-08-05: 2 g via INTRAVENOUS
  Filled 2019-08-05: qty 100

## 2019-08-05 MED ORDER — ONDANSETRON HCL 4 MG/2ML IJ SOLN: 4.0000 mg | Freq: Once | INTRAMUSCULAR | Status: DC | PRN

## 2019-08-05 SURGICAL SUPPLY — 46 items
BANDAGE ESMARK 6X9 LF (GAUZE/BANDAGES/DRESSINGS) ×1 IMPLANT
BLADE SURG SZ10 CARB STEEL (BLADE) ×4 IMPLANT
BNDG CMPR 9X6 STRL LF SNTH (GAUZE/BANDAGES/DRESSINGS)
BNDG ELASTIC 6X5.8 VLCR STR LF (GAUZE/BANDAGES/DRESSINGS) IMPLANT
BNDG ESMARK 6X9 LF (GAUZE/BANDAGES/DRESSINGS)
CLOSURE STERI-STRIP 1/2X4 (GAUZE/BANDAGES/DRESSINGS) ×1
CLOSURE WOUND 1/2 X4 (GAUZE/BANDAGES/DRESSINGS)
CLSR STERI-STRIP ANTIMIC 1/2X4 (GAUZE/BANDAGES/DRESSINGS) ×1 IMPLANT
COUNTER NEEDLE 20 DBL MAG RED (NEEDLE) ×2 IMPLANT
COVER WAND RF STERILE (DRAPES) IMPLANT
CUFF TOURN SGL QUICK 24 (TOURNIQUET CUFF)
CUFF TOURN SGL QUICK 34 (TOURNIQUET CUFF) ×3
CUFF TRNQT CYL 24X4X16.5-23 (TOURNIQUET CUFF) IMPLANT
CUFF TRNQT CYL 34X4.125X (TOURNIQUET CUFF) ×1 IMPLANT
DRAPE U-SHAPE 47X51 STRL (DRAPES) ×3 IMPLANT
DRSG EMULSION OIL 3X3 NADH (GAUZE/BANDAGES/DRESSINGS) IMPLANT
DRSG MEPILEX BORDER 4X8 (GAUZE/BANDAGES/DRESSINGS) ×2 IMPLANT
DURAPREP 26ML APPLICATOR (WOUND CARE) ×3 IMPLANT
ELECT REM PT RETURN 15FT ADLT (MISCELLANEOUS) ×3 IMPLANT
GAUZE SPONGE 4X4 12PLY STRL (GAUZE/BANDAGES/DRESSINGS) ×1 IMPLANT
GLOVE BIO SURGEON STRL SZ7 (GLOVE) ×1 IMPLANT
GLOVE BIO SURGEON STRL SZ8 (GLOVE) ×6 IMPLANT
GLOVE BIOGEL PI IND STRL 6.5 (GLOVE) IMPLANT
GLOVE BIOGEL PI IND STRL 7.5 (GLOVE) ×1 IMPLANT
GLOVE BIOGEL PI IND STRL 8 (GLOVE) ×2 IMPLANT
GLOVE BIOGEL PI INDICATOR 6.5 (GLOVE)
GLOVE BIOGEL PI INDICATOR 7.5 (GLOVE) ×2
GLOVE BIOGEL PI INDICATOR 8 (GLOVE) ×2
GLOVE SURG SS PI 6.5 STRL IVOR (GLOVE) IMPLANT
GOWN STRL REUS W/ TWL LRG LVL3 (GOWN DISPOSABLE) ×2 IMPLANT
GOWN STRL REUS W/ TWL XL LVL3 (GOWN DISPOSABLE) ×1 IMPLANT
GOWN STRL REUS W/TWL LRG LVL3 (GOWN DISPOSABLE) ×3
GOWN STRL REUS W/TWL XL LVL3 (GOWN DISPOSABLE) ×6
KIT BASIN OR (CUSTOM PROCEDURE TRAY) ×3 IMPLANT
KIT TURNOVER KIT A (KITS) IMPLANT
NS IRRIG 1000ML POUR BTL (IV SOLUTION) ×3 IMPLANT
PACK TOTAL KNEE CUSTOM (KITS) ×3 IMPLANT
PAD CAST 4YDX4 CTTN HI CHSV (CAST SUPPLIES) ×1 IMPLANT
PADDING CAST COTTON 4X4 STRL (CAST SUPPLIES)
PADDING CAST COTTON 6X4 STRL (CAST SUPPLIES) ×3 IMPLANT
PENCIL SMOKE EVACUATOR (MISCELLANEOUS) ×2 IMPLANT
STRIP CLOSURE SKIN 1/2X4 (GAUZE/BANDAGES/DRESSINGS) IMPLANT
SUT MNCRL AB 4-0 PS2 18 (SUTURE) ×3 IMPLANT
SUT VIC AB 2-0 CT1 27 (SUTURE) ×6
SUT VIC AB 2-0 CT1 TAPERPNT 27 (SUTURE) ×2 IMPLANT
YANKAUER SUCT BULB TIP 10FT TU (MISCELLANEOUS) ×3 IMPLANT

## 2019-08-05 NOTE — Transfer of Care (Signed)
Immediate Anesthesia Transfer of Care Note  Patient: BULAH COMPTON  Procedure(s) Performed: Right peroneal nerve decompression (Right )  Patient Location: PACU  Anesthesia Type:General  Level of Consciousness: drowsy, patient cooperative and responds to stimulation  Airway & Oxygen Therapy: Patient Spontanous Breathing and Patient connected to face mask oxygen  Post-op Assessment: Report given to RN and Post -op Vital signs reviewed and stable  Post vital signs: Reviewed and stable  Last Vitals:  Vitals Value Taken Time  BP 141/78 08/05/19 1351  Temp    Pulse 81 08/05/19 1353  Resp 16 08/05/19 1353  SpO2 100 % 08/05/19 1353  Vitals shown include unvalidated device data.  Last Pain:  Vitals:   08/05/19 1109  TempSrc:   PainSc: 0-No pain         Complications: No apparent anesthesia complications

## 2019-08-05 NOTE — Brief Op Note (Signed)
08/05/2019  1:28 PM  PATIENT:  Rhonda Gregory  80 y.o. female  PRE-OPERATIVE DIAGNOSIS:  Right peroneal nerve palsy  POST-OPERATIVE DIAGNOSIS:  Right peroneal nerve palsy  PROCEDURE:  Procedure(s) with comments: Right peroneal nerve decompression (Right) - 33min  SURGEON:  Surgeon(s) and Role:    Gaynelle Arabian, MD - Primary  PHYSICIAN ASSISTANT:   ASSISTANTS: Kristie Edmisten PA-C   ANESTHESIA:   general  EBL:  5 ml  BLOOD ADMINISTERED:none  DRAINS: none   LOCAL MEDICATIONS USED:  NONE  COUNTS:  YES  TOURNIQUET:   Total Tourniquet Time Documented: Thigh (Right) - 14 minutes Total: Thigh (Right) - 14 minutes   DICTATION: .Other Dictation: Dictation Number [id number]  PLAN OF CARE: Discharge to home after PACU  PATIENT DISPOSITION:  PACU - hemodynamically stable.

## 2019-08-05 NOTE — Anesthesia Postprocedure Evaluation (Signed)
Anesthesia Post Note  Patient: Rhonda Gregory  Procedure(s) Performed: Right peroneal nerve decompression (Right )     Patient location during evaluation: PACU Anesthesia Type: General Level of consciousness: awake and alert Pain management: pain level controlled Vital Signs Assessment: post-procedure vital signs reviewed and stable Respiratory status: spontaneous breathing, nonlabored ventilation and respiratory function stable Cardiovascular status: blood pressure returned to baseline and stable Postop Assessment: no apparent nausea or vomiting Anesthetic complications: no    Last Vitals:  Vitals:   08/05/19 1445 08/05/19 1512  BP: 132/70 (!) 153/87  Pulse: 79 81  Resp: 12 18  Temp:  36.4 C  SpO2: 96% 98%    Last Pain:  Vitals:   08/05/19 1512  TempSrc:   PainSc: 0-No pain                 Audry Pili

## 2019-08-05 NOTE — Anesthesia Procedure Notes (Signed)
Procedure Name: LMA Insertion Date/Time: 08/05/2019 12:54 PM Performed by: Lavina Hamman, CRNA Pre-anesthesia Checklist: Patient identified, Emergency Drugs available, Suction available and Patient being monitored Patient Re-evaluated:Patient Re-evaluated prior to induction Oxygen Delivery Method: Circle System Utilized Preoxygenation: Pre-oxygenation with 100% oxygen Induction Type: IV induction Ventilation: Mask ventilation without difficulty LMA: LMA inserted LMA Size: 4.0 Number of attempts: 1 Airway Equipment and Method: Bite block Placement Confirmation: positive ETCO2 Tube secured with: Tape Dental Injury: Teeth and Oropharynx as per pre-operative assessment

## 2019-08-05 NOTE — Interval H&P Note (Signed)
History and Physical Interval Note:  08/05/2019 12:35 PM  Rhonda Gregory  has presented today for surgery, with the diagnosis of Right peroneal nerve palsy.  The various methods of treatment have been discussed with the patient and family. After consideration of risks, benefits and other options for treatment, the patient has consented to  Procedure(s) with comments: Right peroneal nerve decompression (Right) - 38min as a surgical intervention.  The patient's history has been reviewed, patient examined, no change in status, stable for surgery.  I have reviewed the patient's chart and labs.  Questions were answered to the patient's satisfaction.     Pilar Plate Zavia Pullen

## 2019-08-05 NOTE — Discharge Instructions (Addendum)
Dr. Gaynelle Arabian Total [facility] Specialist [facility] [address], [place], [place] [address] [phone]  Peroneal Nerve Decompression Postoperative Instructions  BLOOD CLOT PREVENTION . Take an 81 mg Aspirin once a day for four weeks following surgery to prevent blood clots.  HOME CARE INSTRUCTIONS  Remove items at home which could result in a fall. This includes throw rugs or furniture in walking pathways.   ICE to the affected leg every three hours for 30 minutes at a time and then as needed for pain and swelling.  Continue to use ice on the leg for pain and swelling from surgery. You may notice swelling that will progress down to the foot and ankle.  This is normal after surgery.  Elevate the leg when you are not up walking on it.    Continue to use the breathing machine which will help keep your temperature down.  It is common for your temperature to cycle up and down following surgery, especially at night when you are not up moving around and exerting yourself.  The breathing machine keeps your lungs expanded and your temperature down.  DIET You may resume your previous home diet once your are discharged from the hospital.  DRESSING / WOUND CARE / SHOWERING You may change your dressing 2 days after surgery.  Then change the dressing every day with sterile gauze.  Please use good hand washing techniques before changing the dressing.  Do not use any lotions or creams on the incision until instructed by your surgeon. You may start showering three days following surgery but do not submerge the incision under water.Just pat the incision dry and apply a dry gauze dressing on daily. Change the surgical dressing daily and reapply a dry dressing each time.  ACTIVITY Avoid periods of inactivity such as sitting longer than an hour when not asleep. This helps prevent blood clots.   You may return to work once you are cleared by your doctor.  Do not drive a car for 6 weeks  or until released by you surgeon.  Do not drive while taking narcotics.  WEIGHT BEARING Weight bearing as tolerated with assist device (walker, cane, etc) as directed, use it as long as suggested by your surgeon or therapist, typically at least 4-6 weeks.  POSTOPERATIVE CONSTIPATION PROTOCOL Constipation - defined medically as fewer than three stools per week and severe constipation as less than one stool per week.  One of the most common issues patients have following surgery is constipation.  Even if you have a regular bowel pattern at home, your normal regimen is likely to be disrupted due to multiple reasons following surgery.  Combination of anesthesia, postoperative narcotics, change in appetite and fluid intake all can affect your bowels.  In order to avoid complications following surgery, here are some recommendations in order to help you during your recovery period.  Colace (docusate) - Pick up an over-the-counter form of Colace or another stool softener and take twice a day as long as you are requiring postoperative pain medications.  Take with a full glass of water daily.  If you experience loose stools or diarrhea, hold the colace until you stool forms back up.  If your symptoms do not get better within 1 week or if they get worse, check with your doctor.  Dulcolax (bisacodyl) - Pick up over-the-counter and take as directed by the product packaging as needed to assist with the movement of your bowels.  Take with a full glass  of water.  Use this product as needed if not relieved by Colace only.   MiraLax (polyethylene glycol) - Pick up over-the-counter to have on hand.  MiraLax is a solution that will increase the amount of water in your bowels to assist with bowel movements.  Take as directed and can mix with a glass of water, juice, soda, coffee, or tea.  Take if you go more than two days without a movement. Do not use MiraLax more than once per day. Call your doctor if you are still  constipated or irregular after using this medication for 7 days in a row.  If you continue to have problems with postoperative constipation, please contact the office for further assistance and recommendations.  If you experience "the worst abdominal pain ever" or develop nausea or vomiting, please contact the office immediatly for further recommendations for treatment.  ITCHING  If you experience itching with your medications, try taking only a single pain pill, or even half a pain pill at a time.  You can also use Benadryl over the counter for itching or also to help with sleep.   MEDICATIONS See your medication summary on the "After Visit Summary" that the nursing staff will review with you prior to discharge.  You may have some home medications which will be placed on hold until you complete the course of blood thinner medication.  It is important for you to complete the blood thinner medication as prescribed by your surgeon.  Continue your approved medications as instructed at time of discharge.  PRECAUTIONS If you experience chest pain or shortness of breath - call 911 immediately for transfer to the hospital emergency department.  If you develop a fever greater that 101 F, purulent drainage from wound, increased redness or drainage from wound, foul odor from the wound/dressing, or calf pain - CONTACT YOUR SURGEON.                                                   FOLLOW-UP APPOINTMENTS Make sure you keep all of your appointments after your operation with your surgeon and caregivers. You should call the office at the above phone number and make an appointment for approximately two weeks after the date of your surgery or on the date instructed by your surgeon outlined in the "After Visit Summary".  Pick up stool softner and laxative for home use following surgery while on pain medications. Do not submerge incision under water. Please use good hand washing techniques while changing dressing  each day. May shower starting three days after surgery. Please use a clean towel to pat the incision dry following showers. Continue to use ice for pain and swelling after surgery. Do not use any lotions or creams on the incision until instructed by your surgeon.

## 2019-08-05 NOTE — Anesthesia Preprocedure Evaluation (Addendum)
Anesthesia Evaluation  Patient identified by MRN, date of birth, ID band Patient awake    Reviewed: Allergy & Precautions, NPO status , Patient's Chart, lab work & pertinent test results  History of Anesthesia Complications (+) PONV and history of anesthetic complications  Airway Mallampati: II  TM Distance: >3 FB Neck ROM: Full    Dental  (+) Dental Advisory Given, Teeth Intact   Pulmonary sleep apnea and Continuous Positive Airway Pressure Ventilation , former smoker,    Pulmonary exam normal        Cardiovascular hypertension, Pt. on medications Normal cardiovascular exam     Neuro/Psych negative neurological ROS  negative psych ROS   GI/Hepatic Neg liver ROS, PUD,   Endo/Other   Obesity   Renal/GU negative Renal ROS     Musculoskeletal  (+) Arthritis ,   Abdominal   Peds  Hematology negative hematology ROS (+)  Plt 247k    Anesthesia Other Findings Covid neg 08/01/19   Reproductive/Obstetrics  Uterine cancer s/p hysterectomy                             Anesthesia Physical Anesthesia Plan  ASA: II  Anesthesia Plan: General   Post-op Pain Management:    Induction: Intravenous  PONV Risk Score and Plan: 4 or greater and Treatment may vary due to age or medical condition, Ondansetron and Propofol infusion  Airway Management Planned: LMA  Additional Equipment: None  Intra-op Plan:   Post-operative Plan: Extubation in OR  Informed Consent: I have reviewed the patients History and Physical, chart, labs and discussed the procedure including the risks, benefits and alternatives for the proposed anesthesia with the patient or authorized representative who has indicated his/her understanding and acceptance.     Dental advisory given  Plan Discussed with: CRNA and Anesthesiologist  Anesthesia Plan Comments:        Anesthesia Quick Evaluation

## 2019-08-05 NOTE — Op Note (Signed)
NAME: Rhonda Gregory, Rhonda Gregory MEDICAL RECORD [id number] ACCOUNT [id number] DATE OF BIRTH:11-07-1940 FACILITY: [facility] LOCATION: [facility] PHYSICIAN:Arless Vineyard Zella Ball, MD  OPERATIVE REPORT  DATE OF PROCEDURE:  08/05/2019  PREOPERATIVE DIAGNOSIS:  Right peroneal nerve palsy.  POSTOPERATIVE DIAGNOSIS:  Right peroneal nerve palsy.  PROCEDURE:  Right peroneal nerve decompression.  SURGEON:  Gaynelle Arabian, MD  ASSISTANT:  Theresa Duty, PA-C  ANESTHESIA:  General.  ESTIMATED BLOOD LOSS:  Minimal.  DRAINS:  None.  TOURNIQUET TIME:  13 minutes at 300 mmHg.  COMPLICATIONS:  None.  CONDITION:  Stable to recovery.  BRIEF CLINICAL NOTE:  The patient is a 79 year old female who has had significant right lower extremity discomfort for several months now.  Recently, however, she developed weakness in dorsiflexion of her right foot, now with a foot drop.  She has had a  previous lumbar workup which showed some stenosis at L4-L5, but not enough to potentially cause a foot drop.  She had a nerve conduction study done which showed peroneal nerve compression at the fibular head.  She presents now for peroneal nerve  decompression in an attempt to restore function to her foot.  PROCEDURE IN DETAIL:  After successful administration of general anesthetic, the patient was placed in the left lateral decubitus position with the right side up and held with a hip positioner.  The right lower extremity was isolated from her perineum  with plastic drapes and prepped and draped in the usual sterile fashion.  About a 4 inch incision was made just slightly posterior to the lateral hamstring proximally and fibular head distally.  Skin was cut with a 10 blade through subcutaneous tissue,  which was carefully dissected to the fascia lata.  We went posterior to this to find the proximal extent of the peroneal nerve.  There was no evidence of any compression proximally.  We traced the course of the nerve distally and  dissected any overlying  soft tissue off of the nerve carefully.  We got down to the fibular neck.  There were tight bands, especially when the nerve was entering into the lower leg.   A Freer elevator was placed between the nerve and the bands and the bands were then released  with the nerve carefully protected underneath.  The nerve had a healthy-looking appearance and did not have any signs of significant flattening.  I traced this down below the fibular neck and passed a Soil scientist which passed easily and the nerve did  not have any further compression.  The wound was then copiously irrigated with saline solution and the tourniquet released, total time of 13 minutes.  There was minimal bleeding identified and that which was identified was stopped with electrocautery.   The deep subcutaneous was closed with interrupted 2-0 Vicryl, superficial subcutaneous with interrupted 2-0 Vicryl and subcuticular with running 4-0 Monocryl.  Incision was cleaned and dried and Steri-Strips applied.  A bulky sterile dressing was applied  and she was awakened and transported to recovery in stable condition.  Note that a surgical assistant was a medical necessity for this procedure to provide gentle retraction and provide exposure of the nerve during this operation.  VN/NUANCE  D:08/05/2019 T:08/05/2019 JOB:[id number]

## 2019-09-23 ENCOUNTER — Ambulatory Visit: Payer: Medicare PPO | Admitting: Physical Therapy

## 2019-09-25 ENCOUNTER — Encounter: Payer: Self-pay | Admitting: Physical Therapy

## 2019-09-25 ENCOUNTER — Other Ambulatory Visit: Payer: Self-pay

## 2019-09-25 ENCOUNTER — Ambulatory Visit: Payer: Medicare PPO | Attending: Orthopedic Surgery | Admitting: Physical Therapy

## 2019-09-25 DIAGNOSIS — M5441 Lumbago with sciatica, right side: Secondary | ICD-10-CM | POA: Diagnosis present

## 2019-09-25 DIAGNOSIS — R262 Difficulty in walking, not elsewhere classified: Secondary | ICD-10-CM

## 2019-09-25 DIAGNOSIS — M25551 Pain in right hip: Secondary | ICD-10-CM | POA: Insufficient documentation

## 2019-09-25 NOTE — Patient Instructions (Signed)
Access Code: [email or web address] URL: [email or web address] Date: 09/25/2019 Prepared by: Lum Babe  Exercises Hooklying Single Knee to Chest Stretch - 2 x daily - 7 x weekly - 1 sets - 10 reps - 10 hold Supine Double Knee to Chest - 2 x daily - 7 x weekly - 1 sets - 10 reps - 10 hold Supine Lower Trunk Rotation - 2 x daily - 7 x weekly - 1 sets - 10 reps - 10 hold Supine Piriformis Stretch Pulling Heel to Hip - 2 x daily - 7 x weekly - 1 sets - 5 reps - 20 hold Seated Ankle Dorsiflexion AROM - 2 x daily - 7 x weekly - 3 sets - 10 reps - 2 hold Seated Heel Raise - 2 x daily - 7 x weekly - 3 sets - 10 reps - 32 hold

## 2019-09-25 NOTE — Therapy (Signed)
[facility] [place], [place], [address] Phone: [phone]   Fax:  [phone]  Physical Therapy Evaluation  Patient Details  Name: Rhonda Gregory MRN: [id number] Date of Birth: 1941/04/16 Referring Provider (PT): Aluisio   Encounter Date: 09/25/2019  PT End of Session - 09/25/19 1645    Visit Number  1    Date for PT Re-Evaluation  11/25/19    Authorization Type  [employer]    PT Start Time  1600    PT Stop Time  1652    PT Time Calculation (min)  52 min    Activity Tolerance  Patient tolerated treatment well    Behavior During Therapy  [facility] for tasks assessed/performed       Past Medical History:  Diagnosis Date  . Arthritis   . Colon polyps   . Diverticulosis   . Gastric ulcer   . Heartburn   . HLD (hyperlipidemia)   . Hypertension   . PONV (postoperative nausea and vomiting)   . Sleep apnea    wears CPAP  . Uterine cancer ([place]) 1995    Past Surgical History:  Procedure Laterality Date  . ABDOMINAL HYSTERECTOMY     with salpingooophorectomy  . CATARACT EXTRACTION, BILATERAL Bilateral 2019  . COLONOSCOPY W/ POLYPECTOMY    . DUPUYTREN CONTRACTURE RELEASE Bilateral   . HEMORRHOID SURGERY    . KNEE ARTHROSCOPY Right   . SUPERFICIAL PERONEAL NERVE RELEASE Right 08/05/2019   Procedure: Right peroneal nerve decompression;  Surgeon: Gaynelle Arabian, MD;  [facility]: [facility];  Service: Orthopedics;  Laterality: Right;  81min  . 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   Subjective Assessment - 09/25/19 1607    Subjective  Pateint underwent a right superficial peroneal nerve decompression on 08/05/19.  She reports that prior to the surgery she had a foot drop and had a few falls.  She reports doing much better since the  surgery, she reports weakness in the legs as she was having difficulty walking and doing any exercises for about 6 months prior to the surgery.  Because of how she has had to walk she is having low back pain and right thigh pain and weakness.  She has had an injection in the low back, she has DDD. She reports the most difficulty walking and standing    Limitations  Lifting;Standing;Walking;House hold activities    How long can you stand comfortably?  8 minutes    How long can you walk comfortably?  5 minutes    Patient Stated Goals  have less pain, walk better    Currently in Pain?  Yes    Pain Score  3     Pain Location  Back    Pain Orientation  Right;Lower    Pain Descriptors / Indicators  Aching;Constant    Pain Radiating Towards  pain in the right buttock and the right lateral thigh    Pain Onset  More than a month ago    Pain Frequency  Constant    Aggravating Factors   walking and standing, pain up to 9/10    Pain Relieving Factors  rest, getting off of the feet    Effect of Pain on Daily Activities  walking, standing, bending, lifting  [facility] PT Assessment - 09/25/19 0001      Assessment   Medical Diagnosis  s/p right peroneal nerve decompression, low back pain, right hip pain    Referring Provider (PT)  Aluisio    Onset Date/Surgical Date  08/05/19    Prior Therapy  yes last year for the hip and knee pain      Precautions   Precautions  None      Balance Screen   Has the patient fallen in the past 6 months  Yes    How many times?  2    Has the patient had a decrease in activity level because of a fear of falling?   No    Is the patient reluctant to leave their home because of a fear of falling?   No      Home Environment   Additional Comments  reports that she does care for grandchildren at times, does some yardwork and does the housework      Prior Function   Level of Independence  Independent    Vocation  Retired    Leisure  chair yoga, some exercises at  [employer], was walking 1 mile a day      Posture/Postural Control   Posture Comments  fwd head, rounded shoulders      ROM / Strength   AROM / PROM / Strength  AROM;Strength      AROM   Overall AROM Comments  Lumbar ROM is decreased 25% with c/o tightness,     AROM Assessment Site  Knee;Ankle    Right/Left Knee  Right    Right Knee Extension  10    Right Knee Flexion  110    Right/Left Ankle  Right    Right Ankle Dorsiflexion  5      Strength   Overall Strength Comments  right hip 4/5,     Strength Assessment Site  Ankle;Knee    Right/Left Knee  Right    Right Knee Flexion  3+/5    Right Knee Extension  3+/5    Right/Left Ankle  Right    Right Ankle Dorsiflexion  3+/5    Right Ankle Plantar Flexion  3+/5    Right Ankle Inversion  4-/5    Right Ankle Eversion  3+/5      Palpation   Palpation comment  very tender inthe right buttock , right SI, right GT and into the ITB      Ambulation/Gait   Gait Comments  mild limp on the right, no device, she does report an occasional drag of the right foot with walking, I did not observe this today                Objective measurements completed on examination: See above findings.                PT Short Term Goals - 09/25/19 1651      PT SHORT TERM GOAL #1   Title  independent with initial HEP    Time  2    Period  Weeks    Status  New        PT Long Term Goals - 09/25/19 1651      PT LONG TERM GOAL #1   Title  increase lumbar ROM 25%    Time  8    Period  Weeks    Status  New      PT LONG TERM GOAL #2   Title  decrease  pain 50%    Time  8    Period  Weeks    Status  New      PT LONG TERM GOAL #3   Title  increase right ankle DF to 12 degrees    Time  8    Period  Weeks    Status  New      PT LONG TERM GOAL #4   Title  walk > 15 minutes without pain >4/10    Time  8    Period  Weeks    Status  New             Plan - 09/25/19 1646    Clinical Impression Statement  Patient was  seen here last year for right hip and leg pain,  she reports that she started having right foot drop and had a couple of falls,  she was found to have a nerve compression, she underwent a right superficial peroneal nerve decompression on 08/05/19.  She reports good relief at the knee with this, she does have weakness of the right ankle and knee, she continues to report difficulty walking for > 5 minutes or standing.  She has limited right ankle DF.  She is tender and has pain in the right SI., buttock and GT area    Stability/Clinical Decision Making  Evolving/Moderate complexity    Clinical Decision Making  Low    Rehab Potential  Good    PT Frequency  2x / week    PT Duration  8 weeks    PT Treatment/Interventions  ADLs/Self Care Home Management;Cryotherapy;Electrical Stimulation;Moist Heat;Traction;Ultrasound;Therapeutic activities;Therapeutic exercise;Neuromuscular re-education;Patient/family education;Manual techniques;Iontophoresis 4mg /ml Dexamethasone;Gait training;Stair training;Functional mobility training;Dry needling    PT Next Visit Plan  she will be out of town next week, gave her HEP    Consulted and Agree with Plan of Care  Patient       Patient will benefit from skilled therapeutic intervention in order to improve the following deficits and impairments:  Decreased strength, Decreased range of motion, Improper body mechanics, Pain, Postural dysfunction, Increased muscle spasms, Abnormal gait, Decreased mobility, Decreased activity tolerance, Decreased balance, Difficulty walking, Impaired flexibility, Decreased scar mobility  Visit Diagnosis: Difficulty in walking, not elsewhere classified - Plan: PT plan of care cert/re-cert  Acute right-sided low back pain with right-sided sciatica - Plan: PT plan of care cert/re-cert  Pain in right hip - Plan: PT plan of care cert/re-cert     Problem List Patient Active Problem List   Diagnosis Date Noted  . Peroneal mononeuropathy, right  08/05/2019  . History of uterine cancer 09/19/2013  . Diverticulosis of colon without hemorrhage 09/19/2013  . Postoperative anemia due to acute blood loss 08/13/2013  . OA (osteoarthritis) of knee 08/12/2013    [name], PT 09/25/2019, 4:54 PM  [place] [address] [place], [place], [address] Phone: [phone]   Fax:  [phone]  Name: Rhonda Gregory MRN: [id number] Date of Birth: 30-Jun-1940

## 2019-10-10 ENCOUNTER — Ambulatory Visit: Payer: Medicare PPO | Admitting: Physical Therapy

## 2019-10-11 ENCOUNTER — Ambulatory Visit: Payer: Medicare PPO | Admitting: Physical Therapy

## 2019-10-15 ENCOUNTER — Ambulatory Visit: Payer: Medicare PPO | Admitting: Physical Therapy

## 2019-10-15 ENCOUNTER — Encounter: Payer: Self-pay | Admitting: Physical Therapy

## 2019-10-15 ENCOUNTER — Other Ambulatory Visit: Payer: Self-pay

## 2019-10-15 DIAGNOSIS — R262 Difficulty in walking, not elsewhere classified: Secondary | ICD-10-CM | POA: Diagnosis not present

## 2019-10-15 DIAGNOSIS — M5441 Lumbago with sciatica, right side: Secondary | ICD-10-CM

## 2019-10-15 DIAGNOSIS — M25551 Pain in right hip: Secondary | ICD-10-CM

## 2019-10-15 NOTE — Therapy (Signed)
[place], [place], [address] Phone: [phone]   Fax:  [phone]  Physical Therapy Treatment  Patient Details  Name: Rhonda Gregory MRN: [id number] Date of Birth: 1941/06/14 Referring Provider (PT): Aluisio   Encounter Date: 10/15/2019  PT End of Session - 10/15/19 0833    Visit Number  2    Number of Visits  12    Date for PT Re-Evaluation  11/25/19    Authorization Type  [employer]    PT Start Time  0757    PT Stop Time  0855    PT Time Calculation (min)  58 min    Activity Tolerance  Patient tolerated treatment well    Behavior During Therapy  [facility] for tasks assessed/performed       Past Medical History:  Diagnosis Date  . Arthritis   . Colon polyps   . Diverticulosis   . Gastric ulcer   . Heartburn   . HLD (hyperlipidemia)   . Hypertension   . PONV (postoperative nausea and vomiting)   . Sleep apnea    wears CPAP  . Uterine cancer ([place]) 1995    Past Surgical History:  Procedure Laterality Date  . ABDOMINAL HYSTERECTOMY     with salpingooophorectomy  . CATARACT EXTRACTION, BILATERAL Bilateral 2019  . COLONOSCOPY W/ POLYPECTOMY    . DUPUYTREN CONTRACTURE RELEASE Bilateral   . HEMORRHOID SURGERY    . KNEE ARTHROSCOPY Right   . SUPERFICIAL PERONEAL NERVE RELEASE Right 08/05/2019   Procedure: Right peroneal nerve decompression;  Surgeon: Gaynelle Arabian, MD;  [facility];  Service: Orthopedics;  Laterality: Right;  88mn  . 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   Procedure: RIGHT TOTAL KNEE ARTHROPLASTY;  Surgeon: FGearlean Alf MD;  [facility];  Service: Orthopedics;  Laterality: Right;    There were no vitals filed for this visit.  Subjective Assessment - 10/15/19 0758    Subjective  Patient has been on vacation, reports that she did pretty good, still catches her toe at occasionally.  Denies falling    Currently in Pain?  Yes    Pain Score  3     Pain  Location  Back    Pain Orientation  Right;Lower    Aggravating Factors   walking                       [facility] Adult PT Treatment/Exercise - 10/15/19 0001      Ambulation/Gait   Gait Comments  gait around the parking island she had two instances that she caught her toe and stumbled needing Min A to correct balance, she has a limp on the right , she really had a loud right foot fall going down a slope and toward the end on level surfaces      Knee/Hip Exercises: Stretches   Passive Hamstring Stretch  Both;4 reps;20 seconds    ITB Stretch  Right;4 reps;20 seconds    Piriformis Stretch  Right;4 reps;20 seconds      Knee/Hip Exercises: Machines for Strengthening   Cybex Knee Extension  5# 3x10    Cybex Knee Flexion  25# 3x10      Knee/Hip Exercises: Supine   Other Supine Knee/Hip Exercises  red tband right ankle DF and eversion3x10    Other Supine Knee/Hip Exercises  feet on ball K2C, trunk rotation, small bridges and isometric abs      Moist Heat Therapy  Number Minutes Moist Heat  15 Minutes    Moist Heat Location  Hip      Electrical Stimulation   Electrical Stimulation Location  right low back and buttock area    Electrical Stimulation Action  IFC    Electrical Stimulation Parameters  supine    Electrical Stimulation Goals  Pain               PT Short Term Goals - 10/15/19 0836      PT SHORT TERM GOAL #1   Title  independent with initial HEP    Status  Partially Met        PT Long Term Goals - 09/25/19 1651      PT LONG TERM GOAL #1   Title  increase lumbar ROM 25%    Time  8    Period  Weeks    Status  New      PT LONG TERM GOAL #2   Title  decrease pain 50%    Time  8    Period  Weeks    Status  New      PT LONG TERM GOAL #3   Title  increase right ankle DF to 12 degrees    Time  8    Period  Weeks    Status  New      PT LONG TERM GOAL #4   Title  walk > 15 minutes without pain >4/10    Time  8    Period  Weeks    Status   New            Plan - 10/15/19 0835    Clinical Impression Statement  Patient really has a drop foot that was not obvious the first day, she was in tennis shoes that day and in flip flops today, much more noticeable down incline, she had two times that she needed min A to correct the toe catch and subsequent loss of balance    PT Next Visit Plan  work on the drop foot    Consulted and Agree with Plan of Care  Patient       Patient will benefit from skilled therapeutic intervention in order to improve the following deficits and impairments:  Decreased strength, Decreased range of motion, Improper body mechanics, Pain, Postural dysfunction, Increased muscle spasms, Abnormal gait, Decreased mobility, Decreased activity tolerance, Decreased balance, Difficulty walking, Impaired flexibility, Decreased scar mobility  Visit Diagnosis: Difficulty in walking, not elsewhere classified  Acute right-sided low back pain with right-sided sciatica  Pain in right hip     Problem List Patient Active Problem List   Diagnosis Date Noted  . 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 History of uterine cancer 09/19/2013  . Diverticulosis of colon without hemorrhage 09/19/2013  . Postoperative anemia due to acute blood loss 08/13/2013  . OA (osteoarthritis) of knee 08/12/2013    [name], PT 10/15/2019, 8:37 AM  [place] [address] [place] [address] [place], [place], [address] Phone: [phone]   Fax:  [phone]  Name: Rhonda Gregory MRN: [id number] Date of Birth: 1940/12/14

## 2019-10-17 ENCOUNTER — Ambulatory Visit: Payer: Medicare PPO | Admitting: Physical Therapy

## 2019-10-31 ENCOUNTER — Ambulatory Visit: Payer: Medicare PPO | Attending: Orthopedic Surgery | Admitting: Physical Therapy

## 2019-10-31 ENCOUNTER — Other Ambulatory Visit: Payer: Self-pay

## 2019-10-31 ENCOUNTER — Encounter: Payer: Self-pay | Admitting: Physical Therapy

## 2019-10-31 DIAGNOSIS — M25551 Pain in right hip: Secondary | ICD-10-CM | POA: Diagnosis present

## 2019-10-31 DIAGNOSIS — R262 Difficulty in walking, not elsewhere classified: Secondary | ICD-10-CM | POA: Diagnosis not present

## 2019-10-31 DIAGNOSIS — M5441 Lumbago with sciatica, right side: Secondary | ICD-10-CM | POA: Diagnosis present

## 2019-10-31 NOTE — Therapy (Signed)
[place], [place], [address] Phone: [phone]   Fax:  [phone]  Physical Therapy Treatment  Patient Details  Name: Rhonda Gregory MRN: [id number] Date of Birth: November 22, 1940 Referring Provider (PT): Aluisio   Encounter Date: 10/31/2019  PT End of Session - 10/31/19 0840    Visit Number  3    Number of Visits  12    Date for PT Re-Evaluation  11/25/19    Authorization Type  [employer]    PT Start Time  [phone]    PT Stop Time  0840    PT Time Calculation (min)  47 min    Activity Tolerance  Patient tolerated treatment well    Behavior During Therapy  [facility] for tasks assessed/performed       Past Medical History:  Diagnosis Date  . Arthritis   . Colon polyps   . Diverticulosis   . Gastric ulcer   . Heartburn   . HLD (hyperlipidemia)   . Hypertension   . PONV (postoperative nausea and vomiting)   . Sleep apnea    wears CPAP  . Uterine cancer ([place]) 1995    Past Surgical History:  Procedure Laterality Date  . ABDOMINAL HYSTERECTOMY     with salpingooophorectomy  . CATARACT EXTRACTION, BILATERAL Bilateral 2019  . COLONOSCOPY W/ POLYPECTOMY    . DUPUYTREN CONTRACTURE RELEASE Bilateral   . HEMORRHOID SURGERY    . KNEE ARTHROSCOPY Right   . SUPERFICIAL PERONEAL NERVE RELEASE Right 08/05/2019   Procedure: Right peroneal nerve decompression;  Surgeon: Gaynelle Arabian, MD;  [facility];  Service: Orthopedics;  Laterality: Right;  100mn  . 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   Procedure: RIGHT TOTAL KNEE ARTHROPLASTY;  Surgeon: FGearlean Alf MD;  [facility];  Service: Orthopedics;  Laterality: Right;    There were no vitals filed for this visit.  Subjective Assessment - 10/31/19 0755    Subjective  Patient has again been on vacation the past few weeks, she reports that she has been very busy and has done a lot of activity, reports pain walking on hard surfaces standing    Currently in Pain?  Yes    Pain Score  2     Pain Location  Hip    Pain Orientation  Right;Left    Pain Descriptors / Indicators  Aching;Sore    Aggravating Factors   walking standing on hard surface                        [facility] Adult PT Treatment/Exercise - 10/31/19 0001      Knee/Hip Exercises: Stretches   Passive Hamstring Stretch  Both;20 seconds;5 reps    Quad Stretch  Both;5 reps;20 seconds    ITB Stretch  Both;5 reps;20 seconds    Piriformis Stretch  Both;5 reps;20 seconds    Gastroc Stretch  Both;4 reps;20 seconds      Knee/Hip Exercises: Supine   Other Supine Knee/Hip Exercises  red tband right ankle DF and eversion3x10    Other Supine Knee/Hip Exercises  feet on ball K2C, trunk rotation, small bridges and isometric abs      Manual Therapy   Manual Therapy  Soft tissue mobilization    Soft tissue mobilization  STM to the right buttock and the ITB               PT Short Term Goals - 10/31/19 08338  PT SHORT TERM GOAL #1   Title  independent with initial HEP    Status  Achieved        PT Long Term Goals - 10/31/19 0842      PT LONG TERM GOAL #1   Title  increase lumbar ROM 25%      PT LONG TERM GOAL #2   Title  decrease pain 50%    Status  Partially Met            Plan - 10/31/19 0841    Clinical Impression Statement  Patient much improved flexibility, still very tight with quad.  She did not exhibit foot slap with walking today, still some tenderness in the right buttock, the ITB and the quad.    PT Next Visit Plan  work on the drop foot    Consulted and Agree with Plan of Care  Patient       Patient will benefit from skilled therapeutic intervention in order to improve the following deficits and impairments:  Decreased strength, Decreased range of motion, Improper body mechanics, Pain, Postural dysfunction, Increased muscle spasms, Abnormal gait, Decreased mobility, Decreased activity tolerance, Decreased balance,  Difficulty walking, Impaired flexibility, Decreased scar mobility  Visit Diagnosis: Difficulty in walking, not elsewhere classified  Acute right-sided low back pain with right-sided sciatica  Pain in right hip     Problem List Patient Active Problem List   Diagnosis Date Noted  . 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 History of uterine cancer 09/19/2013  . Diverticulosis of colon without hemorrhage 09/19/2013  . Postoperative anemia due to acute blood loss 08/13/2013  . OA (osteoarthritis) of knee 08/12/2013    [name], PT 10/31/2019, 8:43 AM  [place] [address] [place], [place], [address] Phone: [phone]   Fax:  [phone]  Name: Rhonda Gregory MRN: [id number] Date of Birth: [birth year]

## 2019-11-06 ENCOUNTER — Other Ambulatory Visit: Payer: Self-pay

## 2019-11-06 ENCOUNTER — Ambulatory Visit: Payer: Medicare PPO | Admitting: Physical Therapy

## 2019-11-06 ENCOUNTER — Encounter: Payer: Self-pay | Admitting: Physical Therapy

## 2019-11-06 DIAGNOSIS — M5441 Lumbago with sciatica, right side: Secondary | ICD-10-CM

## 2019-11-06 DIAGNOSIS — R262 Difficulty in walking, not elsewhere classified: Secondary | ICD-10-CM

## 2019-11-06 DIAGNOSIS — M25551 Pain in right hip: Secondary | ICD-10-CM

## 2019-11-06 NOTE — Therapy (Signed)
[place], [place], [address] Phone: [phone]   Fax:  [phone]  Physical Therapy Treatment  Patient Details  Name: Rhonda Gregory MRN: [id number] Date of Birth: Dec 16, 1940 Referring Provider (PT): Aluisio   Encounter Date: 11/06/2019  PT End of Session - 11/06/19 1141    Visit Number  4    Number of Visits  12    Date for PT Re-Evaluation  11/25/19    Authorization Type  [employer]    PT Start Time  1052    PT Stop Time  1140    PT Time Calculation (min)  48 min    Activity Tolerance  Patient tolerated treatment well    Behavior During Therapy  [facility] for tasks assessed/performed       Past Medical History:  Diagnosis Date  . Arthritis   . Colon polyps   . Diverticulosis   . Gastric ulcer   . Heartburn   . HLD (hyperlipidemia)   . Hypertension   . PONV (postoperative nausea and vomiting)   . Sleep apnea    wears CPAP  . Uterine cancer ([place]) 1995    Past Surgical History:  Procedure Laterality Date  . ABDOMINAL HYSTERECTOMY     with salpingooophorectomy  . CATARACT EXTRACTION, BILATERAL Bilateral 2019  . COLONOSCOPY W/ POLYPECTOMY    . DUPUYTREN CONTRACTURE RELEASE Bilateral   . HEMORRHOID SURGERY    . KNEE ARTHROSCOPY Right   . SUPERFICIAL PERONEAL NERVE RELEASE Right 08/05/2019   Procedure: Right peroneal nerve decompression;  Surgeon: Gaynelle Arabian, MD;  [facility];  Service: Orthopedics;  Laterality: Right;  57mn  . 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   Procedure: RIGHT TOTAL KNEE ARTHROPLASTY;  Surgeon: FGearlean Alf MD;  [facility];  Service: Orthopedics;  Laterality: Right;    There were no vitals filed for this visit.  Subjective Assessment - 11/06/19 1139    Subjective  Patient saw the MD he was very pleased with her progress, wants her to continue once a week for the flexibility    Currently in Pain?  Yes    Pain Score  3     Pain Location  Hip     Pain Orientation  Right    Aggravating Factors   yardwork                        [facility] Adult PT Treatment/Exercise - 11/06/19 0001      Knee/Hip Exercises: Stretches   Passive Hamstring Stretch  Both;20 seconds;5 reps    Quad Stretch  Both;5 reps;20 seconds    ITB Stretch  Both;5 reps;20 seconds    Piriformis Stretch  Both;5 reps;20 seconds    Gastroc Stretch  Both;4 reps;20 seconds      Knee/Hip Exercises: Supine   Other Supine Knee/Hip Exercises  feet on ball K2C, trunk rotation, small bridges and isometric abs      Manual Therapy   Manual Therapy  Soft tissue mobilization    Soft tissue mobilization  STM to the right buttock and the ITB, quads               PT Short Term Goals - 10/31/19 05631     PT SHORT TERM GOAL #1   Title  independent with initial HEP    Status  Achieved        PT Long Term Goals - 11/06/19 1142  PT LONG TERM GOAL #1   Title  increase lumbar ROM 25%    Status  Partially Met      PT LONG TERM GOAL #2   Title  decrease pain 50%    Status  Partially Met            Plan - 11/06/19 1141    Clinical Impression Statement  Patient still very tight in the ITB and the right pirifomris, bilateral quads.  She was less tender in the right buttock but very tender in the right ITB    PT Next Visit Plan  work on the drop foot    Consulted and Agree with Plan of Care  Patient       Patient will benefit from skilled therapeutic intervention in order to improve the following deficits and impairments:  Decreased strength, Decreased range of motion, Improper body mechanics, Pain, Postural dysfunction, Increased muscle spasms, Abnormal gait, Decreased mobility, Decreased activity tolerance, Decreased balance, Difficulty walking, Impaired flexibility, Decreased scar mobility  Visit Diagnosis: Difficulty in walking, not elsewhere classified  Acute right-sided low back pain with right-sided sciatica  Pain in right  hip     Problem List Patient Active Problem List   Diagnosis Date Noted  . 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 History of uterine cancer 09/19/2013  . Diverticulosis of colon without hemorrhage 09/19/2013  . Postoperative anemia due to acute blood loss 08/13/2013  . OA (osteoarthritis) of knee 08/12/2013    [name], PT 11/06/2019, 11:43 AM  [place] [address] [place], [place], [address] Phone: [phone]   Fax:  [phone]  Name: Rhonda Gregory MRN: [id number] Date of Birth: Nov 14, 1940

## 2019-11-13 ENCOUNTER — Ambulatory Visit: Payer: Medicare PPO | Admitting: Physical Therapy

## 2019-11-13 ENCOUNTER — Other Ambulatory Visit: Payer: Self-pay

## 2019-11-13 ENCOUNTER — Encounter: Payer: Self-pay | Admitting: Physical Therapy

## 2019-11-13 DIAGNOSIS — R262 Difficulty in walking, not elsewhere classified: Secondary | ICD-10-CM

## 2019-11-13 DIAGNOSIS — M5441 Lumbago with sciatica, right side: Secondary | ICD-10-CM

## 2019-11-13 DIAGNOSIS — M25551 Pain in right hip: Secondary | ICD-10-CM

## 2019-11-13 NOTE — Therapy (Signed)
[place] [address] [place], [place], [address] Phone: [phone]   Fax:  [phone]  Physical Therapy Treatment  Patient Details  Name: Rhonda Gregory MRN: [id number] Date of Birth: Sep 07, 1940 Referring Provider (PT): Aluisio   Encounter Date: 11/13/2019  PT End of Session - 11/13/19 1434    Visit Number  5    Number of Visits  12    Date for PT Re-Evaluation  11/25/19    Authorization Type  [employer]    PT Start Time  4196    PT Stop Time  1441    PT Time Calculation (min)  47 min    Activity Tolerance  Patient tolerated treatment well    Behavior During Therapy  [facility] for tasks assessed/performed       Past Medical History:  Diagnosis Date  . Arthritis   . Colon polyps   . Diverticulosis   . Gastric ulcer   . Heartburn   . HLD (hyperlipidemia)   . Hypertension   . PONV (postoperative nausea and vomiting)   . Sleep apnea    wears CPAP  . Uterine cancer ([place]) 1995    Past Surgical History:  Procedure Laterality Date  . ABDOMINAL HYSTERECTOMY     with salpingooophorectomy  . CATARACT EXTRACTION, BILATERAL Bilateral 2019  . COLONOSCOPY W/ POLYPECTOMY    . DUPUYTREN CONTRACTURE RELEASE Bilateral   . HEMORRHOID SURGERY    . KNEE ARTHROSCOPY Right   . SUPERFICIAL PERONEAL NERVE RELEASE Right 08/05/2019   Procedure: Right peroneal nerve decompression;  Surgeon: Gaynelle Arabian, MD;  Location: [facility];  Service: Orthopedics;  Laterality: Right;  79mn  . 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   Procedure: RIGHT TOTAL KNEE ARTHROPLASTY;  Surgeon: FGearlean Alf MD;  [facility]: [facility];  Service: Orthopedics;  Laterality: Right;    There were no vitals filed for this visit.  Subjective Assessment - 11/13/19 1417    Subjective  PAtient is doing well, she is still struggling with strength of DF and eversion on the right ankle    Currently in Pain?  Yes    Pain Score  3     Pain Location  Hip    Pain  Orientation  Right    Pain Descriptors / Indicators  Sore    Aggravating Factors   activity         [facility] PT Assessment - 11/13/19 0001      AROM   Right Ankle Dorsiflexion  10      Strength   Right Ankle Dorsiflexion  3+/5    Right Ankle Plantar Flexion  3+/5    Right Ankle Eversion  3+/5                    [facility] Adult PT Treatment/Exercise - 11/13/19 0001      Exercises   Exercises  Ankle      Knee/Hip Exercises: Stretches   Passive Hamstring Stretch  Both;20 seconds;5 reps    Quad Stretch  Both;5 reps;20 seconds    ITB Stretch  Both;5 reps;20 seconds    Piriformis Stretch  Both;5 reps;20 seconds    Gastroc Stretch  Both;4 reps;20 seconds      Knee/Hip Exercises: Supine   Other Supine Knee/Hip Exercises  red tband right ankle DF and eversion3x10    Other Supine Knee/Hip Exercises  feet on ball K2C, trunk rotation, small bridges and isometric abs      Manual  Therapy   Manual Therapy  Soft tissue mobilization    Soft tissue mobilization  STM to the right buttock and the ITB, quads      Ankle Exercises: Standing   Heel Walk (Round Trip)  2x a lot of cues needed    Toe Walk (Round Trip)  2x again very weak and needing a lot of cues    Balance Beam  4x      Ankle Exercises: Seated   Other Seated Ankle Exercises  really worked with DF trying manually to limit any HS activity, also did DF eversion and back with 2# weight on top of the foot               PT Short Term Goals - 10/31/19 5364      PT SHORT TERM GOAL #1   Title  independent with initial HEP    Status  Achieved        PT Long Term Goals - 11/13/19 1436      PT LONG TERM GOAL #1   Title  increase lumbar ROM 25%    Status  Partially Met      PT LONG TERM GOAL #2   Title  decrease pain 50%    Status  Partially Met      PT LONG TERM GOAL #3   Title  increase right ankle DF to 12 degrees    Status  Partially Met      PT LONG TERM GOAL #4   Title  walk > 15 minutes without  pain >4/10    Status  On-going            Plan - 11/13/19 1435    Clinical Impression Statement  Patient truly unable to do a toe walk or heel walk due to weakness, she also c/o toe drag with fatigue, I used a lot of hands on to assure that she was using anterior tib and peroneals for ankle motions and not the hip or knee mms.    PT Next Visit Plan  continue to work on the ankle strength and function    Consulted and Agree with Plan of Care  Patient       Patient will benefit from skilled therapeutic intervention in order to improve the following deficits and impairments:  Decreased strength, Decreased range of motion, Improper body mechanics, Pain, Postural dysfunction, Increased muscle spasms, Abnormal gait, Decreased mobility, Decreased activity tolerance, Decreased balance, Difficulty walking, Impaired flexibility, Decreased scar mobility  Visit Diagnosis: Difficulty in walking, not elsewhere classified  Acute right-sided low back pain with right-sided sciatica  Pain in right hip     Problem List Patient Active Problem List   Diagnosis Date Noted  . 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 History of uterine cancer 09/19/2013  . Diverticulosis of colon without hemorrhage 09/19/2013  . Postoperative anemia due to acute blood loss 08/13/2013  . OA (osteoarthritis) of knee 08/12/2013    [name], PT 11/13/2019, 2:37 PM  [place] [address] [place], [place], [address] Phone: [phone]   Fax:  [phone]  Name: Rhonda Gregory MRN: [id number] Date of Birth: [birth year]

## 2019-11-28 ENCOUNTER — Other Ambulatory Visit: Payer: Self-pay

## 2019-11-28 ENCOUNTER — Encounter: Payer: Self-pay | Admitting: Physical Therapy

## 2019-11-28 ENCOUNTER — Ambulatory Visit: Payer: Medicare PPO | Attending: Orthopedic Surgery | Admitting: Physical Therapy

## 2019-11-28 DIAGNOSIS — M25551 Pain in right hip: Secondary | ICD-10-CM | POA: Insufficient documentation

## 2019-11-28 DIAGNOSIS — R262 Difficulty in walking, not elsewhere classified: Secondary | ICD-10-CM | POA: Diagnosis not present

## 2019-11-28 DIAGNOSIS — M5441 Lumbago with sciatica, right side: Secondary | ICD-10-CM

## 2019-11-28 NOTE — Therapy (Signed)
[place] [address] [place], [place], [address] Phone: [phone]   Fax:  [phone]  Physical Therapy Treatment  Patient Details  Name: Rhonda Gregory MRN: [id number] Date of Birth: 1940-08-24 Referring Provider (PT): Aluisio   Encounter Date: 11/28/2019   PT End of Session - 11/28/19 1140    Visit Number 6    Number of Visits 12    Authorization Type [employer]    PT Start Time 1008    PT Stop Time 1110    PT Time Calculation (min) 62 min    Activity Tolerance Patient tolerated treatment well    Behavior During Therapy [facility] for tasks assessed/performed           Past Medical History:  Diagnosis Date  . Arthritis   . Colon polyps   . Diverticulosis   . Gastric ulcer   . Heartburn   . HLD (hyperlipidemia)   . Hypertension   . PONV (postoperative nausea and vomiting)   . Sleep apnea    wears CPAP  . Uterine cancer ([place]) 1995    Past Surgical History:  Procedure Laterality Date  . ABDOMINAL HYSTERECTOMY     with salpingooophorectomy  . CATARACT EXTRACTION, BILATERAL Bilateral 2019  . COLONOSCOPY W/ POLYPECTOMY    . DUPUYTREN CONTRACTURE RELEASE Bilateral   . HEMORRHOID SURGERY    . KNEE ARTHROSCOPY Right   . SUPERFICIAL PERONEAL NERVE RELEASE Right 08/05/2019   Procedure: Right peroneal nerve decompression;  Surgeon: Gaynelle Arabian, MD;  [facility];  Service: Orthopedics;  Laterality: Right;  47mn  . 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   Procedure: RIGHT TOTAL KNEE ARTHROPLASTY;  Surgeon: FGearlean Alf MD;  [facility];  Service: Orthopedics;  Laterality: Right;    There were no vitals filed for this visit.   Subjective Assessment - 11/28/19 1136    Subjective Patient was away for two weeks, she comes in today reporting increased pain and stiffness in the low back and the hip area.  She is unsure if it was not doing her normal routine or riding in the car    Currently in Pain?  Yes    Pain Score 5     Pain Location Hip    Pain Orientation Right;Left    Pain Descriptors / Indicators Spasm;Tightness;Sore    Aggravating Factors  sitting                             [facility] Adult PT Treatment/Exercise - 11/28/19 0001      Exercises   Exercises Knee/Hip      Knee/Hip Exercises: Stretches   Passive Hamstring Stretch Both;20 seconds;5 reps    Quad Stretch Both;5 reps;20 seconds    ITB Stretch Both;5 reps;20 seconds    Piriformis Stretch Both;5 reps;20 seconds    Gastroc Stretch Both;4 reps;20 seconds      Knee/Hip Exercises: Aerobic   Recumbent Bike 5 minutes      Knee/Hip Exercises: Machines for Strengthening   Cybex Leg Press 20# 2x10, 40# x10    Other Machine hip extensin and abduction 5# 2x10      Knee/Hip Exercises: Supine   Other Supine Knee/Hip Exercises feet on ball K2C, trunk rotation, small bridges and isometric abs      Electrical Stimulation   Electrical Stimulation Location bilateral low back and buttocks    Electrical Stimulation Action IFC    Electrical  Stimulation Parameters supine    Electrical Stimulation Goals Pain      Manual Therapy   Manual Therapy Soft tissue mobilization    Soft tissue mobilization STM to the right buttock and the ITB, quads                    PT Short Term Goals - 10/31/19 5638      PT SHORT TERM GOAL #1   Title independent with initial HEP    Status Achieved             PT Long Term Goals - 11/28/19 1142      PT LONG TERM GOAL #1   Title increase lumbar ROM 25%    Status Partially Met      PT LONG TERM GOAL #2   Title decrease pain 50%    Status Partially Met                 Plan - 11/28/19 1141    Clinical Impression Statement Patient gone the past two weeks due to vacation, she is reporting incresae low back and hip pain and tightness, She was a little more tender in the buttocks, the gluteal mms and the SI areas, she was very stiff and tight with ITB  stretches    PT Next Visit Plan continue to work on the ankle strength and function           Patient will benefit from skilled therapeutic intervention in order to improve the following deficits and impairments:  Decreased strength, Decreased range of motion, Improper body mechanics, Pain, Postural dysfunction, Increased muscle spasms, Abnormal gait, Decreased mobility, Decreased activity tolerance, Decreased balance, Difficulty walking, Impaired flexibility, Decreased scar mobility  Visit Diagnosis: Difficulty in walking, not elsewhere classified  Acute right-sided low back pain with right-sided sciatica  Pain in right hip     Problem List Patient Active Problem List   Diagnosis Date Noted  . 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 History of uterine cancer 09/19/2013  . Diverticulosis of colon without hemorrhage 09/19/2013  . Postoperative anemia due to acute blood loss 08/13/2013  . OA (osteoarthritis) of knee 08/12/2013    [name], PT 11/28/2019, 11:47 AM  [place] [address] [place], [place], [address] Phone: [phone]   Fax:  [phone]  Name: Rhonda Gregory MRN: [id number] Date of Birth: 05/24/1941

## 2019-12-11 ENCOUNTER — Ambulatory Visit: Payer: Medicare PPO | Admitting: Physical Therapy

## 2019-12-12 ENCOUNTER — Ambulatory Visit: Payer: Medicare PPO | Admitting: Physical Therapy

## 2019-12-13 IMAGING — NM NUCLEAR MEDICINE THREE PHASE BONE SCAN
10 series · 20 of 20 positions shown · non-contrast
Comparison: None.

CLINICAL DATA: Hx right TKR done 4607. Pain in right knee for past
3 wks.

EXAM:
NUCLEAR MEDICINE 3-PHASE BONE SCAN
TECHNIQUE: Radionuclide angiographic images, immediate static blood pool
images, and 3-hour delayed static images were obtained of the knee
after intravenous injection of radiopharmaceutical.
RADIOPHARMACEUTICALS:  Twenty mCi 8c-44m MDP IV

[Series 1: flow · 2.07mm/px · 6 of 48 frames shown (1 of 2)]
[frame 5/48  full-range]
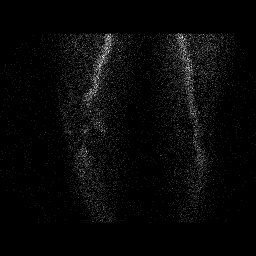
[frame 13/48  full-range]
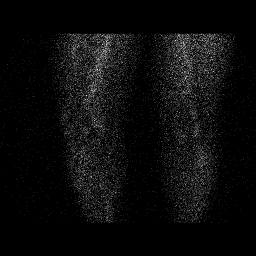
[frame 21/48  full-range]
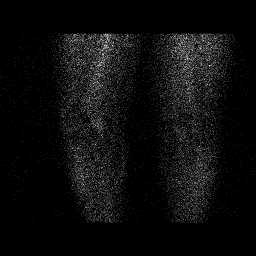
[frame 29/48  full-range]
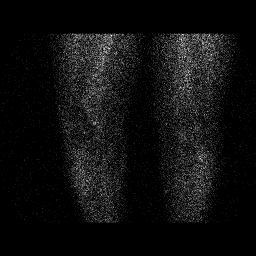
[frame 37/48  full-range]
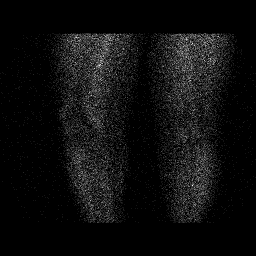
[frame 45/48  full-range]
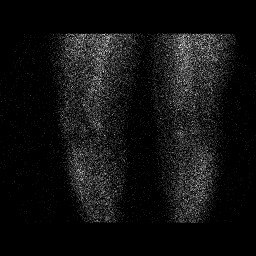

[Series 1: flow · 2.07mm/px · 6 of 48 frames shown (2 of 2)]
[frame 5/48  full-range]
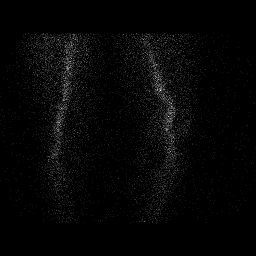
[frame 13/48  full-range]
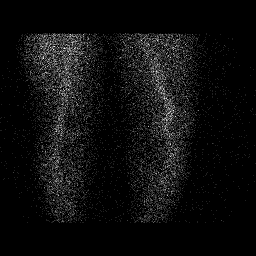
[frame 21/48  full-range]
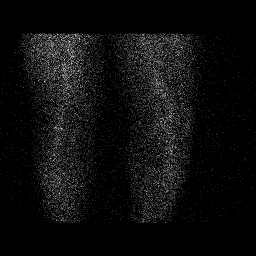
[frame 29/48  full-range]
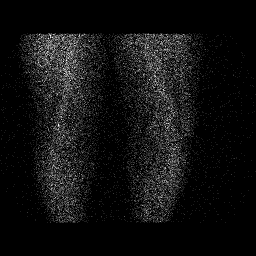
[frame 37/48  full-range]
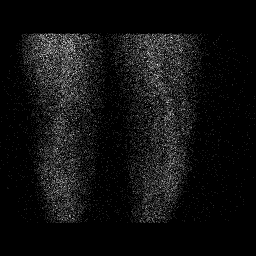
[frame 45/48  full-range]
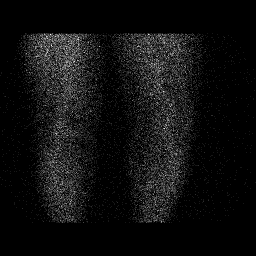

[Series 2: blood pool · 2.07mm/px · 1 of 1 slices shown (1 of 2)]
[im 1/1  full-range]
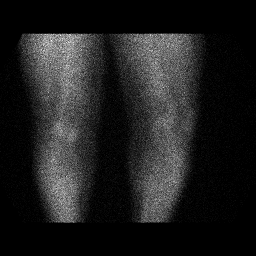

[Series 2: blood pool · 2.07mm/px · 1 of 1 slices shown (2 of 2)]
[im 1/1  full-range]
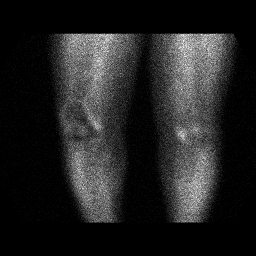

[Series 3: lat bp · 2.07mm/px · 1 of 1 slices shown (1 of 2)]
[im 1/1]
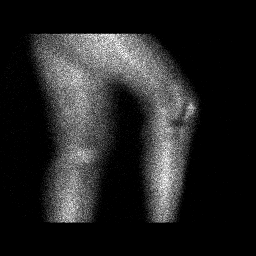

[Series 3: lat bp · 2.07mm/px · 1 of 1 slices shown (2 of 2)]
[im 1/1  full-range]
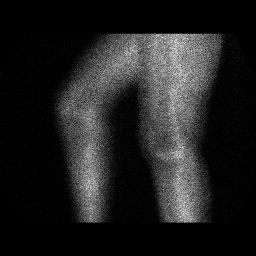

[Series 4: delay · delayed · 2.07mm/px · 1 of 1 slices shown (1 of 4)]
[im 1/1]
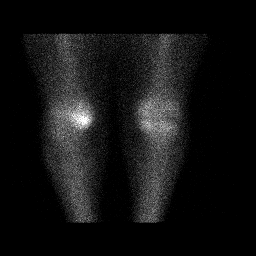

[Series 4: delay · delayed · 2.07mm/px · 1 of 1 slices shown (2 of 4)]
[im 1/1]
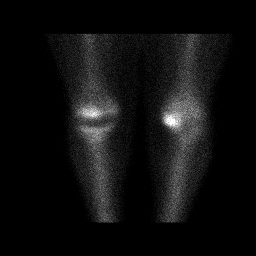

[Series 5: delay · delayed · 2.07mm/px · 1 of 1 slices shown (3 of 4)]
[im 1/1]
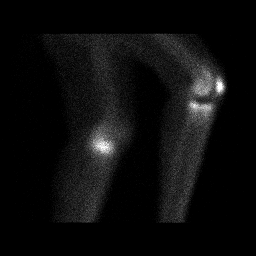

[Series 5: delay · delayed · 2.07mm/px · 1 of 1 slices shown (4 of 4)]
[im 1/1]
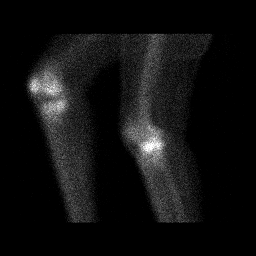

[20 of 20 positions shown; findings below may reference images not displayed]

FINDINGS: Vascular phase: No asymmetric or increased blood flow to the LEFT or
RIGHT knee.

Blood pool phase: No abnormal blood pool activity in the RIGHT knee.
Mild uptake in the medial compartment LEFT knee.

Delayed phase: Minimal periprosthetic uptake in the RIGHT knee.
Intense uptake in the medial compartment LEFT knee.
IMPRESSION: No evidence of loosening or infection of the RIGHT knee prosthetic.

Osteoarthritis of the medial compartment LEFT knee.

## 2019-12-16 DIAGNOSIS — Z961 Presence of intraocular lens: Secondary | ICD-10-CM | POA: Diagnosis not present

## 2019-12-16 DIAGNOSIS — H52203 Unspecified astigmatism, bilateral: Secondary | ICD-10-CM | POA: Diagnosis not present

## 2019-12-16 DIAGNOSIS — H26491 Other secondary cataract, right eye: Secondary | ICD-10-CM | POA: Diagnosis not present

## 2019-12-18 ENCOUNTER — Ambulatory Visit: Payer: Medicare PPO | Admitting: Physical Therapy

## 2019-12-18 ENCOUNTER — Other Ambulatory Visit: Payer: Self-pay

## 2019-12-18 ENCOUNTER — Encounter: Payer: Self-pay | Admitting: Physical Therapy

## 2019-12-18 DIAGNOSIS — R262 Difficulty in walking, not elsewhere classified: Secondary | ICD-10-CM | POA: Diagnosis not present

## 2019-12-18 DIAGNOSIS — M5441 Lumbago with sciatica, right side: Secondary | ICD-10-CM | POA: Diagnosis not present

## 2019-12-18 DIAGNOSIS — M25551 Pain in right hip: Secondary | ICD-10-CM

## 2019-12-18 NOTE — Therapy (Signed)
[place], [place], [address] Phone: [phone]   Fax:  [phone]  Physical Therapy Treatment  Patient Details  Name: Rhonda Gregory MRN: [id number] Date of Birth: [birth year] Referring Provider (PT): Aluisio   Encounter Date: 12/18/2019   PT End of Session - 12/18/19 1723    Visit Number 7    Date for PT Re-Evaluation 12/21/19    Authorization Type [employer]    PT Start Time 2229    PT Stop Time 1715    PT Time Calculation (min) 61 min    Activity Tolerance Patient tolerated treatment well    Behavior During Therapy [facility] for tasks assessed/performed           Past Medical History:  Diagnosis Date  . Arthritis   . Colon polyps   . Diverticulosis   . Gastric ulcer   . Heartburn   . HLD (hyperlipidemia)   . Hypertension   . PONV (postoperative nausea and vomiting)   . Sleep apnea    wears CPAP  . Uterine cancer ([place]) 1995    Past Surgical History:  Procedure Laterality Date  . ABDOMINAL HYSTERECTOMY     with salpingooophorectomy  . CATARACT EXTRACTION, BILATERAL Bilateral 2019  . COLONOSCOPY W/ POLYPECTOMY    . DUPUYTREN CONTRACTURE RELEASE Bilateral   . HEMORRHOID SURGERY    . KNEE ARTHROSCOPY Right   . SUPERFICIAL PERONEAL NERVE RELEASE Right 08/05/2019   Procedure: Right peroneal nerve decompression;  Surgeon: Gaynelle Arabian, MD;  [facility];  Service: Orthopedics;  Laterality: Right;  82mn  . 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   Procedure: RIGHT TOTAL KNEE ARTHROPLASTY;  Surgeon: FGearlean Alf MD;  [facility];  Service: Orthopedics;  Laterality: Right;    There were no vitals filed for this visit.   Subjective Assessment - 12/18/19 1618    Subjective Patient is doing very well with her exercises and reports that she is doing much more activities and exercises and having less pain, she still reports pain in the right buttock and into the right ITB, she  c/o some tightness    Currently in Pain? Yes    Pain Score 4     Pain Location Hip    Pain Orientation Right    Pain Descriptors / Indicators Sore;Tightness    Pain Relieving Factors "ther treatment really helps"                             [facility] Adult PT Treatment/Exercise - 12/18/19 0001      Knee/Hip Exercises: Stretches   Passive Hamstring Stretch Both;20 seconds;5 reps    Quad Stretch Both;5 reps;20 seconds    ITB Stretch Both;5 reps;20 seconds    Piriformis Stretch Both;5 reps;20 seconds    Gastroc Stretch Both;4 reps;20 seconds      Knee/Hip Exercises: Machines for Strengthening   Cybex Leg Press 20# 2x10, 40# x10    Other Machine hip extensin and abduction 5# 2x10      Knee/Hip Exercises: Supine   Other Supine Knee/Hip Exercises red tband right ankle DF and eversion3x10    Other Supine Knee/Hip Exercises feet on ball K2C, trunk rotation, small bridges and isometric abs      Electrical Stimulation   Electrical Stimulation Location bilateral low back and buttocks    Electrical Stimulation Action IFC    Electrical Stimulation Parameters supine  Electrical Stimulation Goals Pain      Manual Therapy   Manual Therapy Soft tissue mobilization    Manual therapy comments PROM of the HS and ITB's    Soft tissue mobilization STM to the right buttock and the ITB, quads                    PT Short Term Goals - 10/31/19 9678      PT SHORT TERM GOAL #1   Title independent with initial HEP    Status Achieved             PT Long Term Goals - 12/18/19 1726      PT LONG TERM GOAL #1   Title increase lumbar ROM 25%    Status Achieved      PT LONG TERM GOAL #2   Title decrease pain 50%    Status Partially Met      PT LONG TERM GOAL #3   Title increase right ankle DF to 12 degrees    Status Partially Met      PT LONG TERM GOAL #4   Title walk > 15 minutes without pain >4/10    Status Partially Met                 Plan -  12/18/19 1723    Clinical Impression Statement Patient is doing great, less issue with drop foot, better ROM of the ankle, still some tightness in the ITB and the glutes, still some right hip pain and tenderness.  She is very busy with travel this summer and will be in and out of the area, I will need to ask for more visits but will ask to see her only limited times over a long period of time she wants to assure that her travel and activity she can do without set backs    PT Next Visit Plan will need to ask [facility] for visits but will ask for a long time frame and few visits    Consulted and Agree with Plan of Care Patient           Patient will benefit from skilled therapeutic intervention in order to improve the following deficits and impairments:  Decreased strength, Decreased range of motion, Improper body mechanics, Pain, Postural dysfunction, Increased muscle spasms, Abnormal gait, Decreased mobility, Decreased activity tolerance, Decreased balance, Difficulty walking, Impaired flexibility, Decreased scar mobility  Visit Diagnosis: Difficulty in walking, not elsewhere classified  Acute right-sided low back pain with right-sided sciatica  Pain in right hip     Problem List Patient Active Problem List   Diagnosis Date Noted  . 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 History of uterine cancer 09/19/2013  . Diverticulosis of colon without hemorrhage 09/19/2013  . Postoperative anemia due to acute blood loss 08/13/2013  . OA (osteoarthritis) of knee 08/12/2013    [name], PT 12/18/2019, 5:26 PM  [place] [address] [place], [place], [address] Phone: [phone]   Fax:  [phone]  Name: Rhonda Gregory MRN: [id number] Date of Birth: 07-28-1940

## 2019-12-24 ENCOUNTER — Encounter: Payer: Medicare PPO | Admitting: Physical Therapy

## 2019-12-30 ENCOUNTER — Ambulatory Visit: Payer: Medicare PPO | Admitting: Physical Therapy

## 2019-12-30 ENCOUNTER — Ambulatory Visit: Payer: Medicare PPO | Attending: Orthopedic Surgery | Admitting: Physical Therapy

## 2019-12-30 ENCOUNTER — Other Ambulatory Visit: Payer: Self-pay

## 2019-12-30 ENCOUNTER — Encounter: Payer: Self-pay | Admitting: Physical Therapy

## 2019-12-30 DIAGNOSIS — M5441 Lumbago with sciatica, right side: Secondary | ICD-10-CM | POA: Diagnosis not present

## 2019-12-30 DIAGNOSIS — M25551 Pain in right hip: Secondary | ICD-10-CM | POA: Diagnosis not present

## 2019-12-30 DIAGNOSIS — R262 Difficulty in walking, not elsewhere classified: Secondary | ICD-10-CM | POA: Diagnosis not present

## 2019-12-30 NOTE — Therapy (Signed)
[place], [place], [address] Phone: [phone]   Fax:  [phone]  Physical Therapy Treatment  Patient Details  Name: Rhonda Gregory MRN: [id number] Date of Birth: 06-29-1940 Referring Provider (PT): Aluisio   Encounter Date: 12/30/2019   PT End of Session - 12/30/19 0838    Visit Number 8    Date for PT Re-Evaluation 02/11/20    Authorization Type [employer]    PT Start Time 0750    PT Stop Time 0841    PT Time Calculation (min) 51 min    Activity Tolerance Patient tolerated treatment well    Behavior During Therapy [facility] for tasks assessed/performed           Past Medical History:  Diagnosis Date  . Arthritis   . Colon polyps   . Diverticulosis   . Gastric ulcer   . Heartburn   . HLD (hyperlipidemia)   . Hypertension   . PONV (postoperative nausea and vomiting)   . Sleep apnea    wears CPAP  . Uterine cancer ([place]) 1995    Past Surgical History:  Procedure Laterality Date  . ABDOMINAL HYSTERECTOMY     with salpingooophorectomy  . CATARACT EXTRACTION, BILATERAL Bilateral 2019  . COLONOSCOPY W/ POLYPECTOMY    . DUPUYTREN CONTRACTURE RELEASE Bilateral   . HEMORRHOID SURGERY    . KNEE ARTHROSCOPY Right   . SUPERFICIAL PERONEAL NERVE RELEASE Right 08/05/2019   Procedure: Right peroneal nerve decompression;  Surgeon: Gaynelle Arabian, MD;  [facility];  Service: Orthopedics;  Laterality: Right;  71mn  . 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   Procedure: RIGHT TOTAL KNEE ARTHROPLASTY;  Surgeon: FGearlean Alf MD;  [facility];  Service: Orthopedics;  Laterality: Right;    There were no vitals filed for this visit.   Subjective Assessment - 12/30/19 0758    Subjective Patient reports that she has some LBP, she continues to c/o some drop foot issues with fatigue    Currently in Pain? Yes    Pain Score 4     Pain Location Back    Pain Orientation Right;Left;Lower     Pain Descriptors / Indicators Aching;Sore    Aggravating Factors  walking                             [facility] Adult PT Treatment/Exercise - 12/30/19 0001      Manual Therapy   Manual Therapy Soft tissue mobilization    Manual therapy comments PROM of the HS and ITB's, piriformis and the quads    Soft tissue mobilization STM to the right buttock and the ITB, quads      Ankle Exercises: Aerobic   Nustep level 5 x 5 minutes      Ankle Exercises: Plyometrics   Plyometric Exercises resisted gait all directions      Ankle Exercises: Stretches   Soleus Stretch 3 reps;20 seconds    Gastroc Stretch 3 reps;20 seconds      Ankle Exercises: Supine   T-Band all right ankle motions red tband 2x15      Ankle Exercises: Standing   SLS working on solid and dynamic surface    Heel Walk (Round Trip) 2x 20' difficult to hold    Toe Walk (Round Trip) 2x20'    Other Standing Ankle Exercises feet on ball K2C, trunk rotation and bridges  PT Education - 12/30/19 [phone]    Education Details Really asked and recommended home stretching for the HS and the piriformis,    Person(s) Educated Patient    Methods Explanation;Demonstration;Tactile cues;Verbal cues;Handout    Comprehension Verbalized understanding            PT Short Term Goals - 10/31/19 8676      PT SHORT TERM GOAL #1   Title independent with initial HEP    Status Achieved             PT Long Term Goals - 12/30/19 0841      PT LONG TERM GOAL #1   Title increase lumbar ROM 25%    Status Achieved      PT LONG TERM GOAL #2   Title decrease pain 50%    Status Partially Met      PT LONG TERM GOAL #3   Title increase right ankle DF to 12 degrees    Status Partially Met      PT LONG TERM GOAL #4   Title walk > 15 minutes without pain >4/10    Status Achieved                 Plan - 12/30/19 0839    Clinical Impression Statement Patient continues to demonstrate less foot  drop, she has issues as the day goes and she gets fatigued, demonstrates incresaed ROM of the right ankle, she is still very tight in the Hs, ITB and piriformis.  She has some tightness and pain in the low back with activities espeically with walking and sitting.  She has been able to do some traveling this summer and will continue to do so.  The hips seems tpo be more of the issue recently with pain and tightness    PT Next Visit Plan will need to ask [facility] for visits but will ask for a long time frame and few visits as she will be traveling    Consulted and Agree with Plan of Care Patient           Patient will benefit from skilled therapeutic intervention in order to improve the following deficits and impairments:  Decreased strength, Decreased range of motion, Improper body mechanics, Pain, Postural dysfunction, Increased muscle spasms, Abnormal gait, Decreased mobility, Decreased activity tolerance, Decreased balance, Difficulty walking, Impaired flexibility, Decreased scar mobility  Visit Diagnosis: Difficulty in walking, not elsewhere classified  Acute right-sided low back pain with right-sided sciatica  Pain in right hip     Problem List Patient Active Problem List   Diagnosis Date Noted  . 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 History of uterine cancer 09/19/2013  . Diverticulosis of colon without hemorrhage 09/19/2013  . Postoperative anemia due to acute blood loss 08/13/2013  . OA (osteoarthritis) of knee 08/12/2013    [name], PT 12/30/2019, 8:42 AM  [place] [address] [place], [place], [address] Phone: [phone]   Fax:  [phone]  Name: Rhonda Gregory MRN: [id number] Date of Birth: Jul 07, 1940

## 2020-01-20 ENCOUNTER — Encounter: Payer: Self-pay | Admitting: Physical Therapy

## 2020-01-21 DIAGNOSIS — H26491 Other secondary cataract, right eye: Secondary | ICD-10-CM | POA: Diagnosis not present

## 2020-01-27 ENCOUNTER — Ambulatory Visit: Payer: Medicare PPO | Attending: Orthopedic Surgery | Admitting: Physical Therapy

## 2020-01-27 ENCOUNTER — Other Ambulatory Visit: Payer: Self-pay

## 2020-01-27 ENCOUNTER — Encounter: Payer: Self-pay | Admitting: Physical Therapy

## 2020-01-27 DIAGNOSIS — R262 Difficulty in walking, not elsewhere classified: Secondary | ICD-10-CM | POA: Diagnosis not present

## 2020-01-27 DIAGNOSIS — M5441 Lumbago with sciatica, right side: Secondary | ICD-10-CM | POA: Diagnosis not present

## 2020-01-27 DIAGNOSIS — M25551 Pain in right hip: Secondary | ICD-10-CM | POA: Diagnosis not present

## 2020-01-27 NOTE — Therapy (Signed)
[place], [place], [address] Phone: [phone]   Fax:  [phone]  Physical Therapy Treatment  Patient Details  Name: Rhonda Gregory MRN: [id number] Date of Birth: Aug 13, 1940 Referring Provider (PT): Aluisio   Encounter Date: 01/27/2020   PT End of Session - 01/27/20 1521    Visit Number 9    Number of Visits 12    Date for PT Re-Evaluation 02/12/20    Authorization Type [employer]    PT Start Time 6384    PT Stop Time 1530    PT Time Calculation (min) 47 min    Activity Tolerance Patient tolerated treatment well    Behavior During Therapy [facility] for tasks assessed/performed           Past Medical History:  Diagnosis Date  . Arthritis   . Colon polyps   . Diverticulosis   . Gastric ulcer   . Heartburn   . HLD (hyperlipidemia)   . Hypertension   . PONV (postoperative nausea and vomiting)   . Sleep apnea    wears CPAP  . Uterine cancer ([place]) 1995    Past Surgical History:  Procedure Laterality Date  . ABDOMINAL HYSTERECTOMY     with salpingooophorectomy  . CATARACT EXTRACTION, BILATERAL Bilateral 2019  . COLONOSCOPY W/ POLYPECTOMY    . DUPUYTREN CONTRACTURE RELEASE Bilateral   . HEMORRHOID SURGERY    . KNEE ARTHROSCOPY Right   . SUPERFICIAL PERONEAL NERVE RELEASE Right 08/05/2019   Procedure: Right peroneal nerve decompression;  Surgeon: Gaynelle Arabian, MD;  Location: [facility];  Service: Orthopedics;  Laterality: Right;  27mn  . 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   Procedure: RIGHT TOTAL KNEE ARTHROPLASTY;  Surgeon: FGearlean Alf MD;  Location: [facility];  Service: Orthopedics;  Laterality: Right;    There were no vitals filed for this visit.   Subjective Assessment - 01/27/20 1444    Subjective Patient has spent some time on vacation, she reports that she did well except on uneven terrain, really noticed a little more right foot catching and stumbling, reports that she really  has to think about the foot for every step while on uneven terrain and when she is fatigued    Currently in Pain? Yes    Pain Score 2     Pain Location Back    Pain Orientation Right    Pain Relieving Factors reports that the treatment and stretches really help                             [facility] Adult PT Treatment/Exercise - 01/27/20 0001      Manual Therapy   Manual Therapy Soft tissue mobilization    Manual therapy comments PROM of the HS and ITB's, piriformis and the quads    Soft tissue mobilization STM to the right buttock and the ITB, quads      Ankle Exercises: Plyometrics   Plyometric Exercises resisted gait all directions      Ankle Exercises: Supine   T-Band all right ankle motions red tband 2x15      Ankle Exercises: Standing   SLS working on solid and dynamic surface    Heel Walk (Round Trip) 2x 20' difficult to hold    Toe Walk (Round Trip) 2x20'    Other Standing Ankle Exercises feet on ball K2C, trunk rotation and bridges  PT Short Term Goals - 10/31/19 9371      PT SHORT TERM GOAL #1   Title independent with initial HEP    Status Achieved             PT Long Term Goals - 01/27/20 1722      PT LONG TERM GOAL #2   Title decrease pain 50%    Status Partially Met      PT LONG TERM GOAL #3   Title increase right ankle DF to 12 degrees    Status Partially Met      PT LONG TERM GOAL #4   Title walk > 15 minutes without pain >4/10    Status Achieved                 Plan - 01/27/20 1521    Clinical Impression Statement Patient really doing better, biggest issue is her on uneven "rocky" terrain and when she fatigues, she tends to have some drop foot issues, she also has some pain in the right buttock, back and ITB area.  She is doing a lot of travelling so I am seeing her sporadically and addressing issues as they aise from her trips    PT Next Visit Plan see when she is back, continue to push the  stength and endurance    Consulted and Agree with Plan of Care Patient           Patient will benefit from skilled therapeutic intervention in order to improve the following deficits and impairments:  Decreased strength, Decreased range of motion, Improper body mechanics, Pain, Postural dysfunction, Increased muscle spasms, Abnormal gait, Decreased mobility, Decreased activity tolerance, Decreased balance, Difficulty walking, Impaired flexibility, Decreased scar mobility  Visit Diagnosis: Difficulty in walking, not elsewhere classified  Acute right-sided low back pain with right-sided sciatica  Pain in right hip     Problem List Patient Active Problem List   Diagnosis Date Noted  . 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 History of uterine cancer 09/19/2013  . Diverticulosis of colon without hemorrhage 09/19/2013  . Postoperative anemia due to acute blood loss 08/13/2013  . OA (osteoarthritis) of knee 08/12/2013    [name], PT 01/27/2020, 5:23 PM  [place] [address] [place], [place], [address] Phone: [phone]   Fax:  [phone]  Name: Rhonda Gregory MRN: [id number] Date of Birth: 12/23/1940

## 2020-02-03 ENCOUNTER — Encounter: Payer: Self-pay | Admitting: Physical Therapy

## 2020-02-09 DIAGNOSIS — Z20828 Contact with and (suspected) exposure to other viral communicable diseases: Secondary | ICD-10-CM | POA: Diagnosis not present

## 2020-02-11 ENCOUNTER — Ambulatory Visit: Payer: Medicare PPO | Attending: Internal Medicine

## 2020-02-11 ENCOUNTER — Encounter: Payer: Self-pay | Admitting: Physical Therapy

## 2020-02-11 ENCOUNTER — Ambulatory Visit: Payer: Medicare PPO | Admitting: Physical Therapy

## 2020-02-11 ENCOUNTER — Other Ambulatory Visit: Payer: Self-pay

## 2020-02-11 DIAGNOSIS — M25551 Pain in right hip: Secondary | ICD-10-CM

## 2020-02-11 DIAGNOSIS — Z23 Encounter for immunization: Secondary | ICD-10-CM

## 2020-02-11 DIAGNOSIS — M5441 Lumbago with sciatica, right side: Secondary | ICD-10-CM | POA: Diagnosis not present

## 2020-02-11 DIAGNOSIS — R262 Difficulty in walking, not elsewhere classified: Secondary | ICD-10-CM | POA: Diagnosis not present

## 2020-02-11 NOTE — Therapy (Signed)
[place] [address] [place], [place], [address] Phone: [phone]   Fax:  [phone] Progress Note Reporting Period 09/25/19 to 02/11/20 for the first 10 visits  See note below for Objective Data and Assessment of Progress/Goals.      Physical Therapy Treatment  Patient Details  Name: Rhonda Gregory MRN: [id number] Date of Birth: [birth year] Referring Provider (PT): Aluisio   Encounter Date: 02/11/2020   PT End of Session - 02/11/20 0834    Visit Number 10    Date for PT Re-Evaluation 02/12/20    Authorization Type [employer]    PT Start Time 0753    PT Stop Time 0840    PT Time Calculation (min) 47 min    Activity Tolerance Patient tolerated treatment well    Behavior During Therapy [facility] for tasks assessed/performed           Past Medical History:  Diagnosis Date  . Arthritis   . Colon polyps   . Diverticulosis   . Gastric ulcer   . Heartburn   . HLD (hyperlipidemia)   . Hypertension   . PONV (postoperative nausea and vomiting)   . Sleep apnea    wears CPAP  . Uterine cancer ([place]) 1995    Past Surgical History:  Procedure Laterality Date  . ABDOMINAL HYSTERECTOMY     with salpingooophorectomy  . CATARACT EXTRACTION, BILATERAL Bilateral 2019  . COLONOSCOPY W/ POLYPECTOMY    . DUPUYTREN CONTRACTURE RELEASE Bilateral   . HEMORRHOID SURGERY    . KNEE ARTHROSCOPY Right   . SUPERFICIAL PERONEAL NERVE RELEASE Right 08/05/2019   Procedure: Right peroneal nerve decompression;  Surgeon: Gaynelle Arabian, MD;  [facility];  Service: Orthopedics;  Laterality: Right;  58min  . 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 Yes    Pain Score 4     Pain Location Back    Pain Orientation Right    Pain Descriptors / Indicators Tightness;Sore;Aching    Aggravating Factors  walking, standing                             [facility] Adult PT Treatment/Exercise - 02/11/20 0001      Knee/Hip Exercises: Aerobic   Recumbent Bike 5 minutes      Knee/Hip Exercises: Machines for Strengthening   Cybex Knee Extension 5# 3x10    Other Machine hip extensin and abduction 5# 2x10      Manual Therapy   Manual Therapy Soft tissue mobilization    Manual therapy comments PROM of the HS and ITB's, piriformis and the quads    Soft tissue mobilization STM to the right buttock and the ITB, quads      Ankle Exercises: Plyometrics   Plyometric Exercises resisted gait all directions      Ankle Exercises: Supine  T-Band all right ankle motions red tband 2x15      Ankle Exercises: Standing   Heel Walk (Round Trip) 2x 20' difficult to hold    Toe Walk (Round Trip) 2x20'    Other Standing Ankle Exercises feet on ball K2C, trunk rotation and bridges      Ankle Exercises: Stretches   Soleus Stretch 3 reps;20 seconds    Gastroc Stretch 3 reps;20 seconds                    PT Short Term Goals - 10/31/19 8841      PT SHORT TERM GOAL #1   Title independent with initial HEP    Status Achieved             PT Long Term Goals - 02/11/20 0836      PT LONG TERM GOAL #1   Title increase lumbar ROM 25%    Status Achieved      PT LONG TERM GOAL #2   Title decrease pain 50%    Status Partially Met      PT LONG TERM GOAL #3   Title increase right ankle DF to 12 degrees    Status Achieved      PT LONG TERM GOAL #4   Title walk > 15 minutes without pain >4/10     Status Achieved                 Plan - 02/11/20 0835    Clinical Impression Statement Patient with a little more soreness in the buttocks and thighs after baby sitting this weekend.  She reports only a few instances of the drop foot issue.  She will be on vacation the next few weeks so we went over what she can do for her HEP and how to do.  Cautioned her about fatigued and the drop foot increaseing risk for falls.    PT Next Visit Plan she will be on vacation the next few weeks and is to test out how she does without PT and on her own with some increased activity    Consulted and Agree with Plan of Care Patient           Patient will benefit from skilled therapeutic intervention in order to improve the following deficits and impairments:  Decreased strength, Decreased range of motion, Improper body mechanics, Pain, Postural dysfunction, Increased muscle spasms, Abnormal gait, Decreased mobility, Decreased activity tolerance, Decreased balance, Difficulty walking, Impaired flexibility, Decreased scar mobility  Visit Diagnosis: Difficulty in walking, not elsewhere classified  Acute right-sided low back pain with right-sided sciatica  Pain in right hip     Problem List Patient Active Problem List   Diagnosis Date Noted  . 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 History of uterine cancer 09/19/2013  . Diverticulosis of colon without hemorrhage 09/19/2013  . Postoperative anemia due to acute blood loss 08/13/2013  . OA (osteoarthritis) of knee 08/12/2013    [name], PT 02/11/2020, 8:38 AM  [place] [address] [facility] [place], [place], [address] Phone: [phone]   Fax:  [phone]  Name: Rhonda Gregory MRN: [id number] Date of Birth: 1940-09-19

## 2020-02-11 NOTE — Progress Notes (Signed)
° °  Covid-19 Vaccination Clinic  Name:  Rhonda Gregory    MRN: [id number] DOB: [birth year]  02/11/2020  Rhonda Gregory was observed post Covid-19 immunization for 15 minutes without incident. She was provided with Vaccine Information Sheet and instruction to access the V-Safe system.   Rhonda Gregory was instructed to call 911 with any severe reactions post vaccine:  Difficulty breathing   Swelling of face and throat   A fast heartbeat   A bad rash all over body   Dizziness and weakness   Immunizations Administered    Name Date Dose VIS Date Route   Pfizer COVID-19 Vaccine 02/11/2020 10:06 AM 0.3 mL 08/14/2018 Intramuscular   Manufacturer: [place]   Lot: [place]: [id number]

## 2020-06-04 DIAGNOSIS — M47816 Spondylosis without myelopathy or radiculopathy, lumbar region: Secondary | ICD-10-CM | POA: Diagnosis not present

## 2020-07-31 DIAGNOSIS — G4733 Obstructive sleep apnea (adult) (pediatric): Secondary | ICD-10-CM | POA: Diagnosis not present

## 2020-08-04 ENCOUNTER — Encounter: Payer: Self-pay | Admitting: Internal Medicine

## 2020-08-11 DIAGNOSIS — D225 Melanocytic nevi of trunk: Secondary | ICD-10-CM | POA: Diagnosis not present

## 2020-08-11 DIAGNOSIS — X32XXXD Exposure to sunlight, subsequent encounter: Secondary | ICD-10-CM | POA: Diagnosis not present

## 2020-08-11 DIAGNOSIS — L820 Inflamed seborrheic keratosis: Secondary | ICD-10-CM | POA: Diagnosis not present

## 2020-08-11 DIAGNOSIS — C4441 Basal cell carcinoma of skin of scalp and neck: Secondary | ICD-10-CM | POA: Diagnosis not present

## 2020-08-11 DIAGNOSIS — L570 Actinic keratosis: Secondary | ICD-10-CM | POA: Diagnosis not present

## 2020-08-11 DIAGNOSIS — Z1283 Encounter for screening for malignant neoplasm of skin: Secondary | ICD-10-CM | POA: Diagnosis not present

## 2020-09-09 DIAGNOSIS — Z85828 Personal history of other malignant neoplasm of skin: Secondary | ICD-10-CM | POA: Diagnosis not present

## 2020-09-09 DIAGNOSIS — L570 Actinic keratosis: Secondary | ICD-10-CM | POA: Diagnosis not present

## 2020-09-09 DIAGNOSIS — Z08 Encounter for follow-up examination after completed treatment for malignant neoplasm: Secondary | ICD-10-CM | POA: Diagnosis not present

## 2020-09-09 DIAGNOSIS — X32XXXD Exposure to sunlight, subsequent encounter: Secondary | ICD-10-CM | POA: Diagnosis not present

## 2020-09-21 DIAGNOSIS — Z803 Family history of malignant neoplasm of breast: Secondary | ICD-10-CM | POA: Diagnosis not present

## 2020-09-21 DIAGNOSIS — Z1231 Encounter for screening mammogram for malignant neoplasm of breast: Secondary | ICD-10-CM | POA: Diagnosis not present

## 2020-10-21 DIAGNOSIS — E785 Hyperlipidemia, unspecified: Secondary | ICD-10-CM | POA: Diagnosis not present

## 2020-10-21 DIAGNOSIS — I10 Essential (primary) hypertension: Secondary | ICD-10-CM | POA: Diagnosis not present

## 2020-10-21 DIAGNOSIS — R739 Hyperglycemia, unspecified: Secondary | ICD-10-CM | POA: Diagnosis not present

## 2020-10-22 DIAGNOSIS — M25561 Pain in right knee: Secondary | ICD-10-CM | POA: Diagnosis not present

## 2020-10-22 DIAGNOSIS — M1712 Unilateral primary osteoarthritis, left knee: Secondary | ICD-10-CM | POA: Diagnosis not present

## 2020-10-27 DIAGNOSIS — Z1389 Encounter for screening for other disorder: Secondary | ICD-10-CM | POA: Diagnosis not present

## 2020-10-27 DIAGNOSIS — Z1212 Encounter for screening for malignant neoplasm of rectum: Secondary | ICD-10-CM | POA: Diagnosis not present

## 2020-10-27 DIAGNOSIS — Z1331 Encounter for screening for depression: Secondary | ICD-10-CM | POA: Diagnosis not present

## 2020-10-27 DIAGNOSIS — M1990 Unspecified osteoarthritis, unspecified site: Secondary | ICD-10-CM | POA: Diagnosis not present

## 2020-10-27 DIAGNOSIS — E785 Hyperlipidemia, unspecified: Secondary | ICD-10-CM | POA: Diagnosis not present

## 2020-10-27 DIAGNOSIS — I10 Essential (primary) hypertension: Secondary | ICD-10-CM | POA: Diagnosis not present

## 2020-10-27 DIAGNOSIS — R82998 Other abnormal findings in urine: Secondary | ICD-10-CM | POA: Diagnosis not present

## 2020-10-27 DIAGNOSIS — Z0000 Encounter for general adult medical examination without abnormal findings: Secondary | ICD-10-CM | POA: Diagnosis not present

## 2020-10-27 DIAGNOSIS — R739 Hyperglycemia, unspecified: Secondary | ICD-10-CM | POA: Diagnosis not present

## 2020-10-27 DIAGNOSIS — G4733 Obstructive sleep apnea (adult) (pediatric): Secondary | ICD-10-CM | POA: Diagnosis not present

## 2020-10-27 DIAGNOSIS — M4712 Other spondylosis with myelopathy, cervical region: Secondary | ICD-10-CM | POA: Diagnosis not present

## 2020-11-25 DIAGNOSIS — M1712 Unilateral primary osteoarthritis, left knee: Secondary | ICD-10-CM | POA: Diagnosis not present

## 2020-12-01 DIAGNOSIS — M1712 Unilateral primary osteoarthritis, left knee: Secondary | ICD-10-CM | POA: Diagnosis not present

## 2020-12-09 DIAGNOSIS — M1712 Unilateral primary osteoarthritis, left knee: Secondary | ICD-10-CM | POA: Diagnosis not present

## 2020-12-22 DIAGNOSIS — G4733 Obstructive sleep apnea (adult) (pediatric): Secondary | ICD-10-CM | POA: Diagnosis not present

## 2021-01-21 DIAGNOSIS — M1712 Unilateral primary osteoarthritis, left knee: Secondary | ICD-10-CM | POA: Diagnosis not present

## 2021-03-09 DIAGNOSIS — G8929 Other chronic pain: Secondary | ICD-10-CM | POA: Diagnosis not present

## 2021-03-09 DIAGNOSIS — G629 Polyneuropathy, unspecified: Secondary | ICD-10-CM | POA: Diagnosis not present

## 2021-03-09 DIAGNOSIS — Z791 Long term (current) use of non-steroidal anti-inflammatories (NSAID): Secondary | ICD-10-CM | POA: Diagnosis not present

## 2021-03-09 DIAGNOSIS — G4733 Obstructive sleep apnea (adult) (pediatric): Secondary | ICD-10-CM | POA: Diagnosis not present

## 2021-03-09 DIAGNOSIS — M1990 Unspecified osteoarthritis, unspecified site: Secondary | ICD-10-CM | POA: Diagnosis not present

## 2021-03-09 DIAGNOSIS — I739 Peripheral vascular disease, unspecified: Secondary | ICD-10-CM | POA: Diagnosis not present

## 2021-03-09 DIAGNOSIS — M4800 Spinal stenosis, site unspecified: Secondary | ICD-10-CM | POA: Diagnosis not present

## 2021-03-09 DIAGNOSIS — I10 Essential (primary) hypertension: Secondary | ICD-10-CM | POA: Diagnosis not present

## 2021-03-09 DIAGNOSIS — E785 Hyperlipidemia, unspecified: Secondary | ICD-10-CM | POA: Diagnosis not present

## 2021-05-18 ENCOUNTER — Encounter: Payer: Self-pay | Admitting: Internal Medicine

## 2021-05-18 ENCOUNTER — Ambulatory Visit: Payer: Medicare PPO | Admitting: Internal Medicine

## 2021-05-18 VITALS — BP 112/74 | HR 82 | Ht 61.0 in | Wt 163.1 lb

## 2021-05-18 DIAGNOSIS — Z800 Family history of malignant neoplasm of digestive organs: Secondary | ICD-10-CM

## 2021-05-18 DIAGNOSIS — Z86010 Personal history of colonic polyps: Secondary | ICD-10-CM

## 2021-05-18 NOTE — Patient Instructions (Signed)
Follow - up as needed.   If you are age 80 or older, your body mass index should be between 23-30. Your Body mass index is 30.82 kg/m. If this is out of the aforementioned range listed, please consider follow up with your Primary Care Provider.  The [facility] providers would like to encourage you to use [facility] to communicate with providers for non-urgent requests or questions.  Due to long hold times on the telephone, sending your provider a message by [facility] may be a faster and more efficient way to get a response.  Please allow 48 business hours for a response.  Please remember that this is for non-urgent requests.  _______________________________________________________  Thank you for choosing me and [facility].  Dr. Hilarie Fredrickson

## 2021-05-18 NOTE — Progress Notes (Signed)
Patient ID: Rhonda Gregory, female   DOB: 03-Jul-1940, 80 y.o.   MRN: [id number] HPI: Rhonda Gregory is an 80 year old female with a remote history of adenomatous colon polyps, family history of colon cancer in her mother, intermittent GERD, colonic diverticulosis who is here to discuss screening/surveillance colonoscopy.  She also has a history of hypertension and hyperlipidemia and sleep apnea.  Remote uterine cancer.  She reports that she is feeling well.  She is having regular bowel movements.  She is careful with her diet.  She exercises daily.  No blood in stool or melena.  No abdominal pain.  No issues with frequent heartburn, dysphagia or odynophagia.  She will occasionally feel heartburn symptoms if she eats too quickly.  No unexplained weight loss, early satiety.  Her last colonoscopy was performed by me on 06/25/2015.  This showed mild sigmoid diverticulosis and was otherwise normal.  Past Medical History:  Diagnosis Date   Arthritis    Colon polyps    Diverticulosis    Gastric ulcer    Heartburn    HLD (hyperlipidemia)    Hypertension    PONV (postoperative nausea and vomiting)    Sleep apnea    wears CPAP   Uterine cancer ([place]) 1995    Past Surgical History:  Procedure Laterality Date   ABDOMINAL HYSTERECTOMY     with salpingooophorectomy   CATARACT EXTRACTION, BILATERAL Bilateral 2019   COLONOSCOPY W/ POLYPECTOMY     DUPUYTREN CONTRACTURE RELEASE Bilateral    HEMORRHOID SURGERY     KNEE ARTHROSCOPY Right    SUPERFICIAL PERONEAL NERVE RELEASE Right 08/05/2019   Procedure: Right peroneal nerve decompression;  Surgeon: Gaynelle Arabian, MD;  [facility];  Service: Orthopedics;  Laterality: Right;  44min   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   Procedure: RIGHT TOTAL KNEE ARTHROPLASTY;  Surgeon: [name], MD;  [facility]: [facility];  Service: Orthopedics;  Laterality: Right;    Outpatient Medications Prior to Visit  Medication Sig Dispense Refill   acetaminophen  (TYLENOL) 650 MG CR tablet Take 1,300 mg by mouth every 8 (eight) hours as needed for pain.     augmented betamethasone dipropionate (DIPROLENE-AF) 0.05 % ointment Apply 1 application topically 2 (two) times daily as needed (skin irritation/rash).      cyclobenzaprine (FLEXERIL) 5 MG tablet Take 5 mg by mouth 3 (three) times daily as needed for spasms.     gabapentin (NEURONTIN) 300 MG capsule Take 300 mg by mouth 3 (three) times daily.     meclizine (ANTIVERT) 25 MG tablet Take 1 tablet (25 mg total) by mouth 3 (three) times daily as needed for dizziness. 21 tablet 0   methocarbamol (ROBAXIN) 500 MG tablet Take 500 mg by mouth 2 (two) times daily as needed for muscle spasms.     naproxen (NAPROSYN) 500 MG tablet Take 500 mg by mouth 2 (two) times daily as needed for pain.     olmesartan-hydrochlorothiazide (BENICAR HCT) 40-25 MG tablet Take 1 tablet by mouth daily.     ondansetron (ZOFRAN ODT) 8 MG disintegrating tablet Take 1 tablet (8 mg total) by mouth every 8 (eight) hours as needed for nausea or vomiting. 12 tablet 0   simvastatin (ZOCOR) 20 MG tablet Take 20 mg by mouth at bedtime.      traMADol (ULTRAM) 50 MG tablet Take 50 mg by mouth 3 (three) times daily as needed for pain.     diclofenac Sodium (VOLTAREN) 1 % GEL Apply 1 application topically 4 (four) times  daily as needed (leg pain.).     No facility-administered medications prior to visit.    Allergies  Allergen Reactions   Codeine Nausea Only   Dilaudid [Hydromorphone Hcl] Nausea Only and Other (See Comments)    Abdominal pains    Family History  Problem Relation Age of Onset   Breast cancer Mother    Colon cancer Mother    Heart attack Father 56   Diabetes Father    Esophageal cancer Neg Hx    Stomach cancer Neg Hx    Rectal cancer Neg Hx     Social History   Tobacco Use   Smoking status: Former    Packs/day: 1.00    Years: 4.00    Pack years: 4.00    Types: Cigarettes    Quit date: 08/08/1960    Years since  quitting: 60.8   Smokeless tobacco: Never  Vaping Use   Vaping Use: Never used  Substance Use Topics   Alcohol use: Not Currently    Comment: 1-2 glasses wine weekly- nothing in the last 3 months   Drug use: No    ROS: As per history of present illness, otherwise negative  BP 112/74   Pulse 82   Ht 5\' 1"  (1.549 m)   Wt 163 lb 2 oz (74 kg)   BMI 30.82 kg/m  Constitutional: Well-developed and well-nourished. No distress. Gen: awake, alert, NAD HEENT: anicteric, op clear CV: RRR, no mrg Pulm: CTA b/l Abd: soft, NT/ND, +BS throughout Ext: no c/c/e Neuro: nonfocal   RELEVANT LABS AND IMAGING: CBC    Component Value Date/Time   WBC 6.9 07/31/2019 0957   RBC 4.52 07/31/2019 0957   HGB 13.4 07/31/2019 0957   HCT 42.2 07/31/2019 0957   PLT 247 07/31/2019 0957   MCV 93.4 07/31/2019 0957   MCH 29.6 07/31/2019 0957   MCHC 31.8 07/31/2019 0957   RDW 12.1 07/31/2019 0957   LYMPHSABS 3.6 07/24/2018 1310   MONOABS 0.8 07/24/2018 1310   EOSABS 0.1 07/24/2018 1310   BASOSABS 0.0 07/24/2018 1310    CMP     Component Value Date/Time   NA 138 07/31/2019 0957   K 4.5 07/31/2019 0957   CL 103 07/31/2019 0957   CO2 28 07/31/2019 0957   GLUCOSE 99 07/31/2019 0957   BUN 31 (H) 07/31/2019 0957   CREATININE 0.63 07/31/2019 0957   CALCIUM 9.6 07/31/2019 0957   PROT 6.8 07/24/2018 1310   ALBUMIN 3.9 07/24/2018 1310   AST 23 07/24/2018 1310   ALT 23 07/24/2018 1310   ALKPHOS 73 07/24/2018 1310   BILITOT 0.4 07/24/2018 1310   GFRNONAA >60 07/31/2019 0957   GFRAA >60 07/31/2019 0957    ASSESSMENT/PLAN: 80 year old female with a remote history of adenomatous colon polyps, family history of colon cancer in her mother, intermittent GERD, colonic diverticulosis who is here to discuss screening/surveillance colonoscopy.   Remote history of adenomatous colon polyp (2003)/family history of colon cancer in patient's mother --we discussed surveillance colonoscopy at this time.  She is  not interested in continuing screening and surveillance based on age.  She also is feeling very well and has no specific GI complaint.  We discussed current guidelines, screening and surveillance for colorectal cancer based on personal and family history.  Given that she is age 47 and had a normal colonoscopy 5+ years ago, I do feel that she is low risk for advanced colon polyps and malignancy.  After thorough discussion and in light of her opinion  to discontinue screening and surveillance we will not schedule colonoscopy at this time.  She understands that should troublesome GI symptoms occur in the future we would evaluate that fully.  She is happy with the plan --Discontinue screening and surveillance colonoscopy based on age --Return as needed      NG:FREVQ, Jenny Reichmann, [place],  [place] [id number]

## 2021-08-17 DIAGNOSIS — H0100A Unspecified blepharitis right eye, upper and lower eyelids: Secondary | ICD-10-CM | POA: Diagnosis not present

## 2021-08-17 DIAGNOSIS — H10411 Chronic giant papillary conjunctivitis, right eye: Secondary | ICD-10-CM | POA: Diagnosis not present

## 2021-08-17 DIAGNOSIS — H0100B Unspecified blepharitis left eye, upper and lower eyelids: Secondary | ICD-10-CM | POA: Diagnosis not present

## 2021-08-17 DIAGNOSIS — H04123 Dry eye syndrome of bilateral lacrimal glands: Secondary | ICD-10-CM | POA: Diagnosis not present

## 2021-10-19 DIAGNOSIS — M47816 Spondylosis without myelopathy or radiculopathy, lumbar region: Secondary | ICD-10-CM | POA: Diagnosis not present

## 2021-11-03 DIAGNOSIS — E785 Hyperlipidemia, unspecified: Secondary | ICD-10-CM | POA: Diagnosis not present

## 2021-11-03 DIAGNOSIS — R739 Hyperglycemia, unspecified: Secondary | ICD-10-CM | POA: Diagnosis not present

## 2021-11-03 DIAGNOSIS — I10 Essential (primary) hypertension: Secondary | ICD-10-CM | POA: Diagnosis not present

## 2021-11-09 DIAGNOSIS — R82998 Other abnormal findings in urine: Secondary | ICD-10-CM | POA: Diagnosis not present

## 2021-11-09 DIAGNOSIS — I10 Essential (primary) hypertension: Secondary | ICD-10-CM | POA: Diagnosis not present

## 2021-11-09 DIAGNOSIS — E785 Hyperlipidemia, unspecified: Secondary | ICD-10-CM | POA: Diagnosis not present

## 2021-11-09 DIAGNOSIS — M4712 Other spondylosis with myelopathy, cervical region: Secondary | ICD-10-CM | POA: Diagnosis not present

## 2021-11-09 DIAGNOSIS — Z0000 Encounter for general adult medical examination without abnormal findings: Secondary | ICD-10-CM | POA: Diagnosis not present

## 2021-11-09 DIAGNOSIS — Z1331 Encounter for screening for depression: Secondary | ICD-10-CM | POA: Diagnosis not present

## 2021-11-09 DIAGNOSIS — R739 Hyperglycemia, unspecified: Secondary | ICD-10-CM | POA: Diagnosis not present

## 2021-11-09 DIAGNOSIS — M5459 Other low back pain: Secondary | ICD-10-CM | POA: Diagnosis not present

## 2021-11-09 DIAGNOSIS — Z1231 Encounter for screening mammogram for malignant neoplasm of breast: Secondary | ICD-10-CM | POA: Diagnosis not present

## 2021-11-09 DIAGNOSIS — M1990 Unspecified osteoarthritis, unspecified site: Secondary | ICD-10-CM | POA: Diagnosis not present

## 2021-11-09 DIAGNOSIS — L299 Pruritus, unspecified: Secondary | ICD-10-CM | POA: Diagnosis not present

## 2021-11-11 DIAGNOSIS — M47816 Spondylosis without myelopathy or radiculopathy, lumbar region: Secondary | ICD-10-CM | POA: Diagnosis not present

## 2022-02-09 DIAGNOSIS — Z811 Family history of alcohol abuse and dependence: Secondary | ICD-10-CM | POA: Diagnosis not present

## 2022-02-09 DIAGNOSIS — Z8249 Family history of ischemic heart disease and other diseases of the circulatory system: Secondary | ICD-10-CM | POA: Diagnosis not present

## 2022-02-09 DIAGNOSIS — M1990 Unspecified osteoarthritis, unspecified site: Secondary | ICD-10-CM | POA: Diagnosis not present

## 2022-02-09 DIAGNOSIS — E785 Hyperlipidemia, unspecified: Secondary | ICD-10-CM | POA: Diagnosis not present

## 2022-02-09 DIAGNOSIS — Z809 Family history of malignant neoplasm, unspecified: Secondary | ICD-10-CM | POA: Diagnosis not present

## 2022-02-09 DIAGNOSIS — I10 Essential (primary) hypertension: Secondary | ICD-10-CM | POA: Diagnosis not present

## 2022-02-09 DIAGNOSIS — G629 Polyneuropathy, unspecified: Secondary | ICD-10-CM | POA: Diagnosis not present

## 2022-04-04 ENCOUNTER — Ambulatory Visit (INDEPENDENT_AMBULATORY_CARE_PROVIDER_SITE_OTHER): Payer: Medicare PPO | Admitting: Neurology

## 2022-04-04 ENCOUNTER — Encounter: Payer: Self-pay | Admitting: Neurology

## 2022-04-04 ENCOUNTER — Encounter: Payer: Self-pay | Admitting: *Deleted

## 2022-04-04 VITALS — BP 128/77 | HR 66 | Ht 60.0 in | Wt 160.0 lb

## 2022-04-04 DIAGNOSIS — Z1283 Encounter for screening for malignant neoplasm of skin: Secondary | ICD-10-CM | POA: Diagnosis not present

## 2022-04-04 DIAGNOSIS — Z820 Family history of epilepsy and other diseases of the nervous system: Secondary | ICD-10-CM

## 2022-04-04 DIAGNOSIS — G4733 Obstructive sleep apnea (adult) (pediatric): Secondary | ICD-10-CM | POA: Diagnosis not present

## 2022-04-04 DIAGNOSIS — L821 Other seborrheic keratosis: Secondary | ICD-10-CM | POA: Diagnosis not present

## 2022-04-04 DIAGNOSIS — E669 Obesity, unspecified: Secondary | ICD-10-CM | POA: Diagnosis not present

## 2022-04-04 DIAGNOSIS — L570 Actinic keratosis: Secondary | ICD-10-CM | POA: Diagnosis not present

## 2022-04-04 DIAGNOSIS — X32XXXD Exposure to sunlight, subsequent encounter: Secondary | ICD-10-CM | POA: Diagnosis not present

## 2022-04-04 NOTE — Patient Instructions (Addendum)

## 2022-04-04 NOTE — Progress Notes (Signed)
Subjective:    Patient ID: Rhonda Gregory is a 81 y.o. female.  HPI    Star Age, MD, PhD [facility] [address] [place], [place] [address]  Dear Dr. Virgina Jock,  I saw your patient, Rhonda Gregory, upon your kind request in my sleep clinic today for initial consultation of her sleep disorder, in particular, evaluation of her prior diagnosis of sleep apnea.  The patient is unaccompanied today. As you know, this is a 81 year old female with an underlying medical history of hypertension, hyperlipidemia, diverticulosis, history of gastric ulcer, arthritis, uterine cancer, and overweight state, who was previously diagnosed with obstructive sleep apnea and placed on PAP therapy.  Prior sleep study results are not available for my review today.  Sleep testing was at [facility] sleep lab, by her description, she had a split-night sleep study at the time.  I reviewed her CPAP compliance data from the past 90 days from 01/05/2022 through 04/03/2022, during which time she used her machine every night with percent use days greater than 4 hours at 100%, indicating superb compliance, average usage of 8 hours and 36 minutes, residual AHI mildly elevated at 8.3/h, leak on the low side with the 95th percentile at 4.2 L/min on a pressure of 4 cm with EPR.  She is on a set pressure.  She has a ResMed AirSense 10 AutoSet machine and set up date was June 2016.  She is on her second machine.  She would like to get an updated machine.  She benefits from treatment.  She reports that it took her a long time to get used to using CPAP but now she is consistent with it and generally does not go without treatment.  Her DME company's choice home but she would like to switch companies and establish care with the same DME company her husband has.  She lives with her husband.  She is retired, she taught at [employer].  She does not drink any caffeine daily, she drinks alcohol  rarely, typically only on special occasion, she takes Unisom as needed.  Bedtime is generally between 10 and 11 PM and rise time between 6:30 AM and 7 AM.  She does not have night to night nocturia, or recurrent nocturnal or morning headaches.  Her son has a CPAP machine and she has a brother with sleep apnea as well.  She had septum surgery in the past, she is currently working on weight loss.  She had a dermatological procedure today. Her Epworth sleepiness score is 2 out of 24, fatigue severity score is 14 out of 63.    Her Past Medical History Is Significant For: Past Medical History:  Diagnosis Date   Arthritis    Colon polyps    Diverticulosis    Gastric ulcer    Heartburn    HLD (hyperlipidemia)    Hypertension    PONV (postoperative nausea and vomiting)    Sleep apnea    wears CPAP   Uterine cancer ([place]) 1995    Her Past Surgical History Is Significant For: Past Surgical History:  Procedure Laterality Date   ABDOMINAL HYSTERECTOMY     with salpingooophorectomy   CATARACT EXTRACTION, BILATERAL Bilateral 2019   COLONOSCOPY W/ POLYPECTOMY     DUPUYTREN CONTRACTURE RELEASE Bilateral    HEMORRHOID SURGERY     KNEE ARTHROSCOPY Right    SUPERFICIAL PERONEAL NERVE RELEASE Right 08/05/2019   Procedure: Right peroneal nerve decompression;  Surgeon: Gaynelle Arabian,  MD;  Location: [facility];  Service: Orthopedics;  Laterality: Right;  47mn   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   Procedure: RIGHT TOTAL KNEE ARTHROPLASTY;  Surgeon: FGearlean Alf MD;  Location: [facility];  Service: Orthopedics;  Laterality: Right;    Her Family History Is Significant For: Family History  Problem Relation Age of Onset   Breast cancer Mother    Colon cancer Mother    Heart attack Father 466  Diabetes Father    Esophageal cancer Neg Hx    Stomach cancer Neg Hx    Rectal cancer Neg Hx    Sleep apnea Neg Hx     Her Social History Is Significant For: Social History   Socioeconomic History    Marital status: Married    Spouse name: Not on file   Number of children: 4   Years of education: Not on file   Highest education level: Not on file  Occupational History   Occupation: retired    [employer] farm manager RETIRED  Tobacco Use   Smoking status: Former    Packs/day: 1.00    Years: 4.00    Total pack years: 4.00    Types: Cigarettes    Quit date: 08/08/1960    Years since quitting: 61.6   Smokeless tobacco: Never  Vaping Use   Vaping Use: Never used  Substance and Sexual Activity   Alcohol use: Not Currently    Comment: 1-2 glasses wine weekly- nothing in the last 3 months   Drug use: No   Sexual activity: Not on file  Other Topics Concern   Not on file  Social History Narrative   Not on file   Social Determinants of Health   Financial Resource Strain: Not on file  Food Insecurity: Not on file  Transportation Needs: Not on file  Physical Activity: Not on file  Stress: Not on file  Social Connections: Not on file    Her Allergies Are:  Allergies  Allergen Reactions   Codeine Nausea Only   Dilaudid [Hydromorphone Hcl] Nausea Only and Other (See Comments)    Abdominal pains  :   Her Current Medications Are:  Outpatient Encounter Medications as of 04/04/2022  Medication Sig   acetaminophen (TYLENOL) 650 MG CR tablet Take 1,300 mg by mouth every 8 (eight) hours as needed for pain.   augmented betamethasone dipropionate (DIPROLENE-AF) 0.05 % ointment Apply 1 application  topically as needed (skin irritation/rash).   gabapentin (NEURONTIN) 300 MG capsule Take 300 mg by mouth 3 (three) times daily.   meclizine (ANTIVERT) 25 MG tablet Take 1 tablet (25 mg total) by mouth 3 (three) times daily as needed for dizziness. (Patient taking differently: Take 25 mg by mouth as needed for dizziness.)   methocarbamol (ROBAXIN) 500 MG tablet Take 500 mg by mouth as needed for muscle spasms.   naproxen (NAPROSYN) 500 MG tablet Take 500 mg by mouth 2 (two) times daily as needed for pain.    olmesartan-hydrochlorothiazide (BENICAR HCT) 40-25 MG tablet Take 1 tablet by mouth daily.   ondansetron (ZOFRAN ODT) 8 MG disintegrating tablet Take 1 tablet (8 mg total) by mouth every 8 (eight) hours as needed for nausea or vomiting. (Patient taking differently: Take 8 mg by mouth as needed for nausea or vomiting.)   simvastatin (ZOCOR) 20 MG tablet Take 20 mg by mouth at bedtime.    cyclobenzaprine (FLEXERIL) 5 MG tablet Take 5 mg by mouth 3 (three) times daily as needed for spasms. (Patient not taking:  Reported on 04/04/2022)   traMADol (ULTRAM) 50 MG tablet Take 50 mg by mouth 3 (three) times daily as needed for pain.   No facility-administered encounter medications on file as of 04/04/2022.  :   Review of Systems:  Out of a complete 14 point review of systems, all are reviewed and negative with the exception of these symptoms as listed below:    Review of Systems  Neurological:        Pt here for sleep consult Pt uses CPAP everynight Pt states that there is a message on  machine Motor life exceeded. Pt states she is here due to insurance making her take sleep study again Pt set up date 11/2014    ESS:2 FSS :14    Objective:  Neurological Exam  Physical Exam Physical Examination:   Vitals:   04/04/22 1341  BP: 128/77  Pulse: 66   General Examination: The patient is a very pleasant 81 y.o. female in no acute distress. She appears well-developed and well-nourished and well groomed.   HEENT: Normocephalic, atraumatic, pupils are equal, round and reactive to light, extraocular tracking is good without limitation to gaze excursion or nystagmus noted. Hearing is grossly intact. Face is symmetric with normal facial animation. Speech is clear with no dysarthria noted. There is no hypophonia. There is no lip, neck/head, jaw or voice tremor. Neck is supple with full range of passive and active motion.  Facial redness noted, left more than right, and a dermatological procedure  today.   There are no carotid bruits on auscultation. Oropharynx exam reveals: mild mouth dryness, adequate dental hygiene and mild airway crowding, due to small airway entry, tonsils on the smaller side.  Mallampati class II.  Neck circumference of 15 inches.  Minimal overbite.  Tongue protrudes centrally and palate elevates symmetrically.  Chest: Clear to auscultation without wheezing, rhonchi or crackles noted.  Heart: S1+S2+0, regular and normal without murmurs, rubs or gallops noted.   Abdomen: Soft, non-tender and non-distended.  Extremities: There is no pitting edema in the distal lower extremities bilaterally.   Skin: Warm and dry without trophic changes noted.   Musculoskeletal: exam reveals no obvious joint deformities.   Neurologically:  Mental status: The patient is awake, alert and oriented in all 4 spheres. Her immediate and remote memory, attention, language skills and fund of knowledge are appropriate. There is no evidence of aphasia, agnosia, apraxia or anomia. Speech is clear with normal prosody and enunciation. Thought process is linear. Mood is normal and affect is normal.  Cranial nerves II - XII are as described above under HEENT exam.  Motor exam: Normal bulk, strength and tone is noted. There is no obvious action or resting tremor.  Fine motor skills and coordination: grossly intact.  Cerebellar testing: No dysmetria or intention tremor. There is no truncal or gait ataxia.  Sensory exam: intact to light touch in the upper and lower extremities.  Gait, station and balance: She stands easily. No veering to one side is noted. No leaning to one side is noted. Posture is age-appropriate and stance is narrow based. Gait shows normal stride length and normal pace. No problems turning are noted.   Assessment and Plan:  In summary, CYANNE DELMAR is a very pleasant 81 y.o.-year old female with an underlying medical history of hypertension, hyperlipidemia, diverticulosis,  history of gastric ulcer, arthritis, uterine cancer, and overweight state, who presents for evaluation of her obstructive sleep apnea.  She was originally diagnosed several years ago, it  sounds like she had a split-night sleep study at the time which would indicate at least moderate obstructive sleep apnea.  She has been on CPAP therapy, currently on her second machine since June 2016 but is eligible for new set of equipment.  She would like to establish with a new DME provider as well.  She is compliant with her CPAP, residual AHI is mildly elevated, we should try to adjust pressure settings with her new machine.  We will proceed with sleep testing to update diagnosis and then I would like to prescribe a new machine and supplies. I had a long chat with the patient about my findings and the diagnosis of sleep apnea, particularly OSA, its prognosis and treatment options. We talked about medical/conservative treatments, surgical interventions and non-pharmacological approaches for symptom control. I explained, in particular, the risks and ramifications of untreated moderate to severe OSA, especially with respect to developing cardiovascular disease down the road, including congestive heart failure (CHF), difficult to treat hypertension, cardiac arrhythmias (particularly A-fib), neurovascular complications including TIA, stroke and dementia. Even type 2 diabetes has, in part, been linked to untreated OSA. Symptoms of untreated OSA may include (but may not be limited to) daytime sleepiness, nocturia (i.e. frequent nighttime urination), memory problems, mood irritability and suboptimally controlled or worsening mood disorder such as depression and/or anxiety, lack of energy, lack of motivation, physical discomfort, as well as recurrent headaches, especially morning or nocturnal headaches. We talked about the importance of maintaining a healthy lifestyle and striving for healthy weight.  I recommended the following at  this time: sleep study.  I outlined the differences between a laboratory attended sleep study which is considered more comprehensive and accurate over the option of a home sleep test (HST); the latter may lead to underestimation of sleep disordered breathing in some instances and does not help with diagnosing upper airway resistance syndrome and is not accurate enough to diagnose primary central sleep apnea typically. I explained the different sleep test procedures to the patient in detail and also outlined possible surgical and non-surgical treatment options of OSA, including the use of a pressure airway pressure (PAP) device (ie CPAP, AutoPAP/APAP or BiPAP in certain circumstances), a custom-made dental device (aka oral appliance, which would require a referral to a specialist dentist or orthodontist typically, and is generally speaking not considered a good choice for patients with full dentures or edentulous state), upper airway surgical options, such as traditional UPPP (which is not considered a first-line treatment) or the Inspire device (hypoglossal nerve stimulator, which would involve a referral for consultation with an ENT surgeon, after careful selection, following inclusion criteria). I explained the PAP treatment option to the patient in detail, as this is generally considered first-line treatment.  The patient indicated that she would be willing to continue with positive airway pressure therapy, she would not mind trying an AutoPap machine rather than CPAP.  I explained the importance of being compliant with PAP treatment, not only for insurance purposes but primarily to improve patient's symptoms symptoms, and for the patient's long term health benefit, including to reduce Her cardiovascular risks longer-term.    We will pick up our discussion about the next steps and treatment options after testing.  We will keep her posted as to the test results by phone call and/or MyChart messaging where  possible.  We will plan to follow-up in sleep clinic accordingly as well.  I answered all her questions today and the patient was in agreement.   I  encouraged her to call with any interim questions, concerns, problems or updates or email [place] through [place].  Generally speaking, sleep test authorizations may take up to 2 weeks, sometimes less, sometimes longer, the patient is encouraged to get in touch with [place] if they do not hear back from the sleep lab staff directly within the next 2 weeks.  Thank you very much for allowing me to participate in the care of this nice patient. If I can be of any further assistance to you please do not hesitate to call me at [phone].  Sincerely,   Star Age, MD, PhD

## 2022-05-02 ENCOUNTER — Telehealth: Payer: Self-pay | Admitting: Neurology

## 2022-05-02 NOTE — Telephone Encounter (Signed)
Patient left me a voicemail on my phone stating that she was upset because it has been awhile since she saw the Doctor and she was told two weeks she would hear something. she insist I call her back today and make her's a high priority.  I called her back and offered her our visit available for the in lab which was in January she stated she was not happy about that and that will be too long because she has to get knee surgery. She asked about the home sleep study and I informed her that we could schedule that. She then gives the phone to the husband and asked if he could pick the device up tomorrow since he will be in to see Frann Rider. I informed him that he can pick up the device tomorrow and she will have to bring it back the next day. He stated they will do that.  pt husband Yvone Neu is seeing Frann Rider, NP at 11:15 AM & will pick the device up then pt will drop device off the next day at 2 pm   HST- [employer] no auth req spoke to [place] ref # [id number]

## 2022-05-03 ENCOUNTER — Ambulatory Visit (INDEPENDENT_AMBULATORY_CARE_PROVIDER_SITE_OTHER): Payer: Medicare PPO | Admitting: Neurology

## 2022-05-03 DIAGNOSIS — G4733 Obstructive sleep apnea (adult) (pediatric): Secondary | ICD-10-CM

## 2022-05-03 DIAGNOSIS — Z820 Family history of epilepsy and other diseases of the nervous system: Secondary | ICD-10-CM

## 2022-05-03 DIAGNOSIS — E669 Obesity, unspecified: Secondary | ICD-10-CM

## 2022-05-04 DIAGNOSIS — M170 Bilateral primary osteoarthritis of knee: Secondary | ICD-10-CM | POA: Diagnosis not present

## 2022-05-04 DIAGNOSIS — M1712 Unilateral primary osteoarthritis, left knee: Secondary | ICD-10-CM | POA: Diagnosis not present

## 2022-05-05 NOTE — Progress Notes (Signed)
See procedure note.

## 2022-05-05 NOTE — Procedures (Signed)
[facility]  HOME SLEEP TEST (Watch PAT) REPORT  STUDY DATE: 05/03/22  DOB: December 02, 1940  MRN: [id number]  ORDERING CLINICIAN: Star Age, MD, PhD   REFERRING CLINICIAN: Shon Baton, MD   CLINICAL INFORMATION/HISTORY: 81 year old female with an underlying medical history of hypertension, hyperlipidemia, diverticulosis, history of gastric ulcer, arthritis, uterine cancer, and overweight state, who was previously diagnosed with obstructive sleep apnea and placed on PAP therapy.  She has been compliant with CPAP therapy but has an older machine.  She presents for reevaluation, she should qualify for new equipment.  Epworth sleepiness score: 2/24.  BMI: 31.6 kg/m  FINDINGS:   Sleep Summary:   Total Recording Time (hours, min): 8 hours, 4 min  Total Sleep Time (hours, min):  7 hours, 23 min  Percent REM (%):    11.7%   Respiratory Indices:   Calculated pAHI (per hour):  29.8/hour         REM pAHI:    34.6/hour       NREM pAHI: 29.2/hour  Central pAHI: 2.4/hour  Oxygen Saturation Statistics:    Oxygen Saturation (%) Mean: 94%   Minimum oxygen saturation (%):                 85%   O2 Saturation Range (%): 85 - 98%    O2 Saturation (minutes) <=88%: 0.4 min  Pulse Rate Statistics:   Pulse Mean (bpm):    71/min    Pulse Range (54 - 109/min)   IMPRESSION: OSA (obstructive sleep apnea)   RECOMMENDATION:  This home sleep test demonstrates moderate obstructive sleep apnea with a total AHI of 29.8/hour and O2 nadir of 85%. Snoring was detected, mostly in the moderate to loud range. Ongoing treatment with a positive airway pressure (PAP) device is recommended. The patient has been compliant with CPAP therapy.  She should be eligible for a new machine, I will write for a new AutoPap machine. A full night titration study may be considered to optimize treatment settings, monitor proper oxygen saturations and aid with improvement of tolerance and  adherence, if needed down the road. Alternative treatment options may include a dental device through dentistry or orthodontics in selected patients or Inspire (hypoglossal nerve stimulator) in carefully selected patients (meeting inclusion criteria).  Concomitant weight loss is recommended (where clinically appropriate). Please note that untreated obstructive sleep apnea may carry additional perioperative morbidity. Patients with significant obstructive sleep apnea should receive perioperative PAP therapy and the surgeons and particularly the anesthesiologist should be informed of the diagnosis and the severity of the sleep disordered breathing. The patient should be cautioned not to drive, work at heights, or operate dangerous or heavy equipment when tired or sleepy. Review and reiteration of good sleep hygiene measures should be pursued with any patient. Other causes of the patient's symptoms, including circadian rhythm disturbances, an underlying mood disorder, medication effect and/or an underlying medical problem cannot be ruled out based on this test. Clinical correlation is recommended.  The patient and her referring provider will be notified of the test results. The patient will be seen in follow up in sleep clinic at [facility].  I certify that I have reviewed the raw data recording prior to the issuance of this report in accordance with the standards of the American Academy of Sleep Medicine (AASM).  INTERPRETING PHYSICIAN:   Star Age, MD, PhD  Board Certified in Neurology and Sleep Medicine  [facility] [address], [place], [place] [address] [phone]

## 2022-05-05 NOTE — Addendum Note (Signed)
Addended by: Star Age on: 05/05/2022 05:55 PM   Modules accepted: Orders

## 2022-05-09 ENCOUNTER — Telehealth: Payer: Self-pay

## 2022-05-09 NOTE — Telephone Encounter (Signed)
-----   Message from Star Age, MD sent at 05/05/2022  5:55 PM EST ----- Patient referred by PCP for re-eval of her OSA, seen by me on 04/04/2022, patient had a HST on 05/03/2022.    Please call and notify the patient that the recent home sleep test showed obstructive sleep apnea in the moderate range.  I would like to write for a new autoPAP machine for her.  We can send the order to her existing DME company.  Please reinforce ongoing compliance with treatment and the need for a follow-up within 3 months of set up.   Thanks,   Star Age, MD, PhD [facility])

## 2022-05-09 NOTE — Telephone Encounter (Signed)
I called patient to discuss. No answer, left a message asking her to call us back. If patient calls back another day please route to POD 4. ?

## 2022-05-10 NOTE — Telephone Encounter (Signed)
Tried to reach pt a second time. LVM asking for call back.

## 2022-05-11 ENCOUNTER — Telehealth: Payer: Self-pay | Admitting: *Deleted

## 2022-05-11 NOTE — Telephone Encounter (Signed)
-----   Message from Star Age, MD sent at 05/05/2022  5:55 PM EST ----- Patient referred by PCP for re-eval of her OSA, seen by me on 04/04/2022, patient had a HST on 05/03/2022.    Please call and notify the patient that the recent home sleep test showed obstructive sleep apnea in the moderate range.  I would like to write for a new autoPAP machine for her.  We can send the order to her existing DME company.  Please reinforce ongoing compliance with treatment and the need for a follow-up within 3 months of set up.   Thanks,   Star Age, MD, PhD [facility])

## 2022-05-11 NOTE — Telephone Encounter (Signed)
Spoke to pt and gave her the results.  Moderate range OSA.  New machine ordered.  [employer] does not take her insurance.  [facility] will be her new DME.  Pt aware and was given number.  Will let them know of new order.  Pt needs 30 -90 day follow up with new mchine.  LMVM for her to call back to schedule.

## 2022-05-17 ENCOUNTER — Telehealth: Payer: Self-pay | Admitting: Neurology

## 2022-05-17 NOTE — Telephone Encounter (Signed)
Stocks, Ashly  Brandon Melnick, RN; [place], Stefanie; [place], Karna Christmas Got It!!  Thank You     Previous Messages    ----- Message ----- From: Brandon Melnick, RN Sent: 05/16/2022  12:16 PM EST To: Rhonda Gregory; Rhonda Gregory; * Subject: new pap user                                  Good afternoon,  I wanted to follow up on this pt for a new PAP user.  Was not sure you received the message.  Rhonda Gregory Female, 81 y.o., Jun 20, 1941 MRN: [id number]   [place]

## 2022-05-17 NOTE — Telephone Encounter (Signed)
Called pt and LVM stating that she is needing to schedule her Initial AutoPAP visit. DME is in [place] and between dates are 06/04/22-08/03/22

## 2022-05-18 NOTE — Telephone Encounter (Signed)
Pt schedule initial CPAP appt on 06/29/22 at 3:45pm. Pt has not picked up new machine, but will call a soon as pick up machine. Could  schedule an appt in February because will be in [place]. Informed pt to bring machine and power cord

## 2022-05-19 NOTE — Telephone Encounter (Signed)
I spoke to pt she does not need new machine, just supplies.  I relayed that the order sent was for a new machine, and supplies.  She is eligible if she wants one.  AUTOPAP what is ordered and she has cpap.  I tried to call [facility].  They are not available after hours.  I will call Monday.

## 2022-05-19 NOTE — Telephone Encounter (Signed)
Pt said [facility] informed do not need to come back to [place] because do not need to get a new machine just need supplies. Could you send prescription to [facility] to able to get supplies? Would like a call from the nurse to discuss.

## 2022-05-23 NOTE — Telephone Encounter (Signed)
Coltrane, Phylliss Blakes, RN; Sunnie Nielsen printed  thanks     Previous Messages    ----- Message ----- From: Brandon Melnick, RN Sent: 05/11/2022   9:47 AM EST To: Sunnie Nielsen; Terri Coltrane; * Subject: new PAP machine pt and order                  Good Morning,   Happy Thanksgiving.  New order in epic for pt  Rhonda Gregory Female, 81 y.o., 05-07-1941 MRN: [id number] Phone: [phone]   [facility]

## 2022-05-23 NOTE — Telephone Encounter (Signed)
Spoke to [place] received  new patient order just waiting for insurance approval and than can call patient to set appointment up . LVM to make patient aware of her update status from [facility]

## 2022-05-23 NOTE — Telephone Encounter (Signed)
I called Rhonda Gregory, and she sent message to them at [place] ([place] office) about the order for supplies for pt.  No need for new machine , patient has one just needs supplies.  They are to call me back.

## 2022-06-02 DIAGNOSIS — M5416 Radiculopathy, lumbar region: Secondary | ICD-10-CM | POA: Diagnosis not present

## 2022-06-27 NOTE — Telephone Encounter (Signed)
Pt is calling. Stated she can't get up with [facility] and she wants a prescription for supplies sent to [facility] please fax [phone]. Pt said this is going on two months and she haven't heard anything from [facility] and this is not acceptable.

## 2022-06-27 NOTE — Telephone Encounter (Signed)
I tried to call and was on hold to [facility].  I then sent a message to them (community).  Awaiting word.

## 2022-06-28 NOTE — Telephone Encounter (Signed)
I did fax over the order and information they needed to assist her in getting supplies for her cpap.

## 2022-06-28 NOTE — Telephone Encounter (Signed)
Pt called back and she said that [facility] is not in network but she has been in touch with them to assist her.  I told her that I will go ahead and fax to them after I get off phone.

## 2022-06-28 NOTE — Telephone Encounter (Signed)
I called pt and LMVM for her that [facility] does not take [facility].  Does she have different insurance?  Can send to another DME.  Please call back when she can to discuss.

## 2022-06-28 NOTE — Telephone Encounter (Signed)
Fax confirmation received 18pgs.

## 2022-06-29 ENCOUNTER — Ambulatory Visit: Payer: Medicare PPO | Admitting: Adult Health

## 2022-07-18 DIAGNOSIS — G4733 Obstructive sleep apnea (adult) (pediatric): Secondary | ICD-10-CM | POA: Diagnosis not present

## 2022-07-28 NOTE — Telephone Encounter (Signed)
Called pt. Left VM message to please call office to schedule appointment.

## 2022-08-01 NOTE — Telephone Encounter (Signed)
Coltrane, Rhonda Blakes, RN; Rhonda Gregory This has been taken care of     Previous Messages    ----- Message ----- From: Brandon Melnick, RN Sent: 06/27/2022   2:44 PM EST To: Rhonda Gregory; Terri Coltrane Subject: supplies for pt                                Hi could you call this pt she has been trying to get new supplies, not machine but supplies for the last 2 months.  Not sure what the issue is.  Rhonda Gregory Rhonda Gregory, 82 y.o., 19-Jun-1941 MRN: [id number] Phone: [phone]

## 2022-08-02 ENCOUNTER — Telehealth: Payer: Self-pay | Admitting: Neurology

## 2022-08-02 NOTE — Telephone Encounter (Addendum)
Pt was scheduled for her initial CPAP visit on 08/29/22 Pt was informed to bring machine and power cord to the appointment.  DME in pt's SnapShot.

## 2022-08-11 ENCOUNTER — Ambulatory Visit: Payer: Medicare PPO | Admitting: Family Medicine

## 2022-08-18 DIAGNOSIS — G4733 Obstructive sleep apnea (adult) (pediatric): Secondary | ICD-10-CM | POA: Diagnosis not present

## 2022-08-18 DIAGNOSIS — M25562 Pain in left knee: Secondary | ICD-10-CM | POA: Diagnosis not present

## 2022-08-22 ENCOUNTER — Ambulatory Visit: Payer: Medicare PPO | Attending: Student | Admitting: Physical Therapy

## 2022-08-22 ENCOUNTER — Encounter: Payer: Self-pay | Admitting: Physical Therapy

## 2022-08-22 DIAGNOSIS — R262 Difficulty in walking, not elsewhere classified: Secondary | ICD-10-CM

## 2022-08-22 DIAGNOSIS — M25562 Pain in left knee: Secondary | ICD-10-CM

## 2022-08-22 NOTE — Therapy (Signed)
OUTPATIENT PHYSICAL THERAPY LOWER EXTREMITY EVALUATION   Patient Name: Rhonda Gregory MRN: [id number] DOB:Nov 11, 1940, 82 y.o., female Today's Date: 08/22/2022  END OF SESSION:  PT End of Session - 08/22/22 0902     Visit Number 1    Number of Visits 12    Date for PT Re-Evaluation 10/22/22    Authorization Type [employer]    PT Start Time [phone]    PT Stop Time 0940    PT Time Calculation (min) 45 min    Activity Tolerance Patient tolerated treatment well    Behavior During Therapy WFL for tasks assessed/performed             Past Medical History:  Diagnosis Date   Arthritis    Colon polyps    Diverticulosis    Gastric ulcer    Heartburn    HLD (hyperlipidemia)    Hypertension    PONV (postoperative nausea and vomiting)    Sleep apnea    wears CPAP   Uterine cancer ([place]) 1995   Past Surgical History:  Procedure Laterality Date   ABDOMINAL HYSTERECTOMY     with salpingooophorectomy   CATARACT EXTRACTION, BILATERAL Bilateral 2019   COLONOSCOPY W/ POLYPECTOMY     DUPUYTREN CONTRACTURE RELEASE Bilateral    HEMORRHOID SURGERY     KNEE ARTHROSCOPY Right    SUPERFICIAL PERONEAL NERVE RELEASE Right 08/05/2019   Procedure: Right peroneal nerve decompression;  Surgeon: Gaynelle Arabian, MD;  Location: [facility];  Service: Orthopedics;  Laterality: Right;  92mn   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   Procedure: RIGHT TOTAL KNEE ARTHROPLASTY;  Surgeon: FGearlean Alf MD;  Location: [facility];  Service: Orthopedics;  Laterality: Right;   Patient Active Problem List   Diagnosis Date Noted   Peroneal mononeuropathy, 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   History of uterine cancer 09/19/2013   Diverticulosis of colon without hemorrhage 09/19/2013   Postoperative anemia due to acute blood loss 08/13/2013   OA (osteoarthritis) of knee 08/12/2013    PCP: JShon Baton MD  REFERRING PROVIDER: RCoralie Keens PA  REFERRING DIAG: left knee pain, HS tendonitis  THERAPY DIAG:  Acute pain of left  knee  Difficulty in walking, not elsewhere classified  Rationale for Evaluation and Treatment: Rehabilitation  ONSET DATE: 07/22/22  SUBJECTIVE:   SUBJECTIVE STATEMENT: Patient reports that on Feb 2nd she started having left posterior knee pain, she is unsure of a cause, she is very active, does yoga, walks and other exercise classes.  She reports that for the past month she has not improved but she has tried to rest.  She will be going on a trip April 8th and needs to be able to walk  PERTINENT HISTORY: Right TKA PAIN:  Are you having pain? Yes: NPRS scale: 0/10 Pain location: left posterior knee Pain description: gnawing Aggravating factors: standing, walking pain up to 9/10 Relieving factors: rest, heat tylenol pain can be 0/10  PRECAUTIONS: None  WEIGHT BEARING RESTRICTIONS: No  FALLS:  Has patient fallen in last 6 months? No  LIVING ENVIRONMENT: Lives with: lives with their family and lives with their spouse Lives in: House/apartment Stairs: Yes: Internal: 12 steps; can reach both Has following equipment at home:  has bilateral walking sticks  OCCUPATION: retired  PLOF:  did not use anything for walking, 3 classes a wee for yoga, 2 classes a week for strength and stretching , some walking  PATIENT GOALS: have less pain, be able to go on a trip in April  NEXT MD  VISIT: none scheduled  OBJECTIVE:   DIAGNOSTIC FINDINGS: x-rays negative  PATIENT SURVEYS:  FOTO 28  COGNITION: Overall cognitive status: Within functional limits for tasks assessed     SENSATION: WFL  EDEMA:  normal  MUSCLE LENGTH: Hamstrings: Right 60 deg; Left 40 deg pain Very tight HS and Calf  POSTURE: rounded shoulders and forward head  PALPATION: She is very tender medial HS insertion  LOWER EXTREMITY ROM:  Active ROM Right eval Left eval  Hip flexion    Hip extension    Hip abduction    Hip adduction    Hip internal rotation    Hip external rotation    Knee flexion  91   Knee extension  20  Ankle dorsiflexion    Ankle plantarflexion    Ankle inversion    Ankle eversion     (Blank rows = not tested)  LOWER EXTREMITY MMT:  MMT Right eval Left eval  Hip flexion    Hip extension    Hip abduction    Hip adduction    Hip internal rotation    Hip external rotation    Knee flexion  3+  Knee extension  4  Ankle dorsiflexion    Ankle plantarflexion    Ankle inversion    Ankle eversion     (Blank rows = not tested) FUNCTIONAL TESTS:  5 times sit to stand: 19 seconds Timed up and go (TUG): 15 seconds 2 minute walk test: able to do 1 minute 6 seconds before pain up to 9/10 did 200 feet  GAIT: Distance walked: 200 feet Assistive device utilized:  uses 2 walking sticks Level of assistance: Complete Independence Comments: knees are in flexion, limp on the left    TODAY'S TREATMENT:                                                                                                                              DATE:     PATIENT EDUCATION:  Education details: POC, HEP Person educated: Patient Education method: Consulting civil engineer, Media planner, Corporate treasurer cues, and Verbal cues Education comprehension: verbalized understanding  HOME EXERCISE PROGRAM: Low load long duration HS stretch, foot propped up while sitting  ASSESSMENT:  CLINICAL IMPRESSION: Patient is a 82 y.o. female who was seen today for physical therapy evaluation and treatment for left knee pain/HS tendonitis.  Reports that about a month ago started having pain in the left posterior knee.  She is unsure of a cause but she is very active with gym classes about 5x/week.  She has very tight HS has a lag of about 20 degrees actively for knee extension, passive knee extension is very painful.  MMT for left knee flexion is painful.  She is very tender in the left posterior medial HS insertion.  She did well with the TUG just slightly slow, uses two walking sticks the biggest issue is walking for time or  distance, we tried a 2 minute walk test and  she had to stop at 1 minute and 6 seconds due to pain up to 9/10 at 200 feet.   OBJECTIVE IMPAIRMENTS: Abnormal gait, cardiopulmonary status limiting activity, decreased activity tolerance, decreased endurance, decreased mobility, difficulty walking, decreased ROM, decreased strength, increased fascial restrictions, increased muscle spasms, impaired flexibility, and pain.   REHAB POTENTIAL: Good  CLINICAL DECISION MAKING: Stable/uncomplicated  EVALUATION COMPLEXITY: Low   GOALS: Goals reviewed with patient? Yes  SHORT TERM GOALS: Target date: 09/05/22 Independent with initial HEP Goal status: INITIAL LONG TERM GOALS: Target date: 10/23/22  Independent with advanced HEP Goal status: INITIAL  2.  Increase active left knee extension to 10 degrees from fully straight Goal status: INITIAL  3.  Be able to walk 2 minutes without pain >4/10 Goal status: INITIAL  4.  Report pain overall with ADL's decreased 50% Goal status: INITIAL  5.  Decrease TUG time to 12 seconds Goal status: INITIAL  PLAN:  PT FREQUENCY: 1-2x/week  PT DURATION: 12 weeks  PLANNED INTERVENTIONS: Therapeutic exercises, Therapeutic activity, Neuromuscular re-education, Balance training, Gait training, Patient/Family education, Self Care, Joint mobilization, Joint manipulation, Stair training, Electrical stimulation, Cryotherapy, Moist heat, Taping, Vasopneumatic device, Ultrasound, Ionotophoresis '4mg'$ /ml Dexamethasone, and Manual therapy  PLAN FOR NEXT SESSION: start stretches and light exercise, iontophoresis   Ceira Hoeschen W, PT 08/22/2022, 9:03 AM

## 2022-08-23 ENCOUNTER — Ambulatory Visit: Payer: Medicare PPO

## 2022-08-29 ENCOUNTER — Ambulatory Visit: Payer: Medicare PPO | Admitting: Adult Health

## 2022-08-29 NOTE — Progress Notes (Signed)
PATIENT: Rhonda Gregory DOB: Jan 07, 1941  REASON FOR VISIT: follow up HISTORY FROM: patient PRIMARY NEUROLOGIST: Dr. Rexene Alberts  Chief Complaint  Patient presents with   Follow-up    Pt in 42 Pt here for CPAP f/u Pt states some leaking  from mask      HISTORY OF PRESENT ILLNESS: Today 08/29/22:  Rhonda Gregory is a 82 y.o. female with a history of OSA on CPAP. Returns today for follow-up.  She reports that the CPAP is working well for her.  She sometimes feels the mask leaking but knows how to adjust it.  Her download is below.  She states that they will be moving to [place] in the fall and will establish with the sleep physician there.       REVIEW OF SYSTEMS: Out of a complete 14 system review of symptoms, the patient complains only of the following symptoms, and all other reviewed systems are negative.   ESS 2  ALLERGIES: Allergies  Allergen Reactions   Codeine Nausea Only   Dilaudid [Hydromorphone Hcl] Nausea Only and Other (See Comments)    Abdominal pains    HOME MEDICATIONS: Outpatient Medications Prior to Visit  Medication Sig Dispense Refill   acetaminophen (TYLENOL) 650 MG CR tablet Take 1,300 mg by mouth every 8 (eight) hours as needed for pain.     augmented betamethasone dipropionate (DIPROLENE-AF) 0.05 % ointment Apply 1 application  topically as needed (skin irritation/rash).     cyclobenzaprine (FLEXERIL) 5 MG tablet Take 5 mg by mouth 3 (three) times daily as needed for spasms. (Patient not taking: Reported on 04/04/2022)     gabapentin (NEURONTIN) 300 MG capsule Take 300 mg by mouth 3 (three) times daily.     meclizine (ANTIVERT) 25 MG tablet Take 1 tablet (25 mg total) by mouth 3 (three) times daily as needed for dizziness. (Patient taking differently: Take 25 mg by mouth as needed for dizziness.) 21 tablet 0   methocarbamol (ROBAXIN) 500 MG tablet Take 500 mg by mouth as needed for muscle spasms.     naproxen (NAPROSYN) 500 MG tablet Take 500 mg by  mouth 2 (two) times daily as needed for pain.     olmesartan-hydrochlorothiazide (BENICAR HCT) 40-25 MG tablet Take 1 tablet by mouth daily.     ondansetron (ZOFRAN ODT) 8 MG disintegrating tablet Take 1 tablet (8 mg total) by mouth every 8 (eight) hours as needed for nausea or vomiting. (Patient taking differently: Take 8 mg by mouth as needed for nausea or vomiting.) 12 tablet 0   simvastatin (ZOCOR) 20 MG tablet Take 20 mg by mouth at bedtime.      traMADol (ULTRAM) 50 MG tablet Take 50 mg by mouth 3 (three) times daily as needed for pain.     No facility-administered medications prior to visit.    PAST MEDICAL HISTORY: Past Medical History:  Diagnosis Date   Arthritis    Colon polyps    Diverticulosis    Gastric ulcer    Heartburn    HLD (hyperlipidemia)    Hypertension    PONV (postoperative nausea and vomiting)    Sleep apnea    wears CPAP   Uterine cancer ([place]) 1995    PAST SURGICAL HISTORY: Past Surgical History:  Procedure Laterality Date   ABDOMINAL HYSTERECTOMY     with salpingooophorectomy   CATARACT EXTRACTION, BILATERAL Bilateral 2019   COLONOSCOPY W/ POLYPECTOMY     DUPUYTREN CONTRACTURE RELEASE Bilateral    HEMORRHOID SURGERY  KNEE ARTHROSCOPY Right    SUPERFICIAL PERONEAL NERVE RELEASE Right 08/05/2019   Procedure: Right peroneal nerve decompression;  Surgeon: Gaynelle Arabian, MD;  [facility];  Service: Orthopedics;  Laterality: Right;  13mn   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   Procedure: RIGHT TOTAL KNEE ARTHROPLASTY;  Surgeon: FGearlean Alf MD;  [facility];  Service: Orthopedics;  Laterality: Right;    FAMILY HISTORY: Family History  Problem Relation Age of Onset   Breast cancer Mother    Colon cancer Mother    Heart attack Father 478  Diabetes Father    Esophageal cancer Neg Hx    Stomach cancer Neg Hx    Rectal cancer Neg Hx    Sleep apnea Neg Hx     SOCIAL HISTORY: Social History   Socioeconomic History   Marital  status: Married    Spouse name: Not on file   Number of children: 4   Years of education: Not on file   Highest education level: Not on file  Occupational History   Occupation: retired    [employer] farm manager RETIRED  Tobacco Use   Smoking status: Former    Packs/day: 1.00    Years: 4.00    Total pack years: 4.00    Types: Cigarettes    Quit date: 08/08/1960    Years since quitting: 62.0   Smokeless tobacco: Never  Vaping Use   Vaping Use: Never used  Substance and Sexual Activity   Alcohol use: Not Currently    Comment: 1-2 glasses wine weekly- nothing in the last 3 months   Drug use: No   Sexual activity: Not on file  Other Topics Concern   Not on file  Social History Narrative   Not on file   Social Determinants of Health   Financial Resource Strain: Not on file  Food Insecurity: Not on file  Transportation Needs: Not on file  Physical Activity: Not on file  Stress: Not on file  Social Connections: Not on file  Intimate Partner Violence: Not on file      PHYSICAL EXAM  Vitals:   08/30/22 0751  BP: 116/64  Pulse: (!) 103  Weight: 164 lb (74.4 kg)  Height: '5\' 1"'$  (1.549 m)   Body mass index is 30.99 kg/m.  Generalized: Well developed, in no acute distress  Chest: Lungs clear to auscultation bilaterally  Neurological examination  Mentation: Alert oriented to time, place, history taking. Follows all commands speech and language fluent Cranial nerve II-XII: Extraocular movements were full, visual field were full on confrontational test Head turning and shoulder shrug  were normal and symmetric. Motor: The motor testing reveals 5 over 5 strength of all 4 extremities. Good symmetric motor tone is noted throughout.  Sensory: Sensory testing is intact to soft touch on all 4 extremities. No evidence of extinction is noted.  Gait and station: Gait is normal.    DIAGNOSTIC DATA (LABS, IMAGING, TESTING) - I reviewed patient records, labs, notes, testing and imaging myself  where available.  Lab Results  Component Value Date   WBC 6.9 07/31/2019   HGB 13.4 07/31/2019   HCT 42.2 07/31/2019   MCV 93.4 07/31/2019   PLT 247 07/31/2019      Component Value Date/Time   NA 138 07/31/2019 0957   K 4.5 07/31/2019 0957   CL 103 07/31/2019 0957   CO2 28 07/31/2019 0957   GLUCOSE 99 07/31/2019 0957   BUN 31 (H) 07/31/2019 0957   CREATININE 0.63 07/31/2019 0957  CALCIUM 9.6 07/31/2019 0957   PROT 6.8 07/24/2018 1310   ALBUMIN 3.9 07/24/2018 1310   AST 23 07/24/2018 1310   ALT 23 07/24/2018 1310   ALKPHOS 73 07/24/2018 1310   BILITOT 0.4 07/24/2018 1310   GFRNONAA >60 07/31/2019 0957   GFRAA >60 07/31/2019 0957      ASSESSMENT AND PLAN 82 y.o. year old female  has a past medical history of Arthritis, Colon polyps, Diverticulosis, Gastric ulcer, Heartburn, HLD (hyperlipidemia), Hypertension, PONV (postoperative nausea and vomiting), Sleep apnea, and Uterine cancer ([place]) (1995). here with:  OSA on CPAP  - CPAP compliance excellent - Good treatment of AHI  - Encourage patient to use CPAP nightly and > 4 hours each night - F/U in 1 year or sooner if needed    Ward Givens, MSN, NP-C 08/29/2022, 4:05 PM [facility] [address] [place], [place] [address] [phone]

## 2022-08-29 NOTE — Addendum Note (Signed)
Addended by: Sumner Boast on: 08/29/2022 12:00 PM   Modules accepted: Orders

## 2022-08-30 ENCOUNTER — Ambulatory Visit: Payer: Medicare PPO | Admitting: Adult Health

## 2022-08-30 ENCOUNTER — Encounter: Payer: Self-pay | Admitting: Adult Health

## 2022-08-30 VITALS — BP 116/64 | HR 103 | Ht 61.0 in | Wt 164.0 lb

## 2022-08-30 DIAGNOSIS — G4733 Obstructive sleep apnea (adult) (pediatric): Secondary | ICD-10-CM | POA: Diagnosis not present

## 2022-08-30 NOTE — Patient Instructions (Signed)
Continue using CPAP nightly and greater than 4 hours each night °If your symptoms worsen or you develop new symptoms please let us know.  ° °

## 2022-08-31 ENCOUNTER — Encounter: Payer: Self-pay | Admitting: Physical Therapy

## 2022-08-31 ENCOUNTER — Ambulatory Visit: Payer: Medicare PPO | Admitting: Physical Therapy

## 2022-08-31 DIAGNOSIS — R262 Difficulty in walking, not elsewhere classified: Secondary | ICD-10-CM

## 2022-08-31 DIAGNOSIS — M25562 Pain in left knee: Secondary | ICD-10-CM

## 2022-08-31 NOTE — Therapy (Signed)
OUTPATIENT PHYSICAL THERAPY LOWER EXTREMITY EVALUATION   Patient Name: Rhonda Gregory MRN: [id number] DOB:05-25-1941, 82 y.o., female Today's Date: 08/31/2022  END OF SESSION:  PT End of Session - 08/31/22 1745     Visit Number 2    Number of Visits 12    Date for PT Re-Evaluation 10/22/22    Authorization Type [employer]    PT Start Time [id number]    PT Stop Time [id number]    PT Time Calculation (min) 48 min    Activity Tolerance Patient tolerated treatment well    Behavior During Therapy WFL for tasks assessed/performed             Past Medical History:  Diagnosis Date   Arthritis    Colon polyps    Diverticulosis    Gastric ulcer    Heartburn    HLD (hyperlipidemia)    Hypertension    PONV (postoperative nausea and vomiting)    Sleep apnea    wears CPAP   Uterine cancer ([place]) 1995   Past Surgical History:  Procedure Laterality Date   ABDOMINAL HYSTERECTOMY     with salpingooophorectomy   CATARACT EXTRACTION, BILATERAL Bilateral 2019   COLONOSCOPY W/ POLYPECTOMY     DUPUYTREN CONTRACTURE RELEASE Bilateral    HEMORRHOID SURGERY     KNEE ARTHROSCOPY Right    SUPERFICIAL PERONEAL NERVE RELEASE Right 08/05/2019   Procedure: Right peroneal nerve decompression;  Surgeon: Gaynelle Arabian, MD;  [facility];  Service: Orthopedics;  Laterality: Right;  66mn   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   Procedure: RIGHT TOTAL KNEE ARTHROPLASTY;  Surgeon: FGearlean Alf MD;  [facility];  Service: Orthopedics;  Laterality: Right;   Patient Active Problem List   Diagnosis Date Noted   Peroneal mononeuropathy, 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  She is wanting to do exercises, but does have pain with walking that is significant   PERTINENT HISTORY: Right TKA PAIN:  Are you having pain? Yes: NPRS scale: 0/10 Pain location: left posterior knee Pain description: gnawing Aggravating factors: standing, walking pain up to 9/10 Relieving factors: rest, heat tylenol pain can be 0/10  PRECAUTIONS: None  WEIGHT BEARING RESTRICTIONS: No  FALLS:  Has patient fallen in last 6 months? No  LIVING ENVIRONMENT: Lives with: lives with their family and lives with their spouse Lives in: House/apartment Stairs: Yes: Internal: 12 steps; can reach both Has following equipment at home:  has bilateral walking sticks  OCCUPATION: retired  PLOF:  did not use anything for walking, 3 classes a wee for yoga, 2 classes a week for strength and stretching , some walking  PATIENT GOALS: have less pain, be able to go on a trip in April  NEXT MD VISIT: none scheduled  OBJECTIVE:   DIAGNOSTIC FINDINGS: x-rays negative  PATIENT SURVEYS:  FOTO 28  COGNITION: Overall cognitive status: Within functional limits for tasks assessed     SENSATION: WFL  EDEMA:  normal  MUSCLE LENGTH: Hamstrings: Right 60 deg; Left 40 deg pain Very tight HS and Calf  POSTURE: rounded shoulders and forward head  PALPATION: She is very tender medial HS insertion  LOWER EXTREMITY ROM:  Active ROM Right eval Left eval  Hip flexion    Hip extension    Hip abduction    Hip adduction    Hip internal rotation    Hip external rotation    Knee flexion  91  Knee extension  20  Ankle dorsiflexion    Ankle plantarflexion    Ankle inversion    Ankle eversion     (Blank rows = not tested)  LOWER EXTREMITY MMT:  MMT  Right eval Left eval  Hip flexion    Hip extension    Hip abduction    Hip adduction    Hip internal rotation    Hip external rotation    Knee flexion  3+  Knee extension  4  Ankle dorsiflexion    Ankle plantarflexion    Ankle inversion    Ankle eversion     (Blank rows = not tested) FUNCTIONAL TESTS:  5 times sit to stand: 19 seconds Timed up and go (TUG): 15 seconds 2 minute walk test: able to do 1 minute 6 seconds before pain up to 9/10 did 200 feet  GAIT: Distance walked: 200 feet Assistive device utilized:  uses 2 walking sticks Level of assistance: Complete Independence Comments: knees are in flexion, limp on the left    TODAY'S TREATMENT:                                                                                                                              DATE:   08/31/22 Nustep level 5 x 6 minutes LEg press 20# 2x10 Feet on ball K2C, trunk rotation, small bridge and isometric abs 10# straight arm pulls 2x10 Seated row 20# 2x10 LAts 20# 2x10 Passive stretch, STM to the HS Ionto 55m dose left posterior knee HS insertion  PATIENT EDUCATION:  Education details: POC, HEP Person educated: Patient Education method: EConsulting civil engineer Demonstration, Tactile cues, and Verbal cues Education comprehension: verbalized understanding  HOME EXERCISE PROGRAM: Low load long duration HS stretch, foot propped up while sitting  ASSESSMENT:  CLINICAL IMPRESSION: I initiated gym activities, she had mild pain in the front of her knee due to stiffness but no pain in the posterior knee, she is very tight and does not like to stretch into extension, I added ionto  OBJECTIVE IMPAIRMENTS: Abnormal gait, cardiopulmonary status limiting activity, decreased activity tolerance, decreased endurance, decreased mobility, difficulty walking, decreased ROM, decreased strength, increased fascial restrictions, increased muscle spasms, impaired flexibility, and pain.   REHAB POTENTIAL:  Good  CLINICAL DECISION MAKING: Stable/uncomplicated  EVALUATION COMPLEXITY: Low   GOALS: Goals reviewed with patient? Yes  SHORT TERM GOALS: Target date: 09/05/22 Independent with initial HEP Goal status: INITIAL LONG TERM GOALS: Target date: 10/23/22  Independent with advanced HEP Goal  status: INITIAL  2.  Increase active left knee extension to 10 degrees from fully straight Goal status: INITIAL  3.  Be able to walk 2 minutes without pain >4/10 Goal status: INITIAL  4.  Report pain overall with ADL's decreased 50% Goal status: INITIAL  5.  Decrease TUG time to 12 seconds Goal status: INITIAL  PLAN:  PT FREQUENCY: 1-2x/week  PT DURATION: 12 weeks  PLANNED INTERVENTIONS: Therapeutic exercises, Therapeutic activity, Neuromuscular re-education, Balance training, Gait training, Patient/Family education, Self Care, Joint mobilization, Joint manipulation, Stair training, Electrical stimulation, Cryotherapy, Moist heat, Taping, Vasopneumatic device, Ultrasound, Ionotophoresis '4mg'$ /ml Dexamethasone, and Manual therapy  PLAN FOR NEXT SESSION: assess today's treatment  Sumner Boast, PT 08/31/2022, 5:46 PM

## 2022-09-02 ENCOUNTER — Ambulatory Visit: Payer: Medicare PPO | Admitting: Physical Therapy

## 2022-09-02 ENCOUNTER — Encounter: Payer: Self-pay | Admitting: Physical Therapy

## 2022-09-02 DIAGNOSIS — M25562 Pain in left knee: Secondary | ICD-10-CM | POA: Diagnosis not present

## 2022-09-02 DIAGNOSIS — R262 Difficulty in walking, not elsewhere classified: Secondary | ICD-10-CM

## 2022-09-02 NOTE — Therapy (Signed)
OUTPATIENT PHYSICAL THERAPY LOWER EXTREMITY TREATMENT   Patient Name: Rhonda Gregory MRN: [id number] DOB:09-29-1940, 82 y.o., female Today's Date: 09/02/2022  END OF SESSION:  PT End of Session - 09/02/22 0843     Visit Number 3    Number of Visits 12    Date for PT Re-Evaluation 10/22/22    Authorization Type [employer]    PT Start Time [phone]    PT Stop Time 0927    PT Time Calculation (min) 46 min    Activity Tolerance Patient tolerated treatment well    Behavior During Therapy WFL for tasks assessed/performed             Past Medical History:  Diagnosis Date   Arthritis    Colon polyps    Diverticulosis    Gastric ulcer    Heartburn    HLD (hyperlipidemia)    Hypertension    PONV (postoperative nausea and vomiting)    Sleep apnea    wears CPAP   Uterine cancer ([place]) 1995   Past Surgical History:  Procedure Laterality Date   ABDOMINAL HYSTERECTOMY     with salpingooophorectomy   CATARACT EXTRACTION, BILATERAL Bilateral 2019   COLONOSCOPY W/ POLYPECTOMY     DUPUYTREN CONTRACTURE RELEASE Bilateral    HEMORRHOID SURGERY     KNEE ARTHROSCOPY Right    SUPERFICIAL PERONEAL NERVE RELEASE Right 08/05/2019   Procedure: Right peroneal nerve decompression;  Surgeon: Gaynelle Arabian, MD;  Location: [facility];  Service: Orthopedics;  Laterality: Right;  59min   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   Procedure: RIGHT TOTAL KNEE ARTHROPLASTY;  Surgeon: [name], MD;  Location: [facility];  Service: Orthopedics;  Laterality: Right;   Patient Active Problem List   Diagnosis Date Noted   Peroneal mononeuropathy, 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   History of uterine cancer 09/19/2013   Diverticulosis of colon without hemorrhage 09/19/2013   Postoperative anemia due to acute blood loss 08/13/2013   OA (osteoarthritis) of knee 08/12/2013    PCP: Shon Baton, MD  REFERRING PROVIDER: Coralie Keens, PA  REFERRING DIAG: left knee pain, HS tendonitis  THERAPY DIAG:  Acute pain of left  knee  Difficulty in walking, not elsewhere classified  Rationale for Evaluation and Treatment: Rehabilitation  ONSET DATE: 07/22/22  SUBJECTIVE:   SUBJECTIVE STATEMENT: I was okay but had some pain that night, had some tightness and discomfort   PERTINENT HISTORY: Right TKA PAIN:  Are you having pain? Yes: NPRS scale: 2/10 Pain location: left posterior knee Pain description: gnawing Aggravating factors: standing, walking pain up to 9/10 Relieving factors: rest, heat tylenol pain can be 0/10  PRECAUTIONS: None  WEIGHT BEARING RESTRICTIONS: No  FALLS:  Has patient fallen in last 6 months? No  LIVING ENVIRONMENT: Lives with: lives with their family and lives with their spouse Lives in: House/apartment Stairs: Yes: Internal: 12 steps; can reach both Has following equipment at home:  has bilateral walking sticks  OCCUPATION: retired  PLOF:  did not use anything for walking, 3 classes a wee for yoga, 2 classes a week for strength and stretching , some walking  PATIENT GOALS: have less pain, be able to go on a trip in April  NEXT MD VISIT: none scheduled  OBJECTIVE:   DIAGNOSTIC FINDINGS: x-rays negative  PATIENT SURVEYS:  FOTO 28  COGNITION: Overall cognitive status: Within functional limits for tasks assessed     SENSATION: WFL  EDEMA:  normal  MUSCLE LENGTH: Hamstrings: Right 60 deg; Left 40 deg pain Very  tight HS and Calf  POSTURE: rounded shoulders and forward head  PALPATION: She is very tender medial HS insertion  LOWER EXTREMITY ROM:  Active ROM Right eval Left eval  Hip flexion    Hip extension    Hip abduction    Hip adduction    Hip internal rotation    Hip external rotation    Knee flexion  91  Knee extension  20  Ankle dorsiflexion    Ankle plantarflexion    Ankle inversion    Ankle eversion     (Blank rows = not tested)  LOWER EXTREMITY MMT:  MMT Right eval Left eval  Hip flexion    Hip extension    Hip abduction     Hip adduction    Hip internal rotation    Hip external rotation    Knee flexion  3+  Knee extension  4  Ankle dorsiflexion    Ankle plantarflexion    Ankle inversion    Ankle eversion     (Blank rows = not tested) FUNCTIONAL TESTS:  5 times sit to stand: 19 seconds Timed up and go (TUG): 15 seconds 2 minute walk test: able to do 1 minute 6 seconds before pain up to 9/10 did 200 feet  GAIT: Distance walked: 200 feet Assistive device utilized:  uses 2 walking sticks Level of assistance: Complete Independence Comments: knees are in flexion, limp on the left    TODAY'S TREATMENT:                                                                                                                              DATE:   09/02/22 Nustep level 5 x 6 minutes 10# straight arm pulls Seated row 20# Lats 20# Leg press 20# 3x10 Feet on ball K2C, trunk rotaiton, small bridge and iso abs Passive stretch ITB and HS on the left some focus on calf and posterior knee stretches STM to the above Ionto 42mA dose left posterior HS insertion  08/31/22 Nustep level 5 x 6 minutes LEg press 20# 2x10 Feet on ball K2C, trunk rotation, small bridge and isometric abs 10# straight arm pulls 2x10 Seated row 20# 2x10 LAts 20# 2x10 Passive stretch, STM to the HS Ionto 4mA dose left posterior knee HS insertion  PATIENT EDUCATION:  Education details: POC, HEP Person educated: Patient Education method: Consulting civil engineer, Demonstration, Tactile cues, and Verbal cues Education comprehension: verbalized understanding  HOME EXERCISE PROGRAM: Low load long duration HS stretch, foot propped up while sitting  ASSESSMENT:  CLINICAL IMPRESSION: No increase of pain with the gym, I added some exercises and STM and continued with the ionto OBJECTIVE IMPAIRMENTS: Abnormal gait, cardiopulmonary status limiting activity, decreased activity tolerance, decreased endurance, decreased mobility, difficulty walking, decreased  ROM, decreased strength, increased fascial restrictions, increased muscle spasms, impaired flexibility, and pain.   REHAB POTENTIAL: Good  CLINICAL DECISION MAKING: Stable/uncomplicated  EVALUATION COMPLEXITY: Low   GOALS: Goals reviewed with patient? Yes  SHORT TERM GOALS: Target date: 09/05/22 Independent with initial HEP Goal status: met LONG TERM GOALS: Target date: 10/23/22  Independent with advanced HEP Goal status: INITIAL  2.  Increase active left knee extension to 10 degrees from fully straight Goal status: INITIAL  3.  Be able to walk 2 minutes without pain >4/10 Goal status: INITIAL  4.  Report pain overall with ADL's decreased 50% Goal status: INITIAL  5.  Decrease TUG time to 12 seconds Goal status: INITIAL  PLAN:  PT FREQUENCY: 1-2x/week  PT DURATION: 12 weeks  PLANNED INTERVENTIONS: Therapeutic exercises, Therapeutic activity, Neuromuscular re-education, Balance training, Gait training, Patient/Family education, Self Care, Joint mobilization, Joint manipulation, Stair training, Electrical stimulation, Cryotherapy, Moist heat, Taping, Vasopneumatic device, Ultrasound, Ionotophoresis 4mg /ml Dexamethasone, and Manual therapy  PLAN FOR NEXT SESSION:slowly progress  Sumner Boast, PT 09/02/2022, 8:44 AM

## 2022-09-05 ENCOUNTER — Ambulatory Visit: Payer: Medicare PPO | Admitting: Physical Therapy

## 2022-09-05 ENCOUNTER — Encounter: Payer: Self-pay | Admitting: Physical Therapy

## 2022-09-05 DIAGNOSIS — R262 Difficulty in walking, not elsewhere classified: Secondary | ICD-10-CM

## 2022-09-05 DIAGNOSIS — M25562 Pain in left knee: Secondary | ICD-10-CM | POA: Diagnosis not present

## 2022-09-05 NOTE — Therapy (Signed)
OUTPATIENT PHYSICAL THERAPY LOWER EXTREMITY TREATMENT   Patient Name: Rhonda Gregory MRN: [id number] DOB:[birth year], 82 y.o., female Today's Date: 09/05/2022  END OF SESSION:  PT End of Session - 09/05/22 1608     Visit Number 4    Number of Visits 12    Date for PT Re-Evaluation 10/22/22    Authorization Type [employer]    PT Start Time [id number]    PT Stop Time 1655    PT Time Calculation (min) 46 min    Activity Tolerance Patient tolerated treatment well    Behavior During Therapy WFL for tasks assessed/performed             Past Medical History:  Diagnosis Date   Arthritis    Colon polyps    Diverticulosis    Gastric ulcer    Heartburn    HLD (hyperlipidemia)    Hypertension    PONV (postoperative nausea and vomiting)    Sleep apnea    wears CPAP   Uterine cancer ([place]) 1995   Past Surgical History:  Procedure Laterality Date   ABDOMINAL HYSTERECTOMY     with salpingooophorectomy   CATARACT EXTRACTION, BILATERAL Bilateral 2019   COLONOSCOPY W/ POLYPECTOMY     DUPUYTREN CONTRACTURE RELEASE Bilateral    HEMORRHOID SURGERY     KNEE ARTHROSCOPY Right    SUPERFICIAL PERONEAL NERVE RELEASE Right 08/05/2019   Procedure: Right peroneal nerve decompression;  Surgeon: Gaynelle Arabian, MD;  Location: [facility];  Service: Orthopedics;  Laterality: Right;  85min   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   Procedure: RIGHT TOTAL KNEE ARTHROPLASTY;  Surgeon: [name], MD;  Location: [facility];  Service: Orthopedics;  Laterality: Right;   Patient Active Problem List   Diagnosis Date Noted   Peroneal mononeuropathy, 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   History of uterine cancer 09/19/2013   Diverticulosis of colon without hemorrhage 09/19/2013   Postoperative anemia due to acute blood loss 08/13/2013   OA (osteoarthritis) of knee 08/12/2013    PCP: Shon Baton, MD  REFERRING PROVIDER: Coralie Keens, PA  REFERRING DIAG: left knee pain, HS tendonitis  THERAPY DIAG:  Acute pain of left  knee  Difficulty in walking, not elsewhere classified  Rationale for Evaluation and Treatment: Rehabilitation  ONSET DATE: 07/22/22  SUBJECTIVE:   SUBJECTIVE STATEMENT: I am feeling better but something we did caused my right shoulder blade to be in spasm and I really started  having difficulty sleeping  PERTINENT HISTORY: Right TKA PAIN:  Are you having pain? Yes: NPRS scale: 2/10 Pain location: left posterior knee Pain description: gnawing Aggravating factors: standing, walking pain up to 9/10 Relieving factors: rest, heat tylenol pain can be 0/10  PRECAUTIONS: None  WEIGHT BEARING RESTRICTIONS: No  FALLS:  Has patient fallen in last 6 months? No  LIVING ENVIRONMENT: Lives with: lives with their family and lives with their spouse Lives in: House/apartment Stairs: Yes: Internal: 12 steps; can reach both Has following equipment at home:  has bilateral walking sticks  OCCUPATION: retired  PLOF:  did not use anything for walking, 3 classes a wee for yoga, 2 classes a week for strength and stretching , some walking  PATIENT GOALS: have less pain, be able to go on a trip in April  NEXT MD VISIT: none scheduled  OBJECTIVE:   DIAGNOSTIC FINDINGS: x-rays negative  PATIENT SURVEYS:  FOTO 28  COGNITION: Overall cognitive status: Within functional limits for tasks assessed     SENSATION: WFL  EDEMA:  normal  MUSCLE  LENGTH: Hamstrings: Right 60 deg; Left 40 deg pain Very tight HS and Calf  POSTURE: rounded shoulders and forward head  PALPATION: She is very tender medial HS insertion  LOWER EXTREMITY ROM:  Active ROM Right eval Left eval  Hip flexion    Hip extension    Hip abduction    Hip adduction    Hip internal rotation    Hip external rotation    Knee flexion  91  Knee extension  20  Ankle dorsiflexion    Ankle plantarflexion    Ankle inversion    Ankle eversion     (Blank rows = not tested)  LOWER EXTREMITY MMT:  MMT Right eval  Left eval  Hip flexion    Hip extension    Hip abduction    Hip adduction    Hip internal rotation    Hip external rotation    Knee flexion  3+  Knee extension  4  Ankle dorsiflexion    Ankle plantarflexion    Ankle inversion    Ankle eversion     (Blank rows = not tested) FUNCTIONAL TESTS:  5 times sit to stand: 19 seconds Timed up and go (TUG): 15 seconds 2 minute walk test: able to do 1 minute 6 seconds before pain up to 9/10 did 200 feet  GAIT: Distance walked: 200 feet Assistive device utilized:  uses 2 walking sticks Level of assistance: Complete Independence Comments: knees are in flexion, limp on the left    TODAY'S TREATMENT:                                                                                                                              DATE:   09/05/22 Nustep level 5 x 6 minutes Leg press 20# 3x10 Feet on ball K2C, trunk rotation, small bridge, isometric abs Passive stretch HS, ITB STM to the right rhomboids and teres and the left posterior knee Green tband clamshells Ionto patch 35mA left medial posterior knee  09/02/22 Nustep level 5 x 6 minutes 10# straight arm pulls Seated row 20# Lats 20# Leg press 20# 3x10 Feet on ball K2C, trunk rotaiton, small bridge and iso abs Passive stretch ITB and HS on the left some focus on calf and posterior knee stretches STM to the above Ionto 8mA dose left posterior HS insertion  08/31/22 Nustep level 5 x 6 minutes LEg press 20# 2x10 Feet on ball K2C, trunk rotation, small bridge and isometric abs 10# straight arm pulls 2x10 Seated row 20# 2x10 LAts 20# 2x10 Passive stretch, STM to the HS Ionto 51mA dose left posterior knee HS insertion  PATIENT EDUCATION:  Education details: POC, HEP Person educated: Patient Education method: Consulting civil engineer, Demonstration, Tactile cues, and Verbal cues Education comprehension: verbalized understanding  HOME EXERCISE PROGRAM: Low load long duration HS stretch, foot  propped up while sitting  ASSESSMENT:  CLINICAL IMPRESSION: NPatient had some right scapular pain that she reported after the last treatment,  seems to be in the rhomboid and teres, I worked on this today and it seemed to loosen up well.  She is moving better and appears to be in less pain, she is very tight posterior left knee OBJECTIVE IMPAIRMENTS: Abnormal gait, cardiopulmonary status limiting activity, decreased activity tolerance, decreased endurance, decreased mobility, difficulty walking, decreased ROM, decreased strength, increased fascial restrictions, increased muscle spasms, impaired flexibility, and pain.   REHAB POTENTIAL: Good  CLINICAL DECISION MAKING: Stable/uncomplicated  EVALUATION COMPLEXITY: Low   GOALS: Goals reviewed with patient? Yes  SHORT TERM GOALS: Target date: 09/05/22 Independent with initial HEP Goal status: met LONG TERM GOALS: Target date: 10/23/22  Independent with advanced HEP Goal status: INITIAL  2.  Increase active left knee extension to 10 degrees from fully straight Goal status: INITIAL  3.  Be able to walk 2 minutes without pain >4/10 Goal status: INITIAL  4.  Report pain overall with ADL's decreased 50% Goal status: INITIAL  5.  Decrease TUG time to 12 seconds Goal status: INITIAL  PLAN:  PT FREQUENCY: 1-2x/week  PT DURATION: 12 weeks  PLANNED INTERVENTIONS: Therapeutic exercises, Therapeutic activity, Neuromuscular re-education, Balance training, Gait training, Patient/Family education, Self Care, Joint mobilization, Joint manipulation, Stair training, Electrical stimulation, Cryotherapy, Moist heat, Taping, Vasopneumatic device, Ultrasound, Ionotophoresis 4mg /ml Dexamethasone, and Manual therapy  PLAN FOR NEXT SESSION:slowly progress  Sumner Boast, PT 09/05/2022, 4:15 PM

## 2022-09-07 ENCOUNTER — Ambulatory Visit: Payer: Medicare PPO | Admitting: Physical Therapy

## 2022-09-07 ENCOUNTER — Encounter: Payer: Self-pay | Admitting: Physical Therapy

## 2022-09-07 DIAGNOSIS — M25562 Pain in left knee: Secondary | ICD-10-CM | POA: Diagnosis not present

## 2022-09-07 DIAGNOSIS — R262 Difficulty in walking, not elsewhere classified: Secondary | ICD-10-CM

## 2022-09-07 NOTE — Therapy (Signed)
OUTPATIENT PHYSICAL THERAPY LOWER EXTREMITY TREATMENT   Patient Name: Rhonda Gregory MRN: [id number] DOB:1940-11-25, 82 y.o., female Today's Date: 09/07/2022  END OF SESSION:  PT End of Session - 09/07/22 1428     Visit Number 5    Number of Visits 12    Date for PT Re-Evaluation 10/22/22    Authorization Type [employer]    PT Start Time 1428    PT Stop Time 1519    PT Time Calculation (min) 51 min    Activity Tolerance Patient tolerated treatment well    Behavior During Therapy WFL for tasks assessed/performed             Past Medical History:  Diagnosis Date   Arthritis    Colon polyps    Diverticulosis    Gastric ulcer    Heartburn    HLD (hyperlipidemia)    Hypertension    PONV (postoperative nausea and vomiting)    Sleep apnea    wears CPAP   Uterine cancer ([place]) 1995   Past Surgical History:  Procedure Laterality Date   ABDOMINAL HYSTERECTOMY     with salpingooophorectomy   CATARACT EXTRACTION, BILATERAL Bilateral 2019   COLONOSCOPY W/ POLYPECTOMY     DUPUYTREN CONTRACTURE RELEASE Bilateral    HEMORRHOID SURGERY     KNEE ARTHROSCOPY Right    SUPERFICIAL PERONEAL NERVE RELEASE Right 08/05/2019   Procedure: Right peroneal nerve decompression;  Surgeon: Gaynelle Arabian, MD;  Location: [facility];  Service: Orthopedics;  Laterality: Right;  34min   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   Procedure: RIGHT TOTAL KNEE ARTHROPLASTY;  Surgeon: [name], MD;  Location: [facility];  Service: Orthopedics;  Laterality: Right;   Patient Active Problem List   Diagnosis Date Noted   Peroneal mononeuropathy, 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   History of uterine cancer 09/19/2013   Diverticulosis of colon without hemorrhage 09/19/2013   Postoperative anemia due to acute blood loss 08/13/2013   OA (osteoarthritis) of knee 08/12/2013    PCP: Shon Baton, MD  REFERRING PROVIDER: Coralie Keens, PA  REFERRING DIAG: left knee pain, HS tendonitis  THERAPY DIAG:  Acute pain of left  knee  Difficulty in walking, not elsewhere classified  Rationale for Evaluation and Treatment: Rehabilitation  ONSET DATE: 07/22/22  SUBJECTIVE:   SUBJECTIVE STATEMENT: I continue to feel better, some back pain and some difficulty with the knee during walking.  PERTINENT HISTORY: Right TKA PAIN:  Are you having pain? Yes: NPRS scale: 2/10 Pain location: left posterior knee Pain description: gnawing Aggravating factors: standing, walking pain up to 9/10 Relieving factors: rest, heat tylenol pain can be 0/10  PRECAUTIONS: None  WEIGHT BEARING RESTRICTIONS: No  FALLS:  Has patient fallen in last 6 months? No  LIVING ENVIRONMENT: Lives with: lives with their family and lives with their spouse Lives in: House/apartment Stairs: Yes: Internal: 12 steps; can reach both Has following equipment at home:  has bilateral walking sticks  OCCUPATION: retired  PLOF:  did not use anything for walking, 3 classes a wee for yoga, 2 classes a week for strength and stretching , some walking  PATIENT GOALS: have less pain, be able to go on a trip in April  NEXT MD VISIT: none scheduled  OBJECTIVE:   DIAGNOSTIC FINDINGS: x-rays negative  PATIENT SURVEYS:  FOTO 28  COGNITION: Overall cognitive status: Within functional limits for tasks assessed     SENSATION: WFL  EDEMA:  normal  MUSCLE LENGTH: Hamstrings: Right 60 deg; Left 40 deg pain  Very tight HS and Calf  POSTURE: rounded shoulders and forward head  PALPATION: She is very tender medial HS insertion  LOWER EXTREMITY ROM:  Active ROM Right eval Left eval Left 09/07/22  Hip flexion     Hip extension     Hip abduction     Hip adduction     Hip internal rotation     Hip external rotation     Knee flexion  91 105  Knee extension  20 12  Ankle dorsiflexion     Ankle plantarflexion     Ankle inversion     Ankle eversion      (Blank rows = not tested)  LOWER EXTREMITY MMT:  MMT Right eval Left eval  Hip  flexion    Hip extension    Hip abduction    Hip adduction    Hip internal rotation    Hip external rotation    Knee flexion  3+  Knee extension  4  Ankle dorsiflexion    Ankle plantarflexion    Ankle inversion    Ankle eversion     (Blank rows = not tested) FUNCTIONAL TESTS:  5 times sit to stand: 19 seconds Timed up and go (TUG): 15 seconds 2 minute walk test: able to do 1 minute 6 seconds before pain up to 9/10 did 200 feet  GAIT: Distance walked: 200 feet Assistive device utilized:  uses 2 walking sticks Level of assistance: Complete Independence Comments: knees are in flexion, limp on the left    TODAY'S TREATMENT:                                                                                                                              DATE:   09/07/22 Nustep level 5 x 6 minutes Patient brought in a number of things in for me to adjust for her, she had walking sticks that she could not adjust, I was able to adjust these. Calf stretches Leg press 20# 3x10 Feet on ball K2C, trunk rotation, small bridges, isometric abs Passive HS, ITB stretches STM to the posterior knee Ionto left posterior knee  09/05/22 Nustep level 5 x 6 minutes Leg press 20# 3x10 Feet on ball K2C, trunk rotation, small bridge, isometric abs Passive stretch HS, ITB STM to the right rhomboids and teres and the left posterior knee Green tband clamshells Ionto patch 35mA left medial posterior knee  09/02/22 Nustep level 5 x 6 minutes 10# straight arm pulls Seated row 20# Lats 20# Leg press 20# 3x10 Feet on ball K2C, trunk rotaiton, small bridge and iso abs Passive stretch ITB and HS on the left some focus on calf and posterior knee stretches STM to the above Ionto 52mA dose left posterior HS insertion  08/31/22 Nustep level 5 x 6 minutes LEg press 20# 2x10 Feet on ball K2C, trunk rotation, small bridge and isometric abs 10# straight arm pulls 2x10 Seated row 20# 2x10 LAts 20#  2x10  Passive stretch, STM to the HS Ionto 61mA dose left posterior knee HS insertion  PATIENT EDUCATION:  Education details: POC, HEP Person educated: Patient Education method: Consulting civil engineer, Demonstration, Tactile cues, and Verbal cues Education comprehension: verbalized understanding  HOME EXERCISE PROGRAM: Low load long duration HS stretch, foot propped up while sitting  ASSESSMENT:  CLINICAL IMPRESSION: Patient continues to improve, great increase in ROM, she brought in a lot of things that she is using she needed the walking sticks to be adjusted and could not do it.  Still tender in the posterior knee and the right scapular area OBJECTIVE IMPAIRMENTS: Abnormal gait, cardiopulmonary status limiting activity, decreased activity tolerance, decreased endurance, decreased mobility, difficulty walking, decreased ROM, decreased strength, increased fascial restrictions, increased muscle spasms, impaired flexibility, and pain.   REHAB POTENTIAL: Good  CLINICAL DECISION MAKING: Stable/uncomplicated  EVALUATION COMPLEXITY: Low   GOALS: Goals reviewed with patient? Yes  SHORT TERM GOALS: Target date: 09/05/22 Independent with initial HEP Goal status: met LONG TERM GOALS: Target date: 10/23/22  Independent with advanced HEP Goal status: INITIAL  2.  Increase active left knee extension to 10 degrees from fully straight Goal status: progressing 09/07/22  3.  Be able to walk 2 minutes without pain >4/10 Goal status: INITIAL  4.  Report pain overall with ADL's decreased 50% Goal status: INITIAL  5.  Decrease TUG time to 12 seconds Goal status: INITIAL  PLAN:  PT FREQUENCY: 1-2x/week  PT DURATION: 12 weeks  PLANNED INTERVENTIONS: Therapeutic exercises, Therapeutic activity, Neuromuscular re-education, Balance training, Gait training, Patient/Family education, Self Care, Joint mobilization, Joint manipulation, Stair training, Electrical stimulation, Cryotherapy, Moist heat,  Taping, Vasopneumatic device, Ultrasound, Ionotophoresis 4mg /ml Dexamethasone, and Manual therapy  PLAN FOR NEXT SESSION:slowly progress  Sumner Boast, PT 09/07/2022, 2:34 PM

## 2022-09-12 ENCOUNTER — Ambulatory Visit: Payer: Medicare PPO | Admitting: Physical Therapy

## 2022-09-12 ENCOUNTER — Encounter: Payer: Self-pay | Admitting: Physical Therapy

## 2022-09-12 DIAGNOSIS — R262 Difficulty in walking, not elsewhere classified: Secondary | ICD-10-CM | POA: Diagnosis not present

## 2022-09-12 DIAGNOSIS — M25562 Pain in left knee: Secondary | ICD-10-CM

## 2022-09-12 NOTE — Therapy (Signed)
OUTPATIENT PHYSICAL THERAPY LOWER EXTREMITY TREATMENT   Patient Name: Rhonda Gregory MRN: [id number] DOB:1941-04-06, 82 y.o., female Today's Date: 09/12/2022  END OF SESSION:  PT End of Session - 09/12/22 0753     Visit Number 6    Number of Visits 12    Date for PT Re-Evaluation 10/22/22    Authorization Type [employer]    PT Start Time [phone]    PT Stop Time 0840    PT Time Calculation (min) 46 min    Activity Tolerance Patient tolerated treatment well    Behavior During Therapy WFL for tasks assessed/performed             Past Medical History:  Diagnosis Date   Arthritis    Colon polyps    Diverticulosis    Gastric ulcer    Heartburn    HLD (hyperlipidemia)    Hypertension    PONV (postoperative nausea and vomiting)    Sleep apnea    wears CPAP   Uterine cancer ([place]) 1995   Past Surgical History:  Procedure Laterality Date   ABDOMINAL HYSTERECTOMY     with salpingooophorectomy   CATARACT EXTRACTION, BILATERAL Bilateral 2019   COLONOSCOPY W/ POLYPECTOMY     DUPUYTREN CONTRACTURE RELEASE Bilateral    HEMORRHOID SURGERY     KNEE ARTHROSCOPY Right    SUPERFICIAL PERONEAL NERVE RELEASE Right 08/05/2019   Procedure: Right peroneal nerve decompression;  Surgeon: Gaynelle Arabian, MD;  Location: [facility];  Service: Orthopedics;  Laterality: Right;  14min   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   Procedure: RIGHT TOTAL KNEE ARTHROPLASTY;  Surgeon: [name], MD;  Location: [facility];  Service: Orthopedics;  Laterality: Right;   Patient Active Problem List   Diagnosis Date Noted   Peroneal mononeuropathy, 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   History of uterine cancer 09/19/2013   Diverticulosis of colon without hemorrhage 09/19/2013   Postoperative anemia due to acute blood loss 08/13/2013   OA (osteoarthritis) of knee 08/12/2013    PCP: Shon Baton, MD  REFERRING PROVIDER: Coralie Keens, PA  REFERRING DIAG: left knee pain, HS tendonitis  THERAPY DIAG:  Acute pain of left  knee  Difficulty in walking, not elsewhere classified  Rationale for Evaluation and Treatment: Rehabilitation  ONSET DATE: 07/22/22  SUBJECTIVE:   SUBJECTIVE STATEMENT: Patient has been having some right scapular pain after doing the lats a week ago, she does reports that this is a common problem that she has had.  She feels the knee is better to some extent, she reports that at times she is not using the sticks. But did have difficulty walking up a slant  PERTINENT HISTORY: Right TKA PAIN:  Are you having pain? Yes: NPRS scale: 2/10 Pain location: left posterior knee Pain description: gnawing Aggravating factors: standing, walking pain up to 9/10 Relieving factors: rest, heat tylenol pain can be 0/10  PRECAUTIONS: None  WEIGHT BEARING RESTRICTIONS: No  FALLS:  Has patient fallen in last 6 months? No  LIVING ENVIRONMENT: Lives with: lives with their family and lives with their spouse Lives in: House/apartment Stairs: Yes: Internal: 12 steps; can reach both Has following equipment at home:  has bilateral walking sticks  OCCUPATION: retired  PLOF:  did not use anything for walking, 3 classes a wee for yoga, 2 classes a week for strength and stretching , some walking  PATIENT GOALS: have less pain, be able to go on a trip in April  NEXT MD VISIT: none scheduled  OBJECTIVE:   DIAGNOSTIC  FINDINGS: x-rays negative  PATIENT SURVEYS:  FOTO 28  COGNITION: Overall cognitive status: Within functional limits for tasks assessed     SENSATION: WFL  EDEMA:  normal  MUSCLE LENGTH: Hamstrings: Right 60 deg; Left 40 deg pain Very tight HS and Calf  POSTURE: rounded shoulders and forward head  PALPATION: She is very tender medial HS insertion  LOWER EXTREMITY ROM:  Active ROM Right eval Left eval Left 09/07/22  Hip flexion     Hip extension     Hip abduction     Hip adduction     Hip internal rotation     Hip external rotation     Knee flexion  91 105  Knee  extension  20 12  Ankle dorsiflexion     Ankle plantarflexion     Ankle inversion     Ankle eversion      (Blank rows = not tested)  LOWER EXTREMITY MMT:  MMT Right eval Left eval  Hip flexion    Hip extension    Hip abduction    Hip adduction    Hip internal rotation    Hip external rotation    Knee flexion  3+  Knee extension  4  Ankle dorsiflexion    Ankle plantarflexion    Ankle inversion    Ankle eversion     (Blank rows = not tested) FUNCTIONAL TESTS:  5 times sit to stand: 19 seconds Timed up and go (TUG): 15 seconds 2 minute walk test: able to do 1 minute 6 seconds before pain up to 9/10 did 200 feet  GAIT: Distance walked: 200 feet Assistive device utilized:  uses 2 walking sticks Level of assistance: Complete Independence Comments: knees are in flexion, limp on the left    TODAY'S TREATMENT:                                                                                                                              DATE:   09/12/22 Nustep level 5 x 6 minutes Leg extension 5# focus on TKE LEg  curls 15# 2x10 Leg press 20# 3x10 5# straight arm pulls Passive HS, piriformis and ITB STM to the posterior knee Ionto 51mA dose dexamethasone to the posterior left knee  09/07/22 Nustep level 5 x 6 minutes Patient brought in a number of things in for me to adjust for her, she had walking sticks that she could not adjust, I was able to adjust these. Calf stretches Leg press 20# 3x10 Feet on ball K2C, trunk rotation, small bridges, isometric abs Passive HS, ITB stretches STM to the posterior knee Ionto left posterior knee  09/05/22 Nustep level 5 x 6 minutes Leg press 20# 3x10 Feet on ball K2C, trunk rotation, small bridge, isometric abs Passive stretch HS, ITB STM to the right rhomboids and teres and the left posterior knee Green tband clamshells Ionto patch 49mA left medial posterior knee  09/02/22 Nustep level 5 x 6 minutes  10# straight arm  pulls Seated row 20# Lats 20# Leg press 20# 3x10 Feet on ball K2C, trunk rotaiton, small bridge and iso abs Passive stretch ITB and HS on the left some focus on calf and posterior knee stretches STM to the above Ionto 14mA dose left posterior HS insertion  08/31/22 Nustep level 5 x 6 minutes LEg press 20# 2x10 Feet on ball K2C, trunk rotation, small bridge and isometric abs 10# straight arm pulls 2x10 Seated row 20# 2x10 LAts 20# 2x10 Passive stretch, STM to the HS Ionto 78mA dose left posterior knee HS insertion  PATIENT EDUCATION:  Education details: POC, HEP Person educated: Patient Education method: Consulting civil engineer, Demonstration, Tactile cues, and Verbal cues Education comprehension: verbalized understanding  HOME EXERCISE PROGRAM: Low load long duration HS stretch, foot propped up while sitting  ASSESSMENT:  CLINICAL IMPRESSION: Patient continues to improve, she reports walking some without canes, but did report that she had a lot of pain and difficulty going up a slope last Friday. OBJECTIVE IMPAIRMENTS: Abnormal gait, cardiopulmonary status limiting activity, decreased activity tolerance, decreased endurance, decreased mobility, difficulty walking, decreased ROM, decreased strength, increased fascial restrictions, increased muscle spasms, impaired flexibility, and pain.   REHAB POTENTIAL: Good  CLINICAL DECISION MAKING: Stable/uncomplicated  EVALUATION COMPLEXITY: Low   GOALS: Goals reviewed with patient? Yes  SHORT TERM GOALS: Target date: 09/05/22 Independent with initial HEP Goal status: met LONG TERM GOALS: Target date: 10/23/22  Independent with advanced HEP Goal status: progressing  2.  Increase active left knee extension to 10 degrees from fully straight Goal status: progressing 09/07/22  3.  Be able to walk 2 minutes without pain >4/10 Goal status: INITIAL  4.  Report pain overall with ADL's decreased 50% Goal status: INITIAL  5.  Decrease TUG time  to 12 seconds Goal status: INITIAL  PLAN:  PT FREQUENCY: 1-2x/week  PT DURATION: 12 weeks  PLANNED INTERVENTIONS: Therapeutic exercises, Therapeutic activity, Neuromuscular re-education, Balance training, Gait training, Patient/Family education, Self Care, Joint mobilization, Joint manipulation, Stair training, Electrical stimulation, Cryotherapy, Moist heat, Taping, Vasopneumatic device, Ultrasound, Ionotophoresis 4mg /ml Dexamethasone, and Manual therapy  PLAN FOR NEXT SESSION:slowly progress  Sumner Boast, PT 09/12/2022, 7:54 AM

## 2022-09-14 ENCOUNTER — Ambulatory Visit: Payer: Medicare PPO | Admitting: Physical Therapy

## 2022-09-14 ENCOUNTER — Encounter: Payer: Self-pay | Admitting: Physical Therapy

## 2022-09-14 DIAGNOSIS — R262 Difficulty in walking, not elsewhere classified: Secondary | ICD-10-CM

## 2022-09-14 DIAGNOSIS — M25562 Pain in left knee: Secondary | ICD-10-CM

## 2022-09-14 NOTE — Therapy (Signed)
OUTPATIENT PHYSICAL THERAPY LOWER EXTREMITY TREATMENT   Patient Name: Rhonda Gregory MRN: [id number] DOB:1941/02/25, 82 y.o., female Today's Date: 09/14/2022  END OF SESSION:  PT End of Session - 09/14/22 0757     Visit Number 7    Number of Visits 12    Date for PT Re-Evaluation 10/22/22    Authorization Type [employer]    PT Start Time 0757    PT Stop Time 0840    PT Time Calculation (min) 43 min    Activity Tolerance Patient tolerated treatment well    Behavior During Therapy WFL for tasks assessed/performed             Past Medical History:  Diagnosis Date   Arthritis    Colon polyps    Diverticulosis    Gastric ulcer    Heartburn    HLD (hyperlipidemia)    Hypertension    PONV (postoperative nausea and vomiting)    Sleep apnea    wears CPAP   Uterine cancer ([place]) 1995   Past Surgical History:  Procedure Laterality Date   ABDOMINAL HYSTERECTOMY     with salpingooophorectomy   CATARACT EXTRACTION, BILATERAL Bilateral 2019   COLONOSCOPY W/ POLYPECTOMY     DUPUYTREN CONTRACTURE RELEASE Bilateral    HEMORRHOID SURGERY     KNEE ARTHROSCOPY Right    SUPERFICIAL PERONEAL NERVE RELEASE Right 08/05/2019   Procedure: Right peroneal nerve decompression;  Surgeon: Gaynelle Arabian, MD;  Location: [facility];  Service: Orthopedics;  Laterality: Right;  25min   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   Procedure: RIGHT TOTAL KNEE ARTHROPLASTY;  Surgeon: [name], MD;  Location: [facility];  Service: Orthopedics;  Laterality: Right;   Patient Active Problem List   Diagnosis Date Noted   Peroneal mononeuropathy, 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   History of uterine cancer 09/19/2013   Diverticulosis of colon without hemorrhage 09/19/2013   Postoperative anemia due to acute blood loss 08/13/2013   OA (osteoarthritis) of knee 08/12/2013    PCP: Shon Baton, MD  REFERRING PROVIDER: Coralie Keens, PA  REFERRING DIAG: left knee pain, HS tendonitis  THERAPY DIAG:  Acute pain of left  knee  Difficulty in walking, not elsewhere classified  Rationale for Evaluation and Treatment: Rehabilitation  ONSET DATE: 07/22/22  SUBJECTIVE:   SUBJECTIVE STATEMENT: Patient continues to have the right upper back pain a 5/10, still with some left posterior knee pain.  Reports that she was able to vacuum yesterday, will have an injection in the knee today  PERTINENT HISTORY: Right TKA PAIN:  Are you having pain? Yes: NPRS scale: 2/10 Pain location: left posterior knee Pain description: gnawing Aggravating factors: standing, walking pain up to 9/10 Relieving factors: rest, heat tylenol pain can be 0/10  PRECAUTIONS: None  WEIGHT BEARING RESTRICTIONS: No  FALLS:  Has patient fallen in last 6 months? No  LIVING ENVIRONMENT: Lives with: lives with their family and lives with their spouse Lives in: House/apartment Stairs: Yes: Internal: 12 steps; can reach both Has following equipment at home:  has bilateral walking sticks  OCCUPATION: retired  PLOF:  did not use anything for walking, 3 classes a wee for yoga, 2 classes a week for strength and stretching , some walking  PATIENT GOALS: have less pain, be able to go on a trip in April  NEXT MD VISIT: none scheduled  OBJECTIVE:   DIAGNOSTIC FINDINGS: x-rays negative  PATIENT SURVEYS:  FOTO 28  COGNITION: Overall cognitive status: Within functional limits for tasks assessed  SENSATION: WFL  EDEMA:  normal  MUSCLE LENGTH: Hamstrings: Right 60 deg; Left 40 deg pain Very tight HS and Calf  POSTURE: rounded shoulders and forward head  PALPATION: She is very tender medial HS insertion  LOWER EXTREMITY ROM:  Active ROM Right eval Left eval Left 09/07/22  Hip flexion     Hip extension     Hip abduction     Hip adduction     Hip internal rotation     Hip external rotation     Knee flexion  91 105  Knee extension  20 12  Ankle dorsiflexion     Ankle plantarflexion     Ankle inversion     Ankle  eversion      (Blank rows = not tested)  LOWER EXTREMITY MMT:  MMT Right eval Left eval  Hip flexion    Hip extension    Hip abduction    Hip adduction    Hip internal rotation    Hip external rotation    Knee flexion  3+  Knee extension  4  Ankle dorsiflexion    Ankle plantarflexion    Ankle inversion    Ankle eversion     (Blank rows = not tested) FUNCTIONAL TESTS:  5 times sit to stand: 19 seconds Timed up and go (TUG): 15 seconds 2 minute walk test: able to do 1 minute 6 seconds before pain up to 9/10 did 200 feet  GAIT: Distance walked: 200 feet Assistive device utilized:  uses 2 walking sticks Level of assistance: Complete Independence Comments: knees are in flexion, limp on the left    TODAY'S TREATMENT:                                                                                                                              DATE:   09/14/22 Nustep level 5 x 6 minutes Leg curls 15# 2x12 Leg extension 5# 2x10 Calf stretches LEg press 20# 2x12 Calf press 20# 2x10 Bike level 3 x 5 minutes Feet on ball K2C, trunk rotation,bridge and iso abs Passive stretch to the left knee, STM to posterior knee  09/12/22 Nustep level 5 x 6 minutes Leg extension 5# focus on TKE LEg  curls 15# 2x10 Leg press 20# 3x10 5# straight arm pulls Passive HS, piriformis and ITB STM to the posterior knee Ionto 19mA dose dexamethasone to the posterior left knee  09/07/22 Nustep level 5 x 6 minutes Patient brought in a number of things in for me to adjust for her, she had walking sticks that she could not adjust, I was able to adjust these. Calf stretches Leg press 20# 3x10 Feet on ball K2C, trunk rotation, small bridges, isometric abs Passive HS, ITB stretches STM to the posterior knee Ionto left posterior knee  09/05/22 Nustep level 5 x 6 minutes Leg press 20# 3x10 Feet on ball K2C, trunk rotation, small bridge, isometric abs Passive stretch HS, ITB STM to the  right  rhomboids and teres and the left posterior knee Green tband clamshells Ionto patch 36mA left medial posterior knee  09/02/22 Nustep level 5 x 6 minutes 10# straight arm pulls Seated row 20# Lats 20# Leg press 20# 3x10 Feet on ball K2C, trunk rotaiton, small bridge and iso abs Passive stretch ITB and HS on the left some focus on calf and posterior knee stretches STM to the above Ionto 62mA dose left posterior HS insertion  08/31/22 Nustep level 5 x 6 minutes LEg press 20# 2x10 Feet on ball K2C, trunk rotation, small bridge and isometric abs 10# straight arm pulls 2x10 Seated row 20# 2x10 LAts 20# 2x10 Passive stretch, STM to the HS Ionto 53mA dose left posterior knee HS insertion  PATIENT EDUCATION:  Education details: POC, HEP Person educated: Patient Education method: Consulting civil engineer, Demonstration, Tactile cues, and Verbal cues Education comprehension: verbalized understanding  HOME EXERCISE PROGRAM: Low load long duration HS stretch, foot propped up while sitting  ASSESSMENT:  CLINICAL IMPRESSION: Patient continues to improve, she was able to vacuum yesterday.  She is moving much better, tends to avoid pain with stretches for knee extension and or HS.  She will have knee injected today OBJECTIVE IMPAIRMENTS: Abnormal gait, cardiopulmonary status limiting activity, decreased activity tolerance, decreased endurance, decreased mobility, difficulty walking, decreased ROM, decreased strength, increased fascial restrictions, increased muscle spasms, impaired flexibility, and pain.   REHAB POTENTIAL: Good  CLINICAL DECISION MAKING: Stable/uncomplicated  EVALUATION COMPLEXITY: Low   GOALS: Goals reviewed with patient? Yes  SHORT TERM GOALS: Target date: 09/05/22 Independent with initial HEP Goal status: met LONG TERM GOALS: Target date: 10/23/22  Independent with advanced HEP Goal status: progressing  2.  Increase active left knee extension to 10 degrees from fully  straight Goal status: progressing 09/07/22  3.  Be able to walk 2 minutes without pain >4/10 Goal status: progressing  4.  Report pain overall with ADL's decreased 50% Goal status:progressing  5.  Decrease TUG time to 12 seconds Goal status:progressing  PLAN:  PT FREQUENCY: 1-2x/week  PT DURATION: 12 weeks  PLANNED INTERVENTIONS: Therapeutic exercises, Therapeutic activity, Neuromuscular re-education, Balance training, Gait training, Patient/Family education, Self Care, Joint mobilization, Joint manipulation, Stair training, Electrical stimulation, Cryotherapy, Moist heat, Taping, Vasopneumatic device, Ultrasound, Ionotophoresis 4mg /ml Dexamethasone, and Manual therapy  PLAN FOR NEXT SESSION:  She will go on vacation so will be gone over the next two weeks  Laila Myhre W, PT 09/14/2022, 7:58 AM

## 2022-09-15 DIAGNOSIS — M5416 Radiculopathy, lumbar region: Secondary | ICD-10-CM | POA: Diagnosis not present

## 2022-09-16 DIAGNOSIS — G4733 Obstructive sleep apnea (adult) (pediatric): Secondary | ICD-10-CM | POA: Diagnosis not present

## 2022-10-03 ENCOUNTER — Ambulatory Visit: Payer: Medicare PPO | Admitting: Physical Therapy

## 2022-10-06 ENCOUNTER — Encounter: Payer: Self-pay | Admitting: Physical Therapy

## 2022-10-06 ENCOUNTER — Ambulatory Visit: Payer: Medicare PPO | Attending: Student | Admitting: Physical Therapy

## 2022-10-06 DIAGNOSIS — M25562 Pain in left knee: Secondary | ICD-10-CM | POA: Insufficient documentation

## 2022-10-06 DIAGNOSIS — R262 Difficulty in walking, not elsewhere classified: Secondary | ICD-10-CM

## 2022-10-06 NOTE — Therapy (Signed)
OUTPATIENT PHYSICAL THERAPY LOWER EXTREMITY TREATMENT   Patient Name: Rhonda Gregory MRN: [id number] DOB:May 17, 1941, 82 y.o., female Today's Date: 10/06/2022  END OF SESSION:  PT End of Session - 10/06/22 1312     Visit Number 8    Number of Visits 12    Date for PT Re-Evaluation 10/22/22    Authorization Type [employer]    PT Start Time 1312    PT Stop Time 1355    PT Time Calculation (min) 43 min    Activity Tolerance Patient tolerated treatment well    Behavior During Therapy WFL for tasks assessed/performed             Past Medical History:  Diagnosis Date   Arthritis    Colon polyps    Diverticulosis    Gastric ulcer    Heartburn    HLD (hyperlipidemia)    Hypertension    PONV (postoperative nausea and vomiting)    Sleep apnea    wears CPAP   Uterine cancer 1995   Past Surgical History:  Procedure Laterality Date   ABDOMINAL HYSTERECTOMY     with salpingooophorectomy   CATARACT EXTRACTION, BILATERAL Bilateral 2019   COLONOSCOPY W/ POLYPECTOMY     DUPUYTREN CONTRACTURE RELEASE Bilateral    HEMORRHOID SURGERY     KNEE ARTHROSCOPY Right    SUPERFICIAL PERONEAL NERVE RELEASE Right 08/05/2019   Procedure: Right peroneal nerve decompression;  Surgeon: Ollen Gross, MD;  Location: [facility];  Service: Orthopedics;  Laterality: Right;    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   Procedure: RIGHT TOTAL KNEE ARTHROPLASTY;  Surgeon: Loanne Drilling, MD;  Location: [facility];  Service: Orthopedics;  Laterality: Right;   Patient Active Problem List   Diagnosis Date Noted   Peroneal mononeuropathy, 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   History of uterine cancer 09/19/2013   Diverticulosis of colon without hemorrhage 09/19/2013   Postoperative anemia due to acute blood loss 08/13/2013   OA (osteoarthritis) of knee 08/12/2013    PCP: Creola Corn, MD  REFERRING PROVIDER: Marolyn Haller, PA  REFERRING DIAG: left knee pain, HS tendonitis  THERAPY DIAG:  Acute pain of left  knee  Difficulty in walking, not elsewhere classified  Rationale for Evaluation and Treatment: Rehabilitation  ONSET DATE: 07/22/22  SUBJECTIVE:   SUBJECTIVE STATEMENT: Patient went on vacation reports that she did well her knee held up well, reports that she has really stopped using hte walking sticks, still some pain in the knee and the back  PERTINENT HISTORY: Right TKA PAIN:  Are you having pain? Yes: NPRS scale: 2/10 Pain location: left posterior knee Pain description: gnawing Aggravating factors: standing, walking pain up to 9/10 Relieving factors: rest, heat tylenol pain can be 0/10  PRECAUTIONS: None  WEIGHT BEARING RESTRICTIONS: No  FALLS:  Has patient fallen in last 6 months? No  LIVING ENVIRONMENT: Lives with: lives with their family and lives with their spouse Lives in: House/apartment Stairs: Yes: Internal: 12 steps; can reach both Has following equipment at home:  has bilateral walking sticks  OCCUPATION: retired  PLOF:  did not use anything for walking, 3 classes a wee for yoga, 2 classes a week for strength and stretching , some walking  PATIENT GOALS: have less pain, be able to go on a trip in April  NEXT MD VISIT: none scheduled  OBJECTIVE:   DIAGNOSTIC FINDINGS: x-rays negative  PATIENT SURVEYS:  FOTO 28  COGNITION: Overall cognitive status: Within functional limits for tasks assessed     SENSATION:  WFL  EDEMA:  normal  MUSCLE LENGTH: Hamstrings: Right 60 deg; Left 40 deg pain Very tight HS and Calf  POSTURE: rounded shoulders and forward head  PALPATION: She is very tender medial HS insertion  LOWER EXTREMITY ROM:  Active ROM Right eval Left eval Left 09/07/22  Hip flexion     Hip extension     Hip abduction     Hip adduction     Hip internal rotation     Hip external rotation     Knee flexion  91 105  Knee extension  20 12  Ankle dorsiflexion     Ankle plantarflexion     Ankle inversion     Ankle eversion       (Blank rows = not tested)  LOWER EXTREMITY MMT:  MMT Right eval Left eval  Hip flexion    Hip extension    Hip abduction    Hip adduction    Hip internal rotation    Hip external rotation    Knee flexion  3+  Knee extension  4  Ankle dorsiflexion    Ankle plantarflexion    Ankle inversion    Ankle eversion     (Blank rows = not tested) FUNCTIONAL TESTS:  5 times sit to stand: 19 seconds Timed up and go (TUG): 15 seconds 2 minute walk test: able to do 1 minute 6 seconds before pain up to 9/10 did 200 feet  GAIT: Distance walked: 200 feet Assistive device utilized:  uses 2 walking sticks Level of assistance: Complete Independence Comments: knees are in flexion, limp on the left    TODAY'S TREATMENT:                                                                                                                              DATE:   10/06/22 Bike level 4 x 6 minutes Leg press 20# x10, 30# x10, 40# x10 5# on airex straight arm pulls 20# HS curls 3x10 5# Leg extension 3x10 Walking outside around the parking [employer] on ball K2C, rotation, bridge, isometric abs Passive LE stretches   09/14/22 Nustep level 5 x 6 minutes Leg curls 15# 2x12 Leg extension 5# 2x10 Calf stretches LEg press 20# 2x12 Calf press 20# 2x10 Bike level 3 x 5 minutes Feet on ball K2C, trunk rotation,bridge and iso abs Passive stretch to the left knee, STM to posterior knee  09/12/22 Nustep level 5 x 6 minutes Leg extension 5# focus on TKE LEg  curls 15# 2x10 Leg press 20# 3x10 5# straight arm pulls Passive HS, piriformis and ITB STM to the posterior knee Ionto 80mA dose dexamethasone to the posterior left knee  09/07/22 Nustep level 5 x 6 minutes Patient brought in a number of things in for me to adjust for her, she had walking sticks that she could not adjust, I was able to adjust these. Calf stretches Leg press 20# 3x10 Feet on ball K2C, trunk  rotation, small bridges, isometric  abs Passive HS, ITB stretches STM to the posterior knee Ionto left posterior knee  09/05/22 Nustep level 5 x 6 minutes Leg press 20# 3x10 Feet on ball K2C, trunk rotation, small bridge, isometric abs Passive stretch HS, ITB STM to the right rhomboids and teres and the left posterior knee Green tband clamshells Ionto patch 80mA left medial posterior knee  09/02/22 Nustep level 5 x 6 minutes 10# straight arm pulls Seated row 20# Lats 20# Leg press 20# 3x10 Feet on ball K2C, trunk rotaiton, small bridge and iso abs Passive stretch ITB and HS on the left some focus on calf and posterior knee stretches STM to the above Ionto 80mA dose left posterior HS insertion  08/31/22 Nustep level 5 x 6 minutes LEg press 20# 2x10 Feet on ball K2C, trunk rotation, small bridge and isometric abs 10# straight arm pulls 2x10 Seated row 20# 2x10 LAts 20# 2x10 Passive stretch, STM to the HS Ionto 80mA dose left posterior knee HS insertion  PATIENT EDUCATION:  Education details: POC, HEP Person educated: Patient Education method: Programmer, multimedia, Demonstration, Tactile cues, and Verbal cues Education comprehension: verbalized understanding  HOME EXERCISE PROGRAM: Low load long duration HS stretch, foot propped up while sitting  ASSESSMENT:  CLINICAL IMPRESSION: Patient doing very well, no big issues on the vacation, she does report that walking with me today outside was the farthest she has walked.  Less pain, still some pain with walking and very tight in the mms OBJECTIVE IMPAIRMENTS: Abnormal gait, cardiopulmonary status limiting activity, decreased activity tolerance, decreased endurance, decreased mobility, difficulty walking, decreased ROM, decreased strength, increased fascial restrictions, increased muscle spasms, impaired flexibility, and pain.   REHAB POTENTIAL: Good  CLINICAL DECISION MAKING: Stable/uncomplicated  EVALUATION COMPLEXITY: Low   GOALS: Goals reviewed with patient?  Yes  SHORT TERM GOALS: Target date: 09/05/22 Independent with initial HEP Goal status: met LONG TERM GOALS: Target date: 10/23/22  Independent with advanced HEP Goal status: progressing  2.  Increase active left knee extension to 10 degrees from fully straight Goal status: progressing 09/07/22  3.  Be able to walk 2 minutes without pain >4/10 Goal status: met 10/06/22  4.  Report pain overall with ADL's decreased 50% Goal status:progressing  5.  Decrease TUG time to 12 seconds Goal status:met 10/06/22  PLAN:  PT FREQUENCY: 1-2x/week  PT DURATION: 12 weeks  PLANNED INTERVENTIONS: Therapeutic exercises, Therapeutic activity, Neuromuscular re-education, Balance training, Gait training, Patient/Family education, Self Care, Joint mobilization, Joint manipulation, Stair training, Electrical stimulation, Cryotherapy, Moist heat, Taping, Vasopneumatic device, Ultrasound, Ionotophoresis /ml Dexamethasone, and Manual therapy  PLAN FOR NEXT SESSION:  See her another time or two to maximize her function  Jearld Lesch, PT 10/06/2022, 1:13 PM

## 2022-10-14 ENCOUNTER — Ambulatory Visit: Payer: Medicare PPO | Admitting: Physical Therapy

## 2022-10-14 ENCOUNTER — Encounter: Payer: Self-pay | Admitting: Physical Therapy

## 2022-10-14 DIAGNOSIS — R262 Difficulty in walking, not elsewhere classified: Secondary | ICD-10-CM

## 2022-10-14 DIAGNOSIS — M25562 Pain in left knee: Secondary | ICD-10-CM | POA: Diagnosis not present

## 2022-10-14 NOTE — Therapy (Signed)
OUTPATIENT PHYSICAL THERAPY LOWER EXTREMITY TREATMENT   Patient Name: Rhonda Gregory MRN: [id number] DOB:1940/12/26, 82 y.o., female Today's Date: 10/14/2022  END OF SESSION:  PT End of Session - 10/14/22 0839     Visit Number 9    Date for PT Re-Evaluation 10/22/22    Authorization Type [employer]    PT Start Time [phone]    PT Stop Time 0924    PT Time Calculation (min) 46 min    Activity Tolerance Patient tolerated treatment well    Behavior During Therapy WFL for tasks assessed/performed             Past Medical History:  Diagnosis Date   Arthritis    Colon polyps    Diverticulosis    Gastric ulcer    Heartburn    HLD (hyperlipidemia)    Hypertension    PONV (postoperative nausea and vomiting)    Sleep apnea    wears CPAP   Uterine cancer (HCC) 1995   Past Surgical History:  Procedure Laterality Date   ABDOMINAL HYSTERECTOMY     with salpingooophorectomy   CATARACT EXTRACTION, BILATERAL Bilateral 2019   COLONOSCOPY W/ POLYPECTOMY     DUPUYTREN CONTRACTURE RELEASE Bilateral    HEMORRHOID SURGERY     KNEE ARTHROSCOPY Right    SUPERFICIAL PERONEAL NERVE RELEASE Right 08/05/2019   Procedure: Right peroneal nerve decompression;  Surgeon: Ollen Gross, MD;  Location: [facility];  Service: Orthopedics;  Laterality: Right;    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   Procedure: RIGHT TOTAL KNEE ARTHROPLASTY;  Surgeon: Loanne Drilling, MD;  Location: [facility];  Service: Orthopedics;  Laterality: Right;   Patient Active Problem List   Diagnosis Date Noted   Peroneal mononeuropathy, 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  Has not had to use the walking sticks.  She reports much less pain  PERTINENT HISTORY: Right TKA PAIN:  Are you having pain? Yes: NPRS scale: 0/10 Pain location: left posterior knee Pain description: gnawing Aggravating factors: standing, walking pain up to 9/10 Relieving factors: rest, heat tylenol pain can be 0/10  PRECAUTIONS: None  WEIGHT BEARING RESTRICTIONS: No  FALLS:  Has patient fallen in last 6 months? No  LIVING ENVIRONMENT: Lives with: lives with their family and lives with their spouse Lives in: House/apartment Stairs: Yes: Internal: 12 steps; can reach both Has following equipment at home:  has bilateral walking sticks  OCCUPATION: retired  PLOF:  did not use anything for walking, 3 classes a wee for yoga, 2 classes a week for strength and stretching , some walking  PATIENT GOALS: have less pain, be able to go on a trip in April  NEXT MD VISIT: none scheduled  OBJECTIVE:   DIAGNOSTIC FINDINGS: x-rays negative  PATIENT SURVEYS:  FOTO 28  COGNITION: Overall cognitive status: Within functional limits for tasks assessed     SENSATION: WFL  EDEMA:  normal  MUSCLE LENGTH: Hamstrings: Right 60 deg; Left 40 deg pain Very  tight HS and Calf  POSTURE: rounded shoulders and forward head  PALPATION: She is very tender medial HS insertion  LOWER EXTREMITY ROM:  Active ROM Right eval Left eval Left 09/07/22  Hip flexion     Hip extension     Hip abduction     Hip adduction     Hip internal rotation     Hip external rotation     Knee flexion  91 105  Knee extension  20 12  Ankle dorsiflexion     Ankle plantarflexion     Ankle inversion     Ankle eversion      (Blank rows = not tested)  LOWER EXTREMITY MMT:  MMT Right eval Left eval  Hip flexion    Hip  extension    Hip abduction    Hip adduction    Hip internal rotation    Hip external rotation    Knee flexion  3+  Knee extension  4  Ankle dorsiflexion    Ankle plantarflexion    Ankle inversion    Ankle eversion     (Blank rows = not tested) FUNCTIONAL TESTS:  5 times sit to stand: 19 seconds Timed up and go (TUG): 15 seconds 2 minute walk test: able to do 1 minute 6 seconds before pain up to 9/10 did 200 feet  GAIT: Distance walked: 200 feet Assistive device utilized:  uses 2 walking sticks Level of assistance: Complete Independence Comments: knees are in flexion, limp on the left    TODAY'S TREATMENT:                                                                                                                              DATE:   10/14/22 Nustep level 5 x 6 minutes 20# HS curls 10# rows 15# lats 20# leg press Passive stretch of the LE's Calf stretch Feet on ball K2C, rotation, bridge, iso abs Advanced HEP and gym activities, safety  10/06/22 Bike level 4 x 6 minutes Leg press 20# x10, 30# x10, 40# x10 5# on airex straight arm pulls 20# HS curls 3x10 5# Leg extension 3x10 Walking outside around the parking [employer] on ball K2C, rotation, bridge, isometric abs Passive LE stretches   09/14/22 Nustep level 5 x 6 minutes Leg curls 15# 2x12 Leg extension 5# 2x10 Calf stretches LEg press 20# 2x12 Calf press 20# 2x10 Bike level 3 x 5 minutes Feet on ball K2C, trunk rotation,bridge and iso abs Passive stretch to the left knee, STM to posterior knee  09/12/22 Nustep level 5 x 6 minutes Leg extension 5# focus on TKE LEg  curls 15# 2x10 Leg press 20# 3x10 5# straight arm pulls Passive HS, piriformis and ITB STM to the posterior knee Ionto 80mA dose dexamethasone to the posterior left knee  09/07/22 Nustep level 5 x 6 minutes Patient brought in a number of things in for me to adjust for her, she had walking sticks  that she could not adjust, I was able to  adjust these. Calf stretches Leg press 20# 3x10 Feet on ball K2C, trunk rotation, small bridges, isometric abs Passive HS, ITB stretches STM to the posterior knee Ionto left posterior knee  09/05/22 Nustep level 5 x 6 minutes Leg press 20# 3x10 Feet on ball K2C, trunk rotation, small bridge, isometric abs Passive stretch HS, ITB STM to the right rhomboids and teres and the left posterior knee Green tband clamshells Ionto patch 80mA left medial posterior knee  09/02/22 Nustep level 5 x 6 minutes 10# straight arm pulls Seated row 20# Lats 20# Leg press 20# 3x10 Feet on ball K2C, trunk rotaiton, small bridge and iso abs Passive stretch ITB and HS on the left some focus on calf and posterior knee stretches STM to the above Ionto 80mA dose left posterior HS insertion  08/31/22 Nustep level 5 x 6 minutes LEg press 20# 2x10 Feet on ball K2C, trunk rotation, small bridge and isometric abs 10# straight arm pulls 2x10 Seated row 20# 2x10 LAts 20# 2x10 Passive stretch, STM to the HS Ionto 80mA dose left posterior knee HS insertion  PATIENT EDUCATION:  Education details: POC, HEP Person educated: Patient Education method: Programmer, multimedia, Demonstration, Tactile cues, and Verbal cues Education comprehension: verbalized understanding  HOME EXERCISE PROGRAM: Low load long duration HS stretch, foot propped up while sitting  ASSESSMENT:  CLINICAL IMPRESSION: Patient without any pain, She is walking more and going to the gym, she feels that she knows what to do, we reviewed HEP and gym activities, weight, form, posture.  I cautioned her about doing too much and taking days off, her biggest need is to stretch and she does not like to stretch.   OBJECTIVE IMPAIRMENTS: Abnormal gait, cardiopulmonary status limiting activity, decreased activity tolerance, decreased endurance, decreased mobility, difficulty walking, decreased ROM, decreased strength, increased fascial restrictions, increased  muscle spasms, impaired flexibility, and pain.   REHAB POTENTIAL: Good  CLINICAL DECISION MAKING: Stable/uncomplicated  EVALUATION COMPLEXITY: Low   GOALS: Goals reviewed with patient? Yes  SHORT TERM GOALS: Target date: 09/05/22 Independent with initial HEP Goal status: met LONG TERM GOALS: Target date: 10/23/22  Independent with advanced HEP Goal status: met 10/14/22  2.  Increase active left knee extension to 10 degrees from fully straight Goal status: met 10/14/22  3.  Be able to walk 2 minutes without pain >4/10 Goal status: met 10/06/22  4.  Report pain overall with ADL's decreased 50% Goal status: met 10/14/22  5.  Decrease TUG time to 12 seconds Goal status:met 10/06/22  PLAN:  PT FREQUENCY: 1-2x/week  PT DURATION: 12 weeks  PLANNED INTERVENTIONS: Therapeutic exercises, Therapeutic activity, Neuromuscular re-education, Balance training, Gait training, Patient/Family education, Self Care, Joint mobilization, Joint manipulation, Stair training, Electrical stimulation, Cryotherapy, Moist heat, Taping, Vasopneumatic device, Ultrasound, Ionotophoresis 4mg /ml Dexamethasone, and Manual therapy  PLAN FOR NEXT SESSION:  D/C goals met  Jearld Lesch, PT 10/14/2022, 8:45 AM

## 2022-10-17 DIAGNOSIS — G4733 Obstructive sleep apnea (adult) (pediatric): Secondary | ICD-10-CM | POA: Diagnosis not present

## 2022-10-25 ENCOUNTER — Ambulatory Visit: Payer: Medicare PPO | Admitting: Adult Health

## 2022-11-08 DIAGNOSIS — R739 Hyperglycemia, unspecified: Secondary | ICD-10-CM | POA: Diagnosis not present

## 2022-11-08 DIAGNOSIS — E785 Hyperlipidemia, unspecified: Secondary | ICD-10-CM | POA: Diagnosis not present

## 2022-11-08 DIAGNOSIS — K219 Gastro-esophageal reflux disease without esophagitis: Secondary | ICD-10-CM | POA: Diagnosis not present

## 2022-11-08 DIAGNOSIS — I10 Essential (primary) hypertension: Secondary | ICD-10-CM | POA: Diagnosis not present

## 2022-11-15 DIAGNOSIS — Z0000 Encounter for general adult medical examination without abnormal findings: Secondary | ICD-10-CM | POA: Diagnosis not present

## 2022-11-15 DIAGNOSIS — I10 Essential (primary) hypertension: Secondary | ICD-10-CM | POA: Diagnosis not present

## 2022-11-15 DIAGNOSIS — Z1231 Encounter for screening mammogram for malignant neoplasm of breast: Secondary | ICD-10-CM | POA: Diagnosis not present

## 2022-11-15 DIAGNOSIS — G4733 Obstructive sleep apnea (adult) (pediatric): Secondary | ICD-10-CM | POA: Diagnosis not present

## 2022-11-15 DIAGNOSIS — F439 Reaction to severe stress, unspecified: Secondary | ICD-10-CM | POA: Diagnosis not present

## 2022-11-15 DIAGNOSIS — Z1331 Encounter for screening for depression: Secondary | ICD-10-CM | POA: Diagnosis not present

## 2022-11-15 DIAGNOSIS — R82998 Other abnormal findings in urine: Secondary | ICD-10-CM | POA: Diagnosis not present

## 2022-11-15 DIAGNOSIS — E785 Hyperlipidemia, unspecified: Secondary | ICD-10-CM | POA: Diagnosis not present

## 2022-11-15 DIAGNOSIS — M4712 Other spondylosis with myelopathy, cervical region: Secondary | ICD-10-CM | POA: Diagnosis not present

## 2022-11-15 DIAGNOSIS — M5459 Other low back pain: Secondary | ICD-10-CM | POA: Diagnosis not present

## 2022-11-15 DIAGNOSIS — Z1389 Encounter for screening for other disorder: Secondary | ICD-10-CM | POA: Diagnosis not present

## 2022-11-15 DIAGNOSIS — M1990 Unspecified osteoarthritis, unspecified site: Secondary | ICD-10-CM | POA: Diagnosis not present

## 2022-11-16 DIAGNOSIS — G4733 Obstructive sleep apnea (adult) (pediatric): Secondary | ICD-10-CM | POA: Diagnosis not present

## 2022-12-17 DIAGNOSIS — G4733 Obstructive sleep apnea (adult) (pediatric): Secondary | ICD-10-CM | POA: Diagnosis not present

## 2023-01-16 DIAGNOSIS — G4733 Obstructive sleep apnea (adult) (pediatric): Secondary | ICD-10-CM | POA: Diagnosis not present

## 2023-02-09 DIAGNOSIS — M5416 Radiculopathy, lumbar region: Secondary | ICD-10-CM | POA: Diagnosis not present

## 2023-02-16 DIAGNOSIS — G4733 Obstructive sleep apnea (adult) (pediatric): Secondary | ICD-10-CM | POA: Diagnosis not present

## 2023-02-17 DIAGNOSIS — G4733 Obstructive sleep apnea (adult) (pediatric): Secondary | ICD-10-CM | POA: Diagnosis not present

## 2023-03-13 DIAGNOSIS — M1990 Unspecified osteoarthritis, unspecified site: Secondary | ICD-10-CM | POA: Diagnosis not present

## 2023-03-13 DIAGNOSIS — Z8249 Family history of ischemic heart disease and other diseases of the circulatory system: Secondary | ICD-10-CM | POA: Diagnosis not present

## 2023-03-13 DIAGNOSIS — Z809 Family history of malignant neoplasm, unspecified: Secondary | ICD-10-CM | POA: Diagnosis not present

## 2023-03-13 DIAGNOSIS — I10 Essential (primary) hypertension: Secondary | ICD-10-CM | POA: Diagnosis not present

## 2023-03-13 DIAGNOSIS — M545 Low back pain, unspecified: Secondary | ICD-10-CM | POA: Diagnosis not present

## 2023-03-13 DIAGNOSIS — E785 Hyperlipidemia, unspecified: Secondary | ICD-10-CM | POA: Diagnosis not present

## 2023-03-13 DIAGNOSIS — G4733 Obstructive sleep apnea (adult) (pediatric): Secondary | ICD-10-CM | POA: Diagnosis not present

## 2023-03-13 DIAGNOSIS — M62838 Other muscle spasm: Secondary | ICD-10-CM | POA: Diagnosis not present

## 2023-03-13 DIAGNOSIS — G629 Polyneuropathy, unspecified: Secondary | ICD-10-CM | POA: Diagnosis not present

## 2023-03-19 DIAGNOSIS — G4733 Obstructive sleep apnea (adult) (pediatric): Secondary | ICD-10-CM | POA: Diagnosis not present

## 2023-04-18 DIAGNOSIS — G4733 Obstructive sleep apnea (adult) (pediatric): Secondary | ICD-10-CM | POA: Diagnosis not present

## 2023-04-19 DIAGNOSIS — H52202 Unspecified astigmatism, left eye: Secondary | ICD-10-CM | POA: Diagnosis not present

## 2023-04-19 DIAGNOSIS — H04123 Dry eye syndrome of bilateral lacrimal glands: Secondary | ICD-10-CM | POA: Diagnosis not present

## 2023-04-19 DIAGNOSIS — Z961 Presence of intraocular lens: Secondary | ICD-10-CM | POA: Diagnosis not present

## 2023-05-15 DIAGNOSIS — L570 Actinic keratosis: Secondary | ICD-10-CM | POA: Diagnosis not present

## 2023-05-15 DIAGNOSIS — X32XXXD Exposure to sunlight, subsequent encounter: Secondary | ICD-10-CM | POA: Diagnosis not present

## 2023-05-15 DIAGNOSIS — L308 Other specified dermatitis: Secondary | ICD-10-CM | POA: Diagnosis not present

## 2023-05-15 DIAGNOSIS — D225 Melanocytic nevi of trunk: Secondary | ICD-10-CM | POA: Diagnosis not present

## 2023-05-15 DIAGNOSIS — Z1283 Encounter for screening for malignant neoplasm of skin: Secondary | ICD-10-CM | POA: Diagnosis not present

## 2023-05-19 DIAGNOSIS — G4733 Obstructive sleep apnea (adult) (pediatric): Secondary | ICD-10-CM | POA: Diagnosis not present

## 2023-06-18 DIAGNOSIS — G4733 Obstructive sleep apnea (adult) (pediatric): Secondary | ICD-10-CM | POA: Diagnosis not present

## 2023-07-19 DIAGNOSIS — G4733 Obstructive sleep apnea (adult) (pediatric): Secondary | ICD-10-CM | POA: Diagnosis not present

## 2023-07-25 ENCOUNTER — Telehealth: Payer: Self-pay | Admitting: *Deleted

## 2023-07-25 NOTE — Telephone Encounter (Signed)
 Received Rx request from [facility] for CPAP supplies. We haven't seen patient since March 2024 and she has no pending appt here. I called the pt and LVM asking for call back asap to schedule in next available opening with either Duwaine NP or Dr Buck. Left office number.

## 2023-11-23 DIAGNOSIS — M79674 Pain in right toe(s): Secondary | ICD-10-CM | POA: Diagnosis not present

## 2023-11-23 DIAGNOSIS — E785 Hyperlipidemia, unspecified: Secondary | ICD-10-CM | POA: Diagnosis not present

## 2023-11-23 DIAGNOSIS — G4733 Obstructive sleep apnea (adult) (pediatric): Secondary | ICD-10-CM | POA: Diagnosis not present

## 2023-11-23 DIAGNOSIS — I10 Essential (primary) hypertension: Secondary | ICD-10-CM | POA: Diagnosis not present

## 2023-12-14 DIAGNOSIS — I10 Essential (primary) hypertension: Secondary | ICD-10-CM | POA: Diagnosis not present

## 2023-12-14 DIAGNOSIS — E785 Hyperlipidemia, unspecified: Secondary | ICD-10-CM | POA: Diagnosis not present

## 2023-12-19 DIAGNOSIS — Z13820 Encounter for screening for osteoporosis: Secondary | ICD-10-CM | POA: Diagnosis not present

## 2023-12-19 DIAGNOSIS — Z1231 Encounter for screening mammogram for malignant neoplasm of breast: Secondary | ICD-10-CM | POA: Diagnosis not present

## 2023-12-19 DIAGNOSIS — Z0000 Encounter for general adult medical examination without abnormal findings: Secondary | ICD-10-CM | POA: Diagnosis not present

## 2023-12-19 DIAGNOSIS — I10 Essential (primary) hypertension: Secondary | ICD-10-CM | POA: Diagnosis not present

## 2023-12-19 DIAGNOSIS — E785 Hyperlipidemia, unspecified: Secondary | ICD-10-CM | POA: Diagnosis not present

## 2023-12-19 DIAGNOSIS — M1732 Unilateral post-traumatic osteoarthritis, left knee: Secondary | ICD-10-CM | POA: Diagnosis not present

## 2024-01-31 DIAGNOSIS — Z1231 Encounter for screening mammogram for malignant neoplasm of breast: Secondary | ICD-10-CM | POA: Diagnosis not present

## 2024-01-31 DIAGNOSIS — Z780 Asymptomatic menopausal state: Secondary | ICD-10-CM | POA: Diagnosis not present

## 2024-01-31 DIAGNOSIS — Z13820 Encounter for screening for osteoporosis: Secondary | ICD-10-CM | POA: Diagnosis not present

## 2024-02-28 DIAGNOSIS — L578 Other skin changes due to chronic exposure to nonionizing radiation: Secondary | ICD-10-CM | POA: Diagnosis not present

## 2024-02-28 DIAGNOSIS — L820 Inflamed seborrheic keratosis: Secondary | ICD-10-CM | POA: Diagnosis not present

## 2024-02-28 DIAGNOSIS — L570 Actinic keratosis: Secondary | ICD-10-CM | POA: Diagnosis not present

## 2024-02-28 DIAGNOSIS — L718 Other rosacea: Secondary | ICD-10-CM | POA: Diagnosis not present

## 2024-05-06 DIAGNOSIS — J069 Acute upper respiratory infection, unspecified: Secondary | ICD-10-CM | POA: Diagnosis not present
